# Patient Record
Sex: Female | Born: 1975 | Race: White | Hispanic: No | Marital: Single | Smoking: Former smoker
Health system: Southern US, Community
[De-identification: ages and names within clinical notes are randomized; demographics above are authoritative.]

## PROBLEM LIST (undated history)

## (undated) DIAGNOSIS — F1910 Other psychoactive substance abuse, uncomplicated: Secondary | ICD-10-CM

## (undated) DIAGNOSIS — M869 Osteomyelitis, unspecified: Secondary | ICD-10-CM

## (undated) HISTORY — PX: INCISION AND DRAINAGE FOREARM / WRIST DEEP: SUR675

## (undated) HISTORY — PX: BACK SURGERY: SHX140

---

## 2016-08-10 ENCOUNTER — Emergency Department
Admission: EM | Admit: 2016-08-10 | Discharge: 2016-08-10 | Disposition: A | Discharge status: Home / Self Care | Payer: PRIVATE HEALTH INSURANCE | Attending: Emergency Medicine | Admitting: Emergency Medicine

## 2016-08-10 ENCOUNTER — Encounter: Payer: Self-pay | Admitting: Emergency Medicine

## 2016-08-10 ENCOUNTER — Emergency Department: Payer: PRIVATE HEALTH INSURANCE

## 2016-08-10 DIAGNOSIS — M79643 Pain in unspecified hand: Secondary | ICD-10-CM

## 2016-08-10 DIAGNOSIS — F17210 Nicotine dependence, cigarettes, uncomplicated: Secondary | ICD-10-CM | POA: Diagnosis not present

## 2016-08-10 DIAGNOSIS — L02511 Cutaneous abscess of right hand: Secondary | ICD-10-CM | POA: Insufficient documentation

## 2016-08-10 DIAGNOSIS — L0291 Cutaneous abscess, unspecified: Secondary | ICD-10-CM

## 2016-08-10 DIAGNOSIS — R2231 Localized swelling, mass and lump, right upper limb: Secondary | ICD-10-CM | POA: Diagnosis present

## 2016-08-10 LAB — COMPREHENSIVE METABOLIC PANEL
ALT: 13 U/L — ABNORMAL LOW (ref 14–54)
AST: 22 U/L (ref 15–41)
Albumin: 3.6 g/dL (ref 3.5–5.0)
Alkaline Phosphatase: 70 U/L (ref 38–126)
Anion gap: 9 (ref 5–15)
BUN: 7 mg/dL (ref 6–20)
CO2: 27 mmol/L (ref 22–32)
Calcium: 9.5 mg/dL (ref 8.9–10.3)
Chloride: 102 mmol/L (ref 101–111)
Creatinine, Ser: 0.7 mg/dL (ref 0.44–1.00)
GFR calc Af Amer: >60 mL/min (ref >60)
GFR calc non Af Amer: >60 mL/min (ref >60)
Glucose, Bld: 103 mg/dL — ABNORMAL HIGH (ref 65–99)
Potassium: 4.2 mmol/L (ref 3.5–5.1)
Sodium: 138 mmol/L (ref 135–145)
Total Bilirubin: 0.5 mg/dL (ref 0.3–1.2)
Total Protein: 7.7 g/dL (ref 6.5–8.1)

## 2016-08-10 LAB — CBC WITH DIFFERENTIAL/PLATELET
Basophils Absolute: 0.1 10*3/uL (ref 0–0.1)
Basophils Relative: 1 %
Eosinophils Absolute: 0.1 10*3/uL (ref 0–0.7)
Eosinophils Relative: 2 %
HCT: 40.6 % (ref 35.0–47.0)
Hemoglobin: 14 g/dL (ref 12.0–16.0)
Lymphocytes Relative: 23 %
Lymphs Abs: 1.8 10*3/uL (ref 1.0–3.6)
MCH: 29 pg (ref 26.0–34.0)
MCHC: 34.5 g/dL (ref 32.0–36.0)
MCV: 84.3 fL (ref 80.0–100.0)
Monocytes Absolute: 0.8 10*3/uL (ref 0.2–0.9)
Monocytes Relative: 10 %
Neutro Abs: 5.2 10*3/uL (ref 1.4–6.5)
Neutrophils Relative %: 64 %
Platelets: 163 10*3/uL (ref 150–440)
RBC: 4.82 MIL/uL (ref 3.80–5.20)
RDW: 14 % (ref 11.5–14.5)
WBC: 7.9 10*3/uL (ref 3.6–11.0)

## 2016-08-10 MED ORDER — CLINDAMYCIN PHOSPHATE 600 MG/50ML IV SOLN
600.0000 mg | Freq: Once | INTRAVENOUS | Status: AC
  Administered 2016-08-10: 600 mg via INTRAVENOUS
  Filled 2016-08-10: qty 50

## 2016-08-10 MED ORDER — OXYCODONE-ACETAMINOPHEN 5-325 MG PO TABS
1.0000 | ORAL_TABLET | Freq: Once | ORAL | Status: AC
  Administered 2016-08-10: 1 via ORAL
  Filled 2016-08-10: qty 1

## 2016-08-10 MED ORDER — ONDANSETRON HCL 4 MG/2ML IJ SOLN
4.0000 mg | Freq: Once | INTRAMUSCULAR | Status: AC
  Administered 2016-08-10: 4 mg via INTRAVENOUS
  Filled 2016-08-10: qty 2

## 2016-08-10 MED ORDER — SULFAMETHOXAZOLE-TRIMETHOPRIM 800-160 MG PO TABS: 1.0000 | ORAL_TABLET | Freq: Two times a day (BID) | ORAL | 0 refills | Status: AC

## 2016-08-10 MED ORDER — FLUCONAZOLE 150 MG PO TABS: 150.0000 mg | ORAL_TABLET | Freq: Every day | ORAL | 0 refills | Status: AC

## 2016-08-10 MED ORDER — LIDOCAINE HCL 1 % IJ SOLN: 5.0000 mL | Freq: Once | INTRAMUSCULAR | Status: DC

## 2016-08-10 MED ORDER — LIDOCAINE HCL (PF) 1 % IJ SOLN: 5.0000 mL | Freq: Once | INTRAMUSCULAR | Status: DC

## 2016-08-10 MED ORDER — LIDOCAINE HCL (PF) 1 % IJ SOLN
INTRAMUSCULAR | Status: AC
  Filled 2016-08-10: qty 5

## 2016-08-10 MED ORDER — CLINDAMYCIN HCL 300 MG PO CAPS: 300.0000 mg | ORAL_CAPSULE | Freq: Three times a day (TID) | ORAL | 0 refills | Status: AC

## 2016-08-10 NOTE — ED Provider Notes (Signed)
[facility] Emergency Department Provider Note  ____________________________________________  Time seen: Approximately 4:15 PM  I have reviewed the triage vital signs and the nursing notes.   HISTORY  Chief Complaint Abscess    HPI Noni Stonesifer is a 41 y.o. female presents to the emergency department with edema and cellulitis of the right hand with a 3 cm x 3 cm indurated and fluctuant abscess that has been apparent for one day only. Patient states that she did not have symptoms last night. 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 She denies intravenous drug use. She has had some nausea but no vomiting. Patient denies associated chest pain, chest tightness and abdominal pain. She denies a history of cutaneous abscesses or cellulitis in the past. She had chills but has been afebrile.   History reviewed. No pertinent past medical history.  There are no active problems to display for this patient.   History reviewed. No pertinent surgical history.  Prior to Admission medications   Medication Sig Start Date End Date Taking? Authorizing Provider  clindamycin (CLEOCIN) 300 MG capsule Take 1 capsule (300 mg total) by mouth 3 (three) times daily. 08/10/16 08/20/16  Orvil Feil, PA-C  fluconazole (DIFLUCAN) 150 MG tablet Take 1 tablet (150 mg total) by mouth daily. 08/10/16 08/11/16  Orvil Feil, PA-C  sulfamethoxazole-trimethoprim (BACTRIM DS,SEPTRA DS) 800-160 MG tablet Take 1 tablet by mouth 2 (two) times daily. 08/10/16 08/17/16  Orvil Feil, PA-C    Allergies Patient has no known allergies.  No family history on file.  Social History Social History  Substance Use Topics  . Smoking status: Current Every Day Smoker    Types: Cigarettes  . Smokeless tobacco: Never Used  . Alcohol use No     Review of Systems  Constitutional: Patient has chills. Eyes: No visual changes. No discharge ENT: No upper respiratory complaints. Cardiovascular: no  chest pain. Respiratory: no cough. No SOB. Gastrointestinal: No abdominal pain. She has had nausea but no vomiting. Musculoskeletal: Negative for musculoskeletal pain. Skin: Patient has cellulitis and edema of the right hand with possible abscess. Neurological: Negative for headaches, focal weakness or numbness.   ____________________________________________   PHYSICAL EXAM:  VITAL SIGNS: ED Triage Vitals  Enc Vitals Group     BP 08/10/16 1540 (!) 117/96     Pulse Rate 08/10/16 1540 77     Resp 08/10/16 1540 16     Temp 08/10/16 1540 98.1 F (36.7 C)     Temp Source 08/10/16 1540 Oral     SpO2 08/10/16 1540 98 %     Weight 08/10/16 1539 125 lb (56.7 kg)     Height 08/10/16 1539 5\' 7"  (1.702 m)     Head Circumference --      Peak Flow --      Pain Score 08/10/16 1539 8     Pain Loc --      Pain Edu? --      Excl. in GC? --      Constitutional: Alert and oriented. Well appearing and in no acute distress. Eyes: Conjunctivae are normal. PERRL. EOMI. Head: Atraumatic. Cardiovascular: Normal rate, regular rhythm. Normal S1 and S2.  Good peripheral circulation. Respiratory: Normal respiratory effort without tachypnea or retractions. Lungs CTAB. Good air entry to the bases with no decreased or absent breath sounds. Musculoskeletal: Full range of motion to all extremities. No gross deformities appreciated. Neurologic:  Normal speech and language. No gross focal neurologic deficits are appreciated.  Skin: Patient has a 3 cm x 3 cm palpable abscess of the right hand with surrounding cellulitis and edema. Palpable radial and ulnar pulses bilaterally and symmetrically. Psychiatric: Mood and affect are normal. Speech and behavior are normal. Patient exhibits appropriate insight and judgement.   ____________________________________________   LABS (all labs ordered are listed, but only abnormal results are displayed)  Labs Reviewed  COMPREHENSIVE METABOLIC PANEL - Abnormal; Notable  for the following:       Result Value   Glucose, Bld 103 (*)    ALT 13 (*)    All other components within normal limits  CBC WITH DIFFERENTIAL/PLATELET   ____________________________________________  EKG   ____________________________________________  RADIOLOGY Geraldo PitterI, Tasean Mancha M Carlicia Leavens, personally viewed and evaluated these images (plain radiographs) as part of my medical decision making, as well as reviewing the written report by the radiologist.    [place] Rt Upper Extrem Ltd Soft Tissue Non Vascular  Result Date: 08/10/2016 CLINICAL DATA:  Right hand soreness and swelling beginning yesterday. EXAM: ULTRASOUND RIGHT HAND LIMITED TECHNIQUE: Ultrasound examination of the upper extremity soft tissues was performed in the area of clinical concern. COMPARISON:  None. FINDINGS: Scanning was directed to the region of concern on the dorsal aspect of the right hand at approximately the level of the fourth and fifth metacarpals. A well-circumscribed lesion which is predominantly hypoechoic is identified which measures 2.5 cm craniocaudal by 1.0 cm AP by 1.9 cm transverse. Doppler imaging demonstrates flow about but not within the lesion. IMPRESSION: Well-circumscribed lesion on the dorsum of the right hand could be a hematoma, abscess or soft tissue mass without internal blood flow. It should be amenable to needle aspiration. Electronically Signed   By: Drusilla Kannerhomas  Dalessio M.D.   On: 08/10/2016 16:58    ____________________________________________    PROCEDURES  Procedure(s) performed:    Procedures    Medications  lidocaine (PF) (XYLOCAINE) 1 % injection 5 mL (not administered)  clindamycin (CLEOCIN) IVPB 600 mg (0 mg Intravenous Stopped 08/10/16 1740)  oxyCODONE-acetaminophen (PERCOCET/ROXICET) 5-325 MG per tablet 1 tablet (1 tablet Oral Given 08/10/16 1655)  ondansetron (ZOFRAN) injection 4 mg (4 mg Intravenous Given 08/10/16 1702)   INCISION AND DRAINAGE Performed by: Orvil FeilJaclyn M Kaedence Connelly Consent:  Verbal consent obtained. Risks and benefits: risks, benefits and alternatives were discussed Type: abscess  Body area: Right hand  Anesthesia: local infiltration  Incision was made with a scalpel.  Local anesthetic: lidocaine 1% without epinephrine  Anesthetic total: 3 ml  Complexity: complex Blunt dissection to break up loculations  Drainage: purulent  Drainage amount: 5 cc  Packing material: 1/4 in iodoform gauze  Patient tolerance: Patient tolerated the procedure well with no immediate complications.     ____________________________________________   INITIAL IMPRESSION / ASSESSMENT AND PLAN / ED COURSE  Pertinent labs & imaging results that were available during my care of the patient were reviewed by me and considered in my medical decision making (see chart for details).  Review of the [place] CSRS was performed in accordance of the [facility] prior to dispensing any controlled drugs.     Assessment and plan: Hand Pain,  Abscess  Patient presents to the emergency department with cellulitis of the right hand, edema of the right hand and a 3 cm x 3 cm palpable abscess at the dorsal aspect of the right hand. Im clindamycin was administered in the emergency department. CBC and CMP were reassuring. Patient underwent incision and drainage without complication. Patient was advised to have packing removed by  primary care in three days. Patient was discharged with both Bactrim and Clindamycin. Patient was advised to follow-up with primary care as needed. All patient questions were answered. Vital signs are reassuring prior to discharge.  ____________________________________________  FINAL CLINICAL IMPRESSION(S) / ED DIAGNOSES  Final diagnoses:  Hand pain  Abscess      NEW MEDICATIONS STARTED DURING THIS VISIT:  New Prescriptions   CLINDAMYCIN (CLEOCIN) 300 MG CAPSULE    Take 1 capsule (300 mg total) by mouth 3 (three) times daily.   FLUCONAZOLE (DIFLUCAN) 150 MG TABLET     Take 1 tablet (150 mg total) by mouth daily.   SULFAMETHOXAZOLE-TRIMETHOPRIM (BACTRIM DS,SEPTRA DS) 800-160 MG TABLET    Take 1 tablet by mouth 2 (two) times daily.        This chart was dictated using voice recognition software/Dragon. Despite best efforts to proofread, errors can occur which can change the meaning. Any change was purely unintentional.    Orvil Feil, PA-C 08/10/16 Gwenyth Bender, MD 08/10/16 1910

## 2016-08-10 NOTE — ED Triage Notes (Signed)
Abscess to right hand x 1 day.  STates has an allergy to the sanitizer at [employer].  Noticed that existing scar on right had was sore and then swelling started yesterday.

## 2016-12-09 ENCOUNTER — Other Ambulatory Visit: Payer: Self-pay

## 2016-12-09 ENCOUNTER — Emergency Department: Payer: Medicaid - Out of State

## 2016-12-09 ENCOUNTER — Emergency Department
Admission: EM | Admit: 2016-12-09 | Discharge: 2016-12-09 | Disposition: A | Discharge status: Home / Self Care | Payer: Medicaid - Out of State | Attending: Emergency Medicine | Admitting: Emergency Medicine

## 2016-12-09 DIAGNOSIS — L0889 Other specified local infections of the skin and subcutaneous tissue: Secondary | ICD-10-CM | POA: Diagnosis not present

## 2016-12-09 DIAGNOSIS — B958 Unspecified staphylococcus as the cause of diseases classified elsewhere: Secondary | ICD-10-CM | POA: Insufficient documentation

## 2016-12-09 DIAGNOSIS — F17210 Nicotine dependence, cigarettes, uncomplicated: Secondary | ICD-10-CM | POA: Diagnosis not present

## 2016-12-09 DIAGNOSIS — M85661 Other cyst of bone, right lower leg: Secondary | ICD-10-CM | POA: Diagnosis not present

## 2016-12-09 DIAGNOSIS — L089 Local infection of the skin and subcutaneous tissue, unspecified: Secondary | ICD-10-CM

## 2016-12-09 DIAGNOSIS — R21 Rash and other nonspecific skin eruption: Secondary | ICD-10-CM | POA: Diagnosis present

## 2016-12-09 LAB — CBC WITH DIFFERENTIAL/PLATELET
BASOS ABS: 0.1 10*3/uL (ref 0–0.1)
Basophils Relative: 1 %
EOS PCT: 5 %
Eosinophils Absolute: 0.3 10*3/uL (ref 0–0.7)
HEMATOCRIT: 36.4 % (ref 35.0–47.0)
HEMOGLOBIN: 12 g/dL (ref 12.0–16.0)
LYMPHS ABS: 1.9 10*3/uL (ref 1.0–3.6)
LYMPHS PCT: 31 %
MCH: 28 pg (ref 26.0–34.0)
MCHC: 33.1 g/dL (ref 32.0–36.0)
MCV: 84.6 fL (ref 80.0–100.0)
Monocytes Absolute: 0.5 10*3/uL (ref 0.2–0.9)
Monocytes Relative: 8 %
NEUTROS ABS: 3.5 10*3/uL (ref 1.4–6.5)
Neutrophils Relative %: 55 %
Platelets: 154 10*3/uL (ref 150–440)
RBC: 4.3 MIL/uL (ref 3.80–5.20)
RDW: 14.3 % (ref 11.5–14.5)
WBC: 6.2 10*3/uL (ref 3.6–11.0)

## 2016-12-09 LAB — COMPREHENSIVE METABOLIC PANEL
ALBUMIN: 3.7 g/dL (ref 3.5–5.0)
ALT: 15 U/L (ref 14–54)
ANION GAP: 9 (ref 5–15)
AST: 24 U/L (ref 15–41)
Alkaline Phosphatase: 59 U/L (ref 38–126)
BILIRUBIN TOTAL: 0.4 mg/dL (ref 0.3–1.2)
BUN: 12 mg/dL (ref 6–20)
CHLORIDE: 99 mmol/L — AB (ref 101–111)
CO2: 26 mmol/L (ref 22–32)
Calcium: 9 mg/dL (ref 8.9–10.3)
Creatinine, Ser: 0.82 mg/dL (ref 0.44–1.00)
GFR calc Af Amer: >60 mL/min (ref >60)
Glucose, Bld: 158 mg/dL — ABNORMAL HIGH (ref 65–99)
POTASSIUM: 3.7 mmol/L (ref 3.5–5.1)
Sodium: 134 mmol/L — ABNORMAL LOW (ref 135–145)
TOTAL PROTEIN: 7.1 g/dL (ref 6.5–8.1)

## 2016-12-09 LAB — LACTIC ACID, PLASMA: Lactic Acid, Venous: 1.2 mmol/L (ref 0.5–1.9)

## 2016-12-09 LAB — URINALYSIS, COMPLETE (UACMP) WITH MICROSCOPIC
BILIRUBIN URINE: NEGATIVE
Bacteria, UA: NONE SEEN
Glucose, UA: NEGATIVE mg/dL
HGB URINE DIPSTICK: NEGATIVE
Ketones, ur: NEGATIVE mg/dL
LEUKOCYTES UA: NEGATIVE
NITRITE: NEGATIVE
PH: 8 (ref 5.0–8.0)
PROTEIN: NEGATIVE mg/dL
Specific Gravity, Urine: 1.014 (ref 1.005–1.030)
WBC UA: NONE SEEN WBC/hpf (ref 0–5)

## 2016-12-09 LAB — URINE DRUG SCREEN, QUALITATIVE (ARMC ONLY)
AMPHETAMINES, UR SCREEN: NOT DETECTED
BENZODIAZEPINE, UR SCRN: NOT DETECTED
Barbiturates, Ur Screen: NOT DETECTED
COCAINE METABOLITE, UR ~~LOC~~: POSITIVE — AB
Cannabinoid 50 Ng, Ur ~~LOC~~: NOT DETECTED
MDMA (ECSTASY) UR SCREEN: NOT DETECTED
METHADONE SCREEN, URINE: NOT DETECTED
OPIATE, UR SCREEN: NOT DETECTED
PHENCYCLIDINE (PCP) UR S: NOT DETECTED
Tricyclic, Ur Screen: NOT DETECTED

## 2016-12-09 LAB — ACETAMINOPHEN LEVEL: Acetaminophen (Tylenol), Serum: <10 ug/mL — ABNORMAL LOW (ref 10–30)

## 2016-12-09 LAB — SALICYLATE LEVEL: Salicylate Lvl: <7 mg/dL (ref 2.8–30.0)

## 2016-12-09 LAB — ETHANOL: Alcohol, Ethyl (B): <10 mg/dL (ref <10)

## 2016-12-09 LAB — POCT PREGNANCY, URINE: PREG TEST UR: NEGATIVE

## 2016-12-09 IMAGING — CT CT MAXILLOFACIAL W/ CM
3 of 4 series · 16 of 47 positions shown, 19 images · IV contrast (iopamidol)
Comparison: None.

CLINICAL DATA: Facial swelling

EXAM:
CT MAXILLOFACIAL WITH CONTRAST
TECHNIQUE: Multidetector CT imaging of the maxillofacial structures was
performed with intravenous contrast. Multiplanar CT image
reconstructions were also generated.
CONTRAST:  75mL [KB] IOPAMIDOL ([KB]) INJECTION 61%

[Series 2: max soft · axial · 0.35mm/px · z∈[-258,-118]mm · 10 of 82 slices shown, 13 images]
[im 6/82  brain]
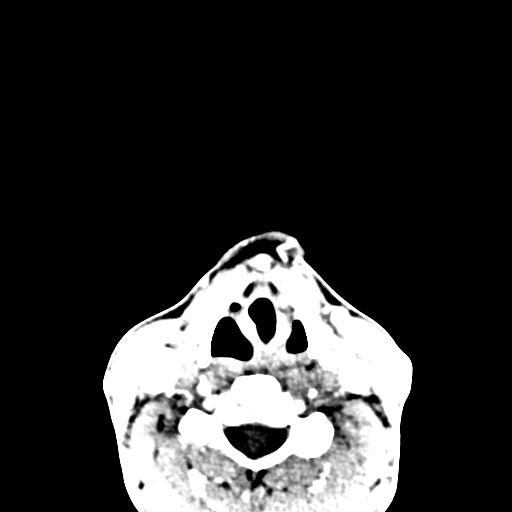
[im 6/82  bone]
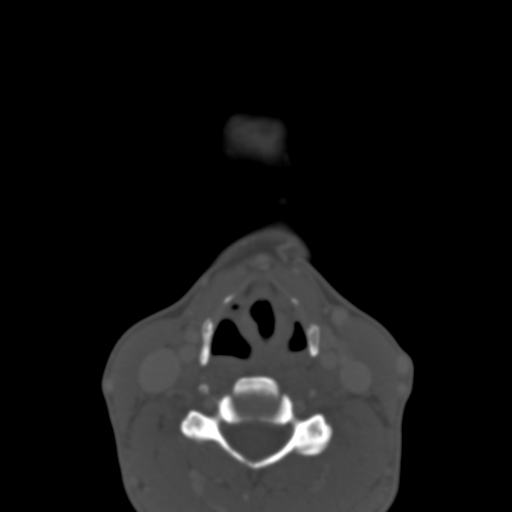
[im 14/82  bone]
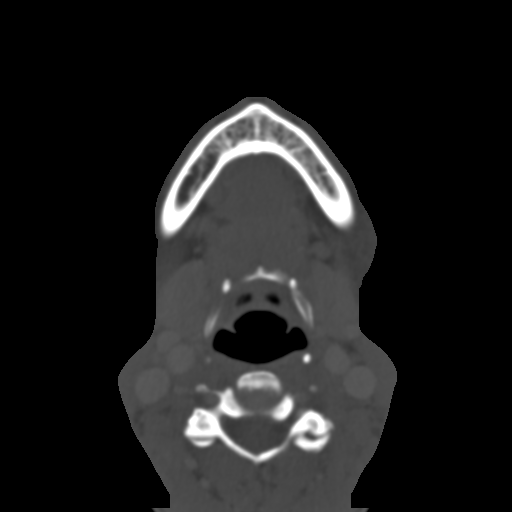
[im 23/82  bone]
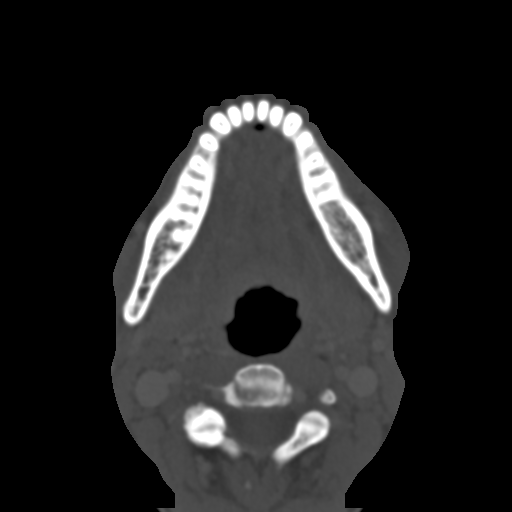
[im 28/82  bone]
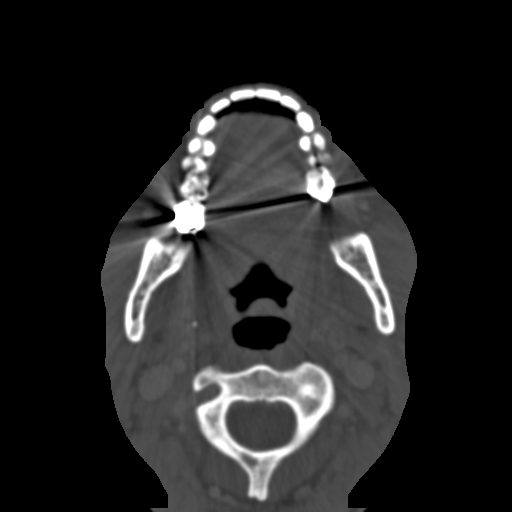
[im 37/82  brain]
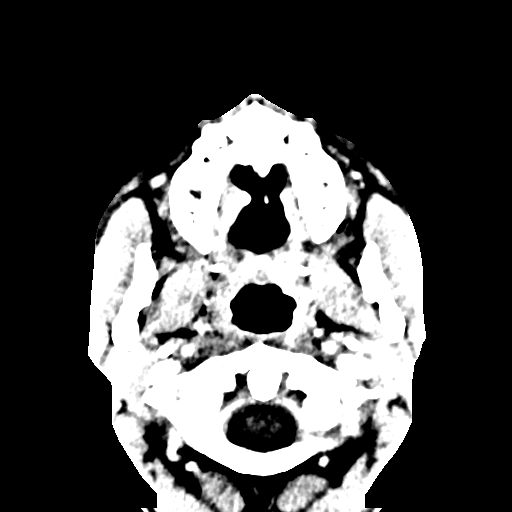
[im 37/82  bone]
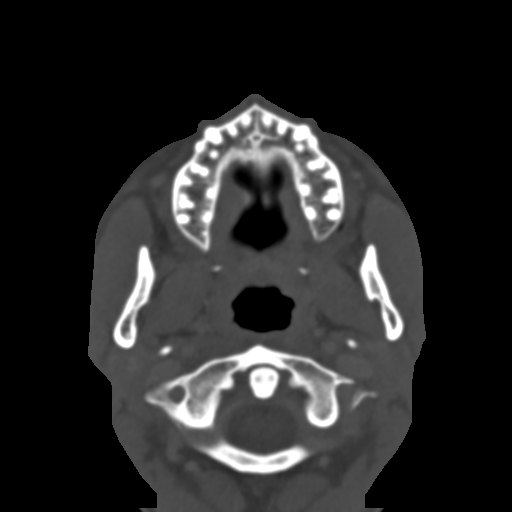
[im 45/82  bone]
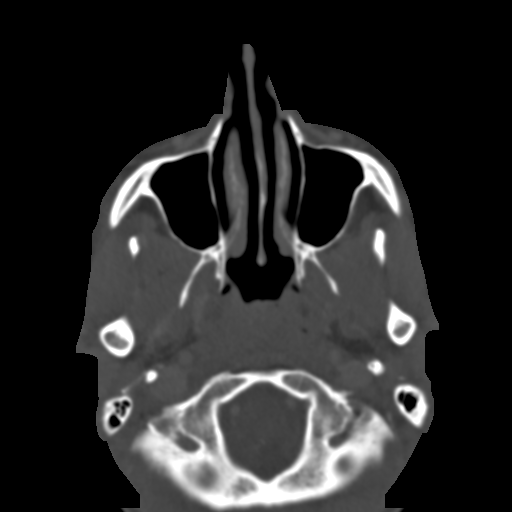
[im 54/82  bone]
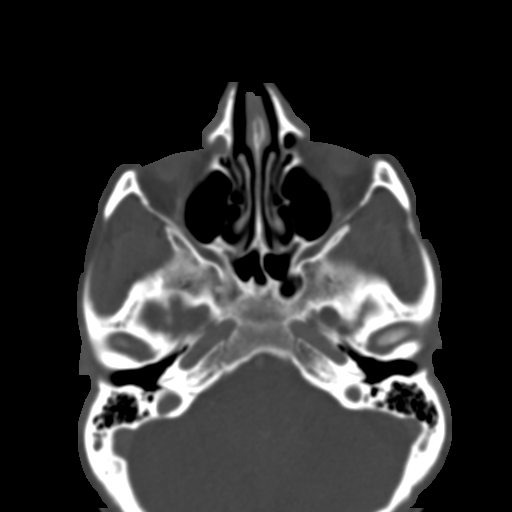
[im 62/82  bone]
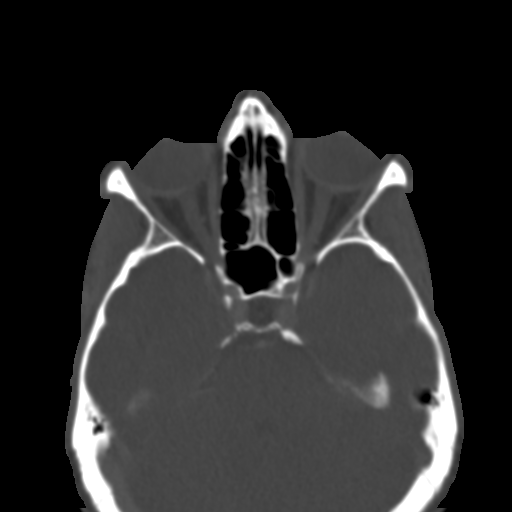
[im 68/82  brain]
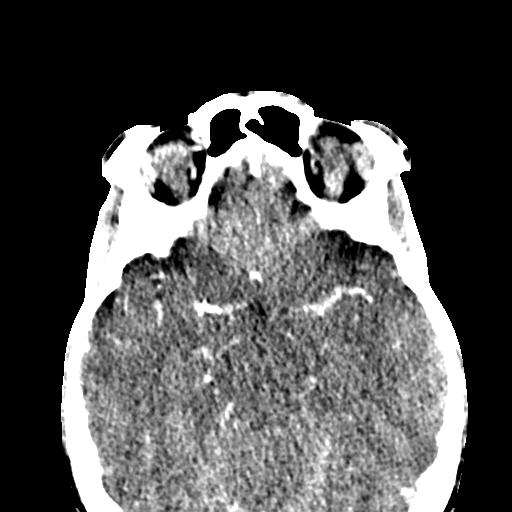
[im 68/82  bone]
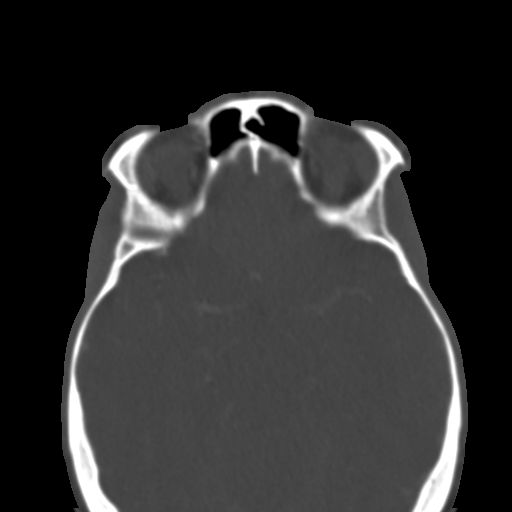
[im 76/82  bone]
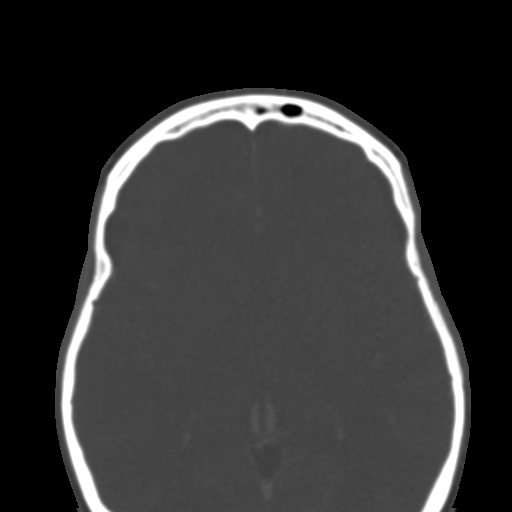

[Series 5: sagittal soft · sagittal · 0.31mm/px · 3 of 73 slices shown]
[im 25/73  bone]
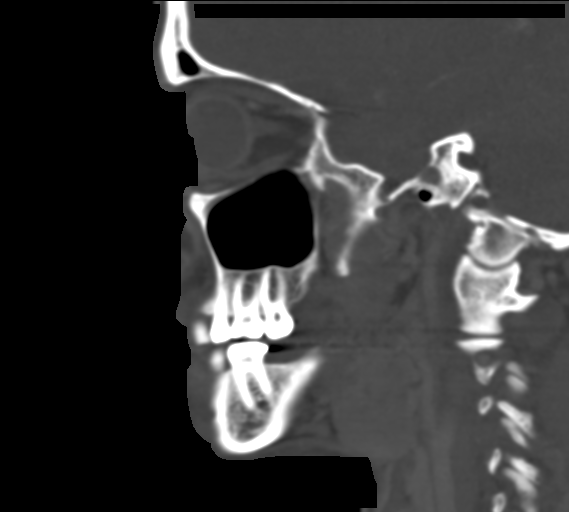
[im 37/73  bone]
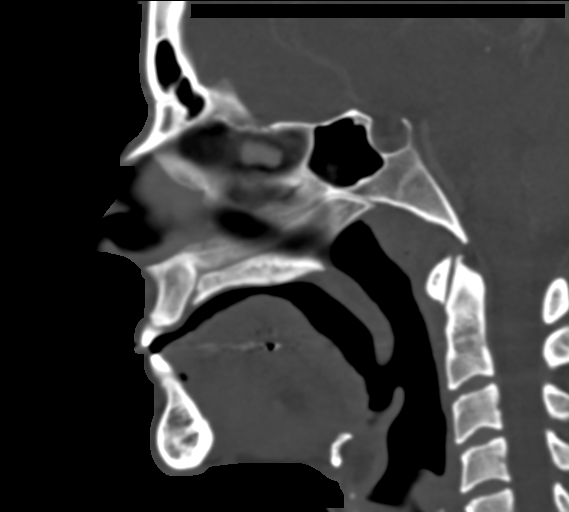
[im 49/73  bone]
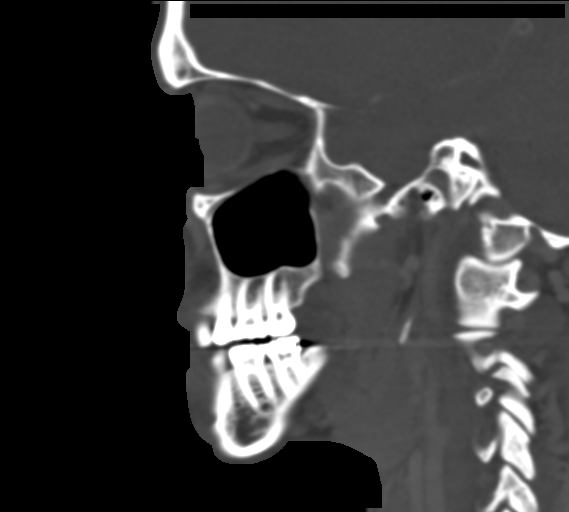

[Series 6: coronal bone · coronal · 0.31mm/px · 3 of 79 slices shown]
[im 20/79  bone]
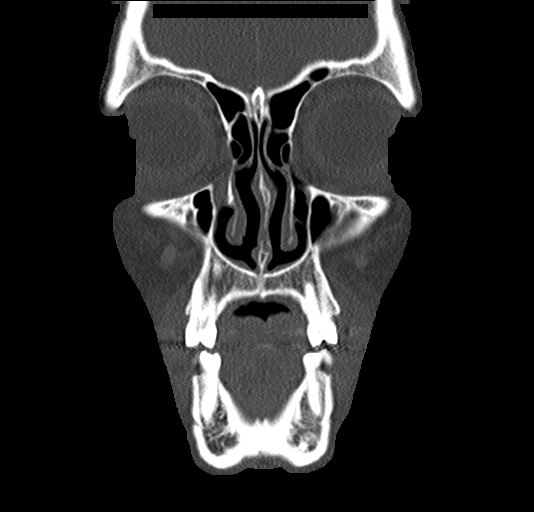
[im 40/79  bone]
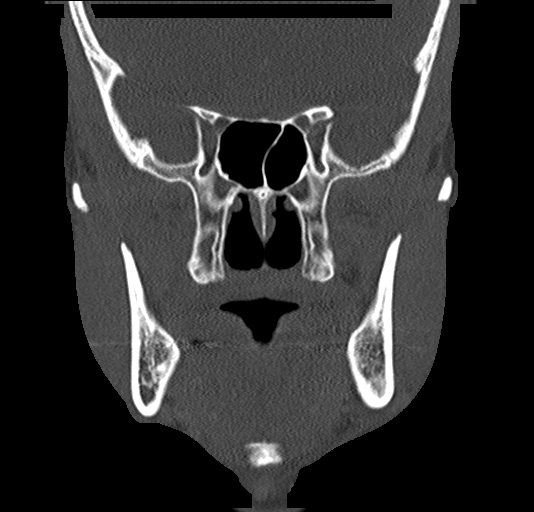
[im 59/79  bone]
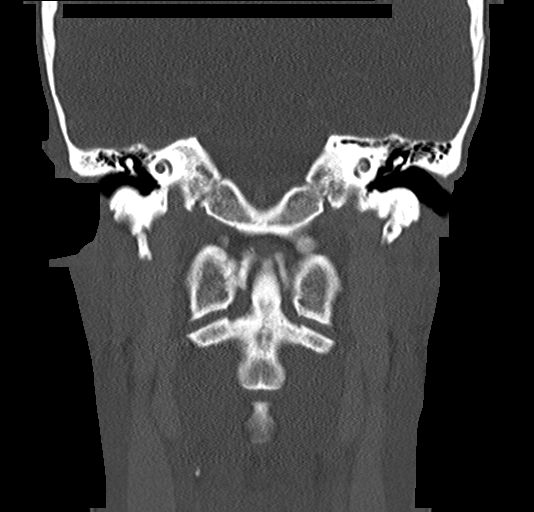

[16 of 47 positions shown; findings below may reference images not displayed]

FINDINGS: Osseous:  No acute fracture.

Orbits: The globes appear intact. Normal appearance of the intra-
and extraconal fat. Symmetric extraocular muscles.

Sinuses: No fluid levels or advanced mucosal thickening.

Soft tissues: Inflammatory change in the subcutaneous fat with
overlying skin thickening at the left lower face, superficial to the
angle of the mandible and at the right face, superficial to the
zygomatic arch. There is no associated fluid collection or abscess.

Limited intracranial: Normal.
IMPRESSION: Focal areas of superficial facial inflammation overlying the right
zygomatic arch and left mandibular angle. No associated abscess or
fluid collection.

## 2016-12-09 IMAGING — DX DG KNEE COMPLETE 4+V*R*
4 series · 4 of 4 positions shown · non-contrast
Comparison: None.

CLINICAL DATA: 40-year-old female with right knee pain.

EXAM:
RIGHT KNEE - COMPLETE 4+ VIEW

[knee ap]
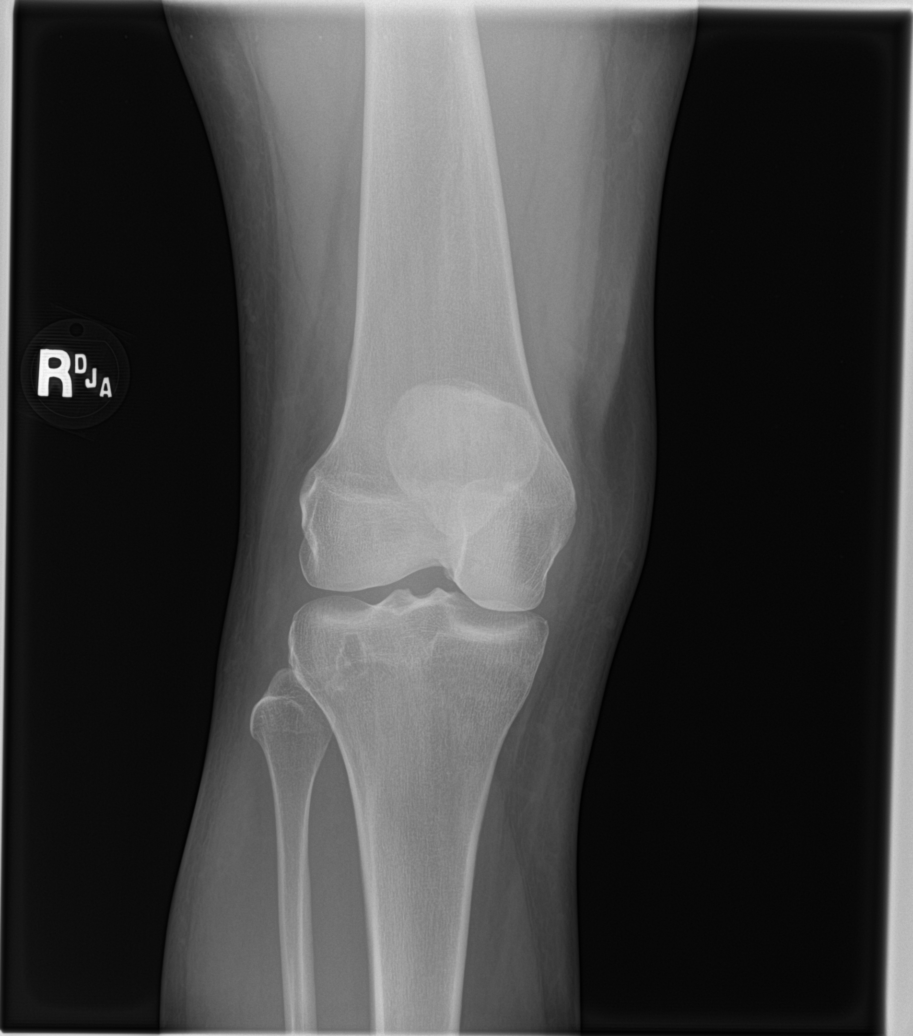

[knee lat]
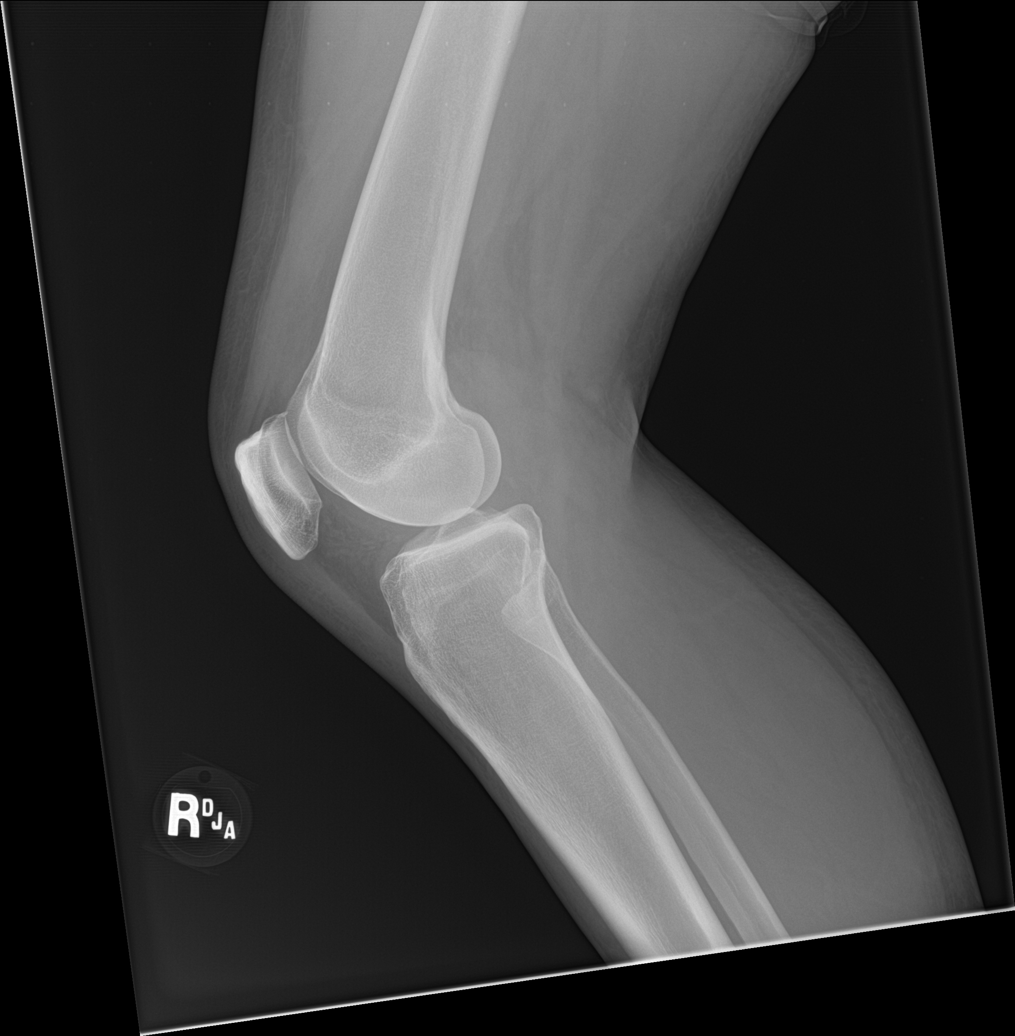

[knee obl (1 of 2)]
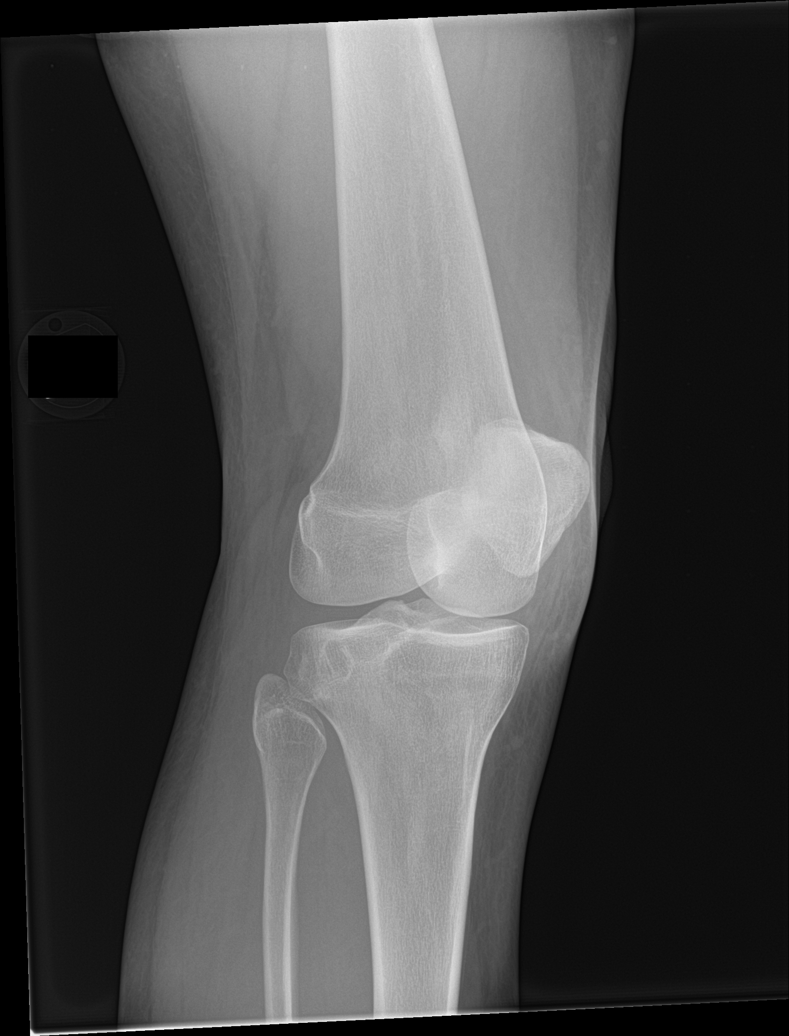

[knee obl (2 of 2)]
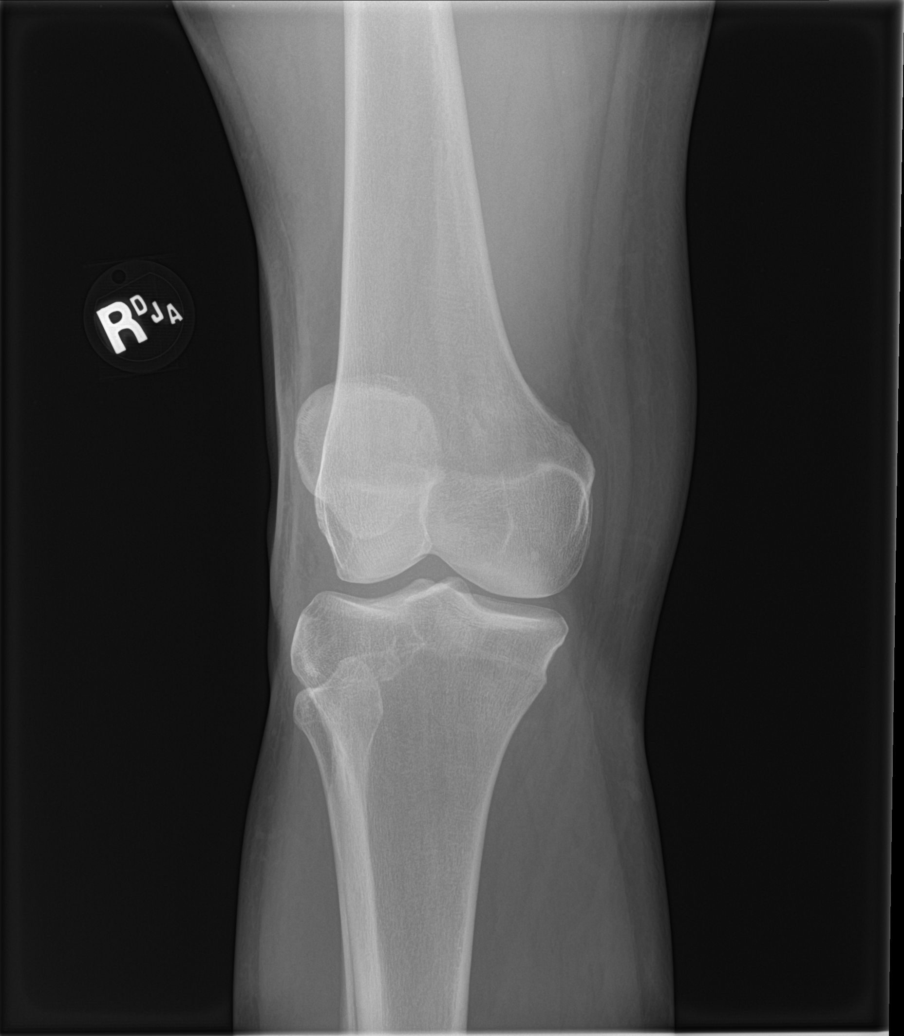

[4 of 4 positions shown; findings below may reference images not displayed]

FINDINGS: There is no acute fracture or dislocation. No arthritic changes.
There is a 9 x 12 mm lucent lesion with sclerotic margins in the
epiphysis of the along the lateral tibial plateau which may
represent a cyst or a geode. Infection is less likely. No joint
effusion. The soft tissues appear unremarkable.
IMPRESSION: 1. No acute fracture or dislocation.
2. Probable cyst or geode in the lateral tibial plateau.

## 2016-12-09 MED ORDER — SODIUM CHLORIDE 0.9 % IV BOLUS (SEPSIS)
1000.0000 mL | Freq: Once | INTRAVENOUS | Status: AC
  Administered 2016-12-09: 1000 mL via INTRAVENOUS

## 2016-12-09 MED ORDER — CLINDAMYCIN HCL 300 MG PO CAPS: 300.0000 mg | ORAL_CAPSULE | Freq: Four times a day (QID) | ORAL | 0 refills | Status: AC

## 2016-12-09 MED ORDER — SULFAMETHOXAZOLE-TRIMETHOPRIM 800-160 MG PO TABS: 1.0000 | ORAL_TABLET | Freq: Two times a day (BID) | ORAL | 0 refills | Status: DC

## 2016-12-09 MED ORDER — SODIUM CHLORIDE 0.9 % IV SOLN
3.0000 g | Freq: Once | INTRAVENOUS | Status: AC
  Administered 2016-12-09: 3 g via INTRAVENOUS
  Filled 2016-12-09: qty 3

## 2016-12-09 MED ORDER — IOPAMIDOL (ISOVUE-300) INJECTION 61%
75.0000 mL | Freq: Once | INTRAVENOUS | Status: AC | PRN
  Administered 2016-12-09: 75 mL via INTRAVENOUS
  Filled 2016-12-09: qty 75

## 2016-12-09 MED ORDER — MELOXICAM 15 MG PO TABS: 15.0000 mg | ORAL_TABLET | Freq: Every day | ORAL | 0 refills | Status: DC

## 2016-12-09 NOTE — ED Notes (Signed)
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 States "revisited it" more recently. 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   Hx of abscess to hands that required antibiotics.

## 2016-12-09 NOTE — ED Provider Notes (Signed)
[facility] Emergency Department Provider Note  ____________________________________________  Time seen: Approximately 8:44 PM  I have reviewed the triage vital signs and the nursing notes.   HISTORY  Chief Complaint Rash    HPI Susan Terry is a 41 y.o. female who presents emergency department complaining of multiple complaints.  Patient has skin lesions to the face and posterior thorax that have been increasingly worsening.  Patient reports the areas are painful, draining pustular material.  These have been present for anywhere from 1 week to 3 weeks.  Patient is also complaining of nausea and sharp right knee pain.  Patient denies any trauma to the knee.  It is sharp, intermittent, but severe when present.  Patient denies any gross erythema or edema to the knee.  No loss of range of motion.  Nausea is constant, no emesis.  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  She has had abscess from the injection site in the past that was treated with clindamycin and Bactrim.  Patient denies any fevers or chills, nausea or vomiting.  She denies any headache, visual changes, chest pain, shortness of breath, abdominal pain, diarrhea or constipation.  History reviewed. No pertinent past medical history.  There are no active problems to display for this patient.   History reviewed. No pertinent surgical history.  Prior to Admission medications   Medication Sig Start Date End Date Taking? Authorizing Provider  clindamycin (CLEOCIN) 300 MG capsule Take 1 capsule (300 mg total) 4 (four) times daily for 10 days by mouth. 12/09/16 12/19/16  Jeannelle Wiens, Delorise RoyalsJonathan D, PA-C  meloxicam (MOBIC) 15 MG tablet Take 1 tablet (15 mg total) daily by mouth. 12/09/16   Alonza Knisley, Delorise RoyalsJonathan D, PA-C  sulfamethoxazole-trimethoprim (BACTRIM DS,SEPTRA DS) 800-160 MG tablet Take 1 tablet 2 (two) times daily by mouth. 12/09/16   Love Milbourne, Delorise RoyalsJonathan D, PA-C    Allergies Patient has no known  allergies.  History reviewed. No pertinent family history.  Social History Social History   Tobacco Use  . Smoking status: Current Every Day Smoker    Types: Cigarettes  . Smokeless tobacco: Never Used  Substance Use Topics  . Alcohol use: No  . Drug use: No     Review of Systems  Constitutional: No fever/chills Eyes: No visual changes. ENT: No upper respiratory complaints. Cardiovascular: no chest pain. Respiratory: no cough. No SOB. Gastrointestinal: No abdominal pain.  Positive for nausea but denies vomiting.  No diarrhea.  No constipation. Genitourinary: Negative for dysuria. No hematuria Musculoskeletal: Positive for sharp right knee pain. Skin: Negative for rash, abrasions, lacerations, ecchymosis. Neurological: Negative for headaches, focal weakness or numbness. 10-point ROS otherwise negative.  ____________________________________________   PHYSICAL EXAM:  VITAL SIGNS: ED Triage Vitals  Enc Vitals Group     BP      Pulse      Resp      Temp      Temp src      SpO2      Weight      Height      Head Circumference      Peak Flow      Pain Score      Pain Loc      Pain Edu?      Excl. in GC?      Constitutional: Alert and oriented. Well appearing and in no acute distress. Eyes: Conjunctivae are normal. PERRL. EOMI. Head: 2 significant lesions noted to the face.  First lesion in his right cheek.  Deep excoriation  into the skin with no pustular drainage.  Surrounding erythema and edema.  Area is very tender to palpation.  Second lesion in the left mandibular region.  Erythema, edema noted.  Central area with scabbing but no drainage.  No gross fluctuance or induration palpation does not express any pustular drainage. ENT:      Ears:       Nose: No congestion/rhinnorhea.      Mouth/Throat: Mucous membranes are moist.  Neck: No stridor.  Neck is supple full range of motion. Hematological/Lymphatic/Immunilogical: No cervical  lymphadenopathy. Cardiovascular: Normal rate, regular rhythm. Normal S1 and S2.  Good peripheral circulation. Respiratory: Normal respiratory effort without tachypnea or retractions. Lungs CTAB. Good air entry to the bases with no decreased or absent breath sounds. Gastrointestinal: Bowel sounds 4 quadrants. Soft and nontender to palpation. No guarding or rigidity. No palpable masses. No distention. No CVA tenderness. Musculoskeletal: Full range of motion to all extremities. No gross deformities appreciated.  No gross deformity, erythema, edema noted to the right knee.  Full range of motion.  Varus, valgus, Lachman's, McMurray's is negative.  Dorsalis pedis pulse and sensation intact distally.   Neurologic:  Normal speech and language. No gross focal neurologic deficits are appreciated.  Skin:  Skin is warm, dry and intact. No rash noted. Psychiatric: Mood and affect are normal. Speech and behavior are normal. Patient exhibits appropriate insight and judgement.   ____________________________________________   LABS (all labs ordered are listed, but only abnormal results are displayed)  Labs Reviewed  COMPREHENSIVE METABOLIC PANEL - Abnormal; Notable for the following components:      Result Value   Sodium 134 (*)    Chloride 99 (*)    Glucose, Bld 158 (*)    All other components within normal limits  URINE DRUG SCREEN, QUALITATIVE ([facility] ONLY) - Abnormal; Notable for the following components:   Cocaine Metabolite,Ur [place] POSITIVE (*)    All other components within normal limits  URINALYSIS, COMPLETE (UACMP) WITH MICROSCOPIC - Abnormal; Notable for the following components:   Color, Urine YELLOW (*)    APPearance CLOUDY (*)    Squamous Epithelial / LPF 6-30 (*)    All other components within normal limits  ACETAMINOPHEN LEVEL - Abnormal; Notable for the following components:   Acetaminophen (Tylenol), Serum <10 (*)    All other components within normal limits  CULTURE, BLOOD (ROUTINE X  2)  CULTURE, BLOOD (ROUTINE X 2)  AEROBIC/ANAEROBIC CULTURE (SURGICAL/DEEP WOUND)  CBC WITH DIFFERENTIAL/PLATELET  LACTIC ACID, PLASMA  ETHANOL  SALICYLATE LEVEL  LACTIC ACID, PLASMA  POC URINE PREG, ED  POCT PREGNANCY, URINE   ____________________________________________  EKG   ____________________________________________  RADIOLOGY Festus Barren Elianis Fischbach, personally viewed and evaluated these images as part of my medical decision making, as well as reviewing the written report by the radiologist.  Ct Maxillofacial W Contrast  Result Date: 12/09/2016 CLINICAL DATA:  Facial swelling EXAM: CT MAXILLOFACIAL WITH CONTRAST TECHNIQUE: Multidetector CT imaging of the maxillofacial structures was performed with intravenous contrast. Multiplanar CT image reconstructions were also generated. CONTRAST:  75mL ISOVUE-300 IOPAMIDOL (ISOVUE-300) INJECTION 61% COMPARISON:  None. FINDINGS: Osseous:  No acute fracture. Orbits: The globes appear intact. Normal appearance of the intra- and extraconal fat. Symmetric extraocular muscles. Sinuses: No fluid levels or advanced mucosal thickening. Soft tissues: Inflammatory change in the subcutaneous fat with overlying skin thickening at the left lower face, superficial to the angle of the mandible and at the right face, superficial to the zygomatic arch. There  is no associated fluid collection or abscess. Limited intracranial: Normal. IMPRESSION: Focal areas of superficial facial inflammation overlying the right zygomatic arch and left mandibular angle. No associated abscess or fluid collection. Electronically Signed   By: Deatra RobinsonKevin  Herman M.D.   On: 12/09/2016 22:46   Dg Knee Complete 4 Views Right  Result Date: 12/09/2016 CLINICAL DATA:  41 year old female with right knee pain. EXAM: RIGHT KNEE - COMPLETE 4+ VIEW COMPARISON:  None. FINDINGS: There is no acute fracture or dislocation. No arthritic changes. There is a 9 x 12 mm lucent lesion with sclerotic  margins in the epiphysis of the along the lateral tibial plateau which may represent a cyst or a geode. Infection is less likely. No joint effusion. The soft tissues appear unremarkable. IMPRESSION: 1. No acute fracture or dislocation. 2. Probable cyst or geode in the lateral tibial plateau. Electronically Signed   By: Elgie CollardArash  Radparvar M.D.   On: 12/09/2016 21:49    ____________________________________________    PROCEDURES  Procedure(s) performed:    Procedures    Medications  sodium chloride 0.9 % bolus 1,000 mL (0 mLs Intravenous Stopped 12/09/16 2337)  iopamidol (ISOVUE-300) 61 % injection 75 mL (75 mLs Intravenous Contrast Given 12/09/16 2231)  Ampicillin-Sulbactam (UNASYN) 3 g in sodium chloride 0.9 % 100 mL IVPB (0 g Intravenous Stopped 12/09/16 2337)     ____________________________________________   INITIAL IMPRESSION / ASSESSMENT AND PLAN / ED COURSE  Pertinent labs & imaging results that were available during my care of the patient were reviewed by me and considered in my medical decision making (see chart for details).  Review of the [place] CSRS was performed in accordance of the [facility] prior to dispensing any controlled drugs.  Clinical Course as of Dec 10 2347  Tue Dec 09, 2016  2126 Patient presents the emergency department with multiple skin lesions.  These have been in process for the past 7-21 days.  Areas are painful, draining.  Patient is also complaining of nausea and right knee pain.  Complicating history, patient is IV heroin user and has had abscesses in the past.  Patient denies any fever or chills, difficulty breathing or swallowing, chest pain, abdominal pain.  Patient does have nausea with no emesis, diarrhea or constipation.  Nontraumatic right knee pain.  At this time, differential includes most likely, topical staph versus MRSA infection.  Patient also has a differential including sepsis, systemic bacterial infection, septic arthritis.  Wound culture, drug  screen, lactic, blood cultures, basic lab work is ordered.  X-ray of the knee and CT scan of the face were also ordered for further evaluation.  [JC]    Clinical Course User Index [JC] Francella Barnett, Delorise RoyalsJonathan D, PA-C    Patient's diagnosis is consistent with staph infection of the face and right knee pain.  Patient presented with worsening infection to the face, left shoulder.  She also endorsed some nausea and right knee pain.  Patient is an IV drug user and as such there was some concern for systemic infection.  Labs returned with reassuring results.  X-ray reveals a small cyst consistent with geode.  CT scan of the face reveals cellulitic changes with no appreciable abscess requiring incision and drainage.  Patient was given Unasyn IV in the emergency department and discharged with clindamycin and Bactrim.  Patient is to follow-up with primary care as well as orthopedics.  Patient is given strict instructions to return to the emergency department.  Patient is given extreme caution regarding use of IV drugs  to include localized infections, systemic infection, and pericarditis..  Patient is given ED precautions to return to the ED for any worsening or new symptoms.     ____________________________________________  FINAL CLINICAL IMPRESSION(S) / ED DIAGNOSES  Final diagnoses:  Staph skin infection  Bone cyst of right tibia      NEW MEDICATIONS STARTED DURING THIS VISIT:  ED Discharge Orders        Ordered    clindamycin (CLEOCIN) 300 MG capsule  4 times daily     12/09/16 2326    sulfamethoxazole-trimethoprim (BACTRIM DS,SEPTRA DS) 800-160 MG tablet  2 times daily     12/09/16 2326    meloxicam (MOBIC) 15 MG tablet  Daily     12/09/16 2326          This chart was dictated using voice recognition software/Dragon. Despite best efforts to proofread, errors can occur which can change the meaning. Any change was purely unintentional.    Racheal Patches, PA-C 12/09/16 2349     Myrna Blazer, MD 12/10/16 [phone]

## 2016-12-09 NOTE — ED Notes (Signed)
Pt states ulcerations on R cheek and L chin are not draining at this time. Pt states she cleaned them earlier today and since haven't been draining. Per provider do not do wound culture unless draining.

## 2016-12-09 NOTE — ED Notes (Signed)
X-ray at bedside

## 2016-12-09 NOTE — ED Notes (Signed)
Pt ambulatory to toilet to collect urine sample. Steady gait noted.

## 2016-12-09 NOTE — ED Notes (Signed)
Pt ambulatory to toilet

## 2016-12-09 NOTE — ED Triage Notes (Signed)
Patient to ER for rash with scabs to multiple areas of body. Two lesions seen on face with scabs present. Patient states "I think I have a staph infection". Patient in no acute distress.

## 2016-12-09 NOTE — ED Notes (Signed)
Called for triage with no answer from waiting room. Patient not visualized anywhere in waiting room.

## 2016-12-09 NOTE — ED Triage Notes (Signed)
Pt states "pimples" to face that have developed into wounds to face. States R side of face popped up about a month ago. States L side popped up about a week ago, states 2 on L chin have "more or less gone away" but there is one present on L chin. Also states present to L shoulder. Also c/o bilat hand swelling, nausea, and R knee pain that all began today. Alert, oriented, ambulatory.

## 2016-12-09 NOTE — ED Notes (Signed)
Pt returned from CT via wheelchair.

## 2016-12-14 LAB — CULTURE, BLOOD (ROUTINE X 2)
CULTURE: NO GROWTH
CULTURE: NO GROWTH

## 2017-04-05 DIAGNOSIS — F17210 Nicotine dependence, cigarettes, uncomplicated: Secondary | ICD-10-CM | POA: Diagnosis present

## 2017-04-05 DIAGNOSIS — L02413 Cutaneous abscess of right upper limb: Principal | ICD-10-CM | POA: Diagnosis present

## 2017-04-05 DIAGNOSIS — B9562 Methicillin resistant Staphylococcus aureus infection as the cause of diseases classified elsewhere: Secondary | ICD-10-CM | POA: Diagnosis present

## 2017-04-05 DIAGNOSIS — L03113 Cellulitis of right upper limb: Secondary | ICD-10-CM | POA: Diagnosis present

## 2017-04-05 LAB — COMPREHENSIVE METABOLIC PANEL
ALBUMIN: 3.4 g/dL — AB (ref 3.5–5.0)
ALT: 16 U/L (ref 14–54)
AST: 22 U/L (ref 15–41)
Alkaline Phosphatase: 70 U/L (ref 38–126)
Anion gap: 6 (ref 5–15)
BUN: 19 mg/dL (ref 6–20)
CHLORIDE: 104 mmol/L (ref 101–111)
CO2: 27 mmol/L (ref 22–32)
Calcium: 8.9 mg/dL (ref 8.9–10.3)
Creatinine, Ser: 0.68 mg/dL (ref 0.44–1.00)
GFR calc Af Amer: >60 mL/min (ref >60)
Glucose, Bld: 122 mg/dL — ABNORMAL HIGH (ref 65–99)
POTASSIUM: 3.7 mmol/L (ref 3.5–5.1)
Sodium: 137 mmol/L (ref 135–145)
Total Bilirubin: 0.7 mg/dL (ref 0.3–1.2)
Total Protein: 7.5 g/dL (ref 6.5–8.1)

## 2017-04-05 LAB — CBC WITH DIFFERENTIAL/PLATELET
Basophils Absolute: 0 10*3/uL (ref 0–0.1)
Basophils Relative: 1 %
Eosinophils Absolute: 0.2 10*3/uL (ref 0–0.7)
Eosinophils Relative: 3 %
HCT: 38.7 % (ref 35.0–47.0)
Hemoglobin: 12.7 g/dL (ref 12.0–16.0)
LYMPHS ABS: 2.4 10*3/uL (ref 1.0–3.6)
LYMPHS PCT: 30 %
MCH: 27.8 pg (ref 26.0–34.0)
MCHC: 33 g/dL (ref 32.0–36.0)
MCV: 84.4 fL (ref 80.0–100.0)
MONOS PCT: 7 %
Monocytes Absolute: 0.6 10*3/uL (ref 0.2–0.9)
NEUTROS PCT: 59 %
Neutro Abs: 4.9 10*3/uL (ref 1.4–6.5)
PLATELETS: 187 10*3/uL (ref 150–440)
RBC: 4.58 MIL/uL (ref 3.80–5.20)
RDW: 14 % (ref 11.5–14.5)
WBC: 8.2 10*3/uL (ref 3.6–11.0)

## 2017-04-05 NOTE — ED Triage Notes (Signed)
Patient c/o abscess on right forearm, with surrounding area of redness extending towards hand and up forearm. Patient has hx of recurrent staff infections.

## 2017-04-05 NOTE — ED Notes (Signed)
Patient's right hand obviously swollen and red, circular scabbed area noted. States this is a recurring problem for her.

## 2017-04-06 ENCOUNTER — Inpatient Hospital Stay
Admission: EM | Admit: 2017-04-06 | Discharge: 2017-04-08 | DRG: 581 | Disposition: A | Discharge status: Home / Self Care | Payer: Medicaid - Out of State | Attending: Internal Medicine | Admitting: Internal Medicine

## 2017-04-06 ENCOUNTER — Other Ambulatory Visit: Payer: Self-pay

## 2017-04-06 DIAGNOSIS — B9562 Methicillin resistant Staphylococcus aureus infection as the cause of diseases classified elsewhere: Secondary | ICD-10-CM | POA: Diagnosis present

## 2017-04-06 DIAGNOSIS — L0390 Cellulitis, unspecified: Secondary | ICD-10-CM | POA: Diagnosis present

## 2017-04-06 DIAGNOSIS — L02413 Cutaneous abscess of right upper limb: Secondary | ICD-10-CM

## 2017-04-06 DIAGNOSIS — L03113 Cellulitis of right upper limb: Secondary | ICD-10-CM

## 2017-04-06 DIAGNOSIS — L0291 Cutaneous abscess, unspecified: Secondary | ICD-10-CM

## 2017-04-06 LAB — RAPID HIV SCREEN (HIV 1/2 AB+AG)
HIV 1/2 Antibodies: NONREACTIVE
HIV-1 P24 ANTIGEN - HIV24: NONREACTIVE

## 2017-04-06 LAB — HEMOGLOBIN A1C
Hgb A1c MFr Bld: 5.3 % (ref 4.8–5.6)
Mean Plasma Glucose: 105.41 mg/dL

## 2017-04-06 MED ORDER — MORPHINE SULFATE (PF) 2 MG/ML IV SOLN: 2.0000 mg | INTRAVENOUS | Status: DC | PRN

## 2017-04-06 MED ORDER — LIDOCAINE-PRILOCAINE 2.5-2.5 % EX CREA
TOPICAL_CREAM | Freq: Once | CUTANEOUS | Status: AC
  Administered 2017-04-06: 01:00:00 via TOPICAL
  Filled 2017-04-06: qty 5

## 2017-04-06 MED ORDER — PIPERACILLIN-TAZOBACTAM 3.375 G IVPB
3.3750 g | Freq: Three times a day (TID) | INTRAVENOUS | Status: DC
  Administered 2017-04-06 – 2017-04-08 (×6): 3.375 g via INTRAVENOUS
  Filled 2017-04-06 (×6): qty 50

## 2017-04-06 MED ORDER — MORPHINE SULFATE (PF) 4 MG/ML IV SOLN
4.0000 mg | Freq: Once | INTRAVENOUS | Status: AC
  Administered 2017-04-06: 4 mg via INTRAVENOUS
  Filled 2017-04-06: qty 1

## 2017-04-06 MED ORDER — VANCOMYCIN HCL IN DEXTROSE 1-5 GM/200ML-% IV SOLN
1000.0000 mg | Freq: Once | INTRAVENOUS | Status: AC
  Administered 2017-04-06: 1000 mg via INTRAVENOUS
  Filled 2017-04-06: qty 200

## 2017-04-06 MED ORDER — ACETAMINOPHEN 650 MG RE SUPP: 650.0000 mg | Freq: Four times a day (QID) | RECTAL | Status: DC | PRN

## 2017-04-06 MED ORDER — ONDANSETRON HCL 4 MG/2ML IJ SOLN
4.0000 mg | Freq: Once | INTRAMUSCULAR | Status: AC
  Administered 2017-04-06: 4 mg via INTRAVENOUS
  Filled 2017-04-06: qty 2

## 2017-04-06 MED ORDER — ONDANSETRON HCL 4 MG/2ML IJ SOLN: 4.0000 mg | Freq: Four times a day (QID) | INTRAMUSCULAR | Status: DC | PRN

## 2017-04-06 MED ORDER — LIDOCAINE HCL (PF) 1 % IJ SOLN
INTRAMUSCULAR | Status: AC
  Administered 2017-04-06: 02:00:00
  Filled 2017-04-06: qty 5

## 2017-04-06 MED ORDER — VANCOMYCIN HCL IN DEXTROSE 1-5 GM/200ML-% IV SOLN
1000.0000 mg | Freq: Two times a day (BID) | INTRAVENOUS | Status: DC
  Administered 2017-04-06 – 2017-04-08 (×5): 1000 mg via INTRAVENOUS
  Filled 2017-04-06 (×7): qty 200

## 2017-04-06 MED ORDER — ACETAMINOPHEN 325 MG PO TABS: 650.0000 mg | ORAL_TABLET | Freq: Four times a day (QID) | ORAL | Status: DC | PRN

## 2017-04-06 MED ORDER — HYDROCODONE-ACETAMINOPHEN 5-325 MG PO TABS
1.0000 | ORAL_TABLET | ORAL | Status: DC | PRN
  Administered 2017-04-06 – 2017-04-08 (×8): 1 via ORAL
  Filled 2017-04-06 (×8): qty 1

## 2017-04-06 MED ORDER — ONDANSETRON HCL 4 MG PO TABS: 4.0000 mg | ORAL_TABLET | Freq: Four times a day (QID) | ORAL | Status: DC | PRN

## 2017-04-06 NOTE — Progress Notes (Signed)
Pharmacy Antibiotic Note  Susan Terry is a 42 y.o. female admitted on 04/06/2017 with cellulitis/abscess.  Pharmacy has been consulted for vancomycin dosing. Zosyn added.   Plan: DW 58kg  Vd 41L kei 0.077 hr-1  T1/2 9 hours Vancomycin 1 gram q 12 hours ordered with stacked dosing. Level before 5th dose. Goal trough 15-20  Zosyn 3.375 g IV q8h EI.  Height: 5\' 7"  (170.2 cm) Weight: 128 lb (58.1 kg) IBW/kg (Calculated) : 61.6  Temp (24hrs), Avg:98.5 F (36.9 C), Min:98.4 F (36.9 C), Max:98.6 F (37 C)  Recent Labs  Lab 04/05/17 2040  WBC 8.2  CREATININE 0.68    Estimated Creatinine Clearance: 86.6 mL/min (by C-G formula based on SCr of 0.68 mg/dL).    No Known Allergies  Antimicrobials this admission:  vancomycin 3/11 >>   Zosyn 3/11 >>   Dose adjustments this admission:   Microbiology results: No micro  Thank you for allowing pharmacy to be a part of this patient's care.  Marty HeckWang, Jamara Vary L 04/06/2017 10:38 AM

## 2017-04-06 NOTE — Progress Notes (Signed)
Pharmacy Antibiotic Note  Susan Terry is a 42 y.o. female admitted on 04/06/2017 with cellulitis/abscess.  Pharmacy has been consulted for vancomycin dosing.  Plan: DW 58kg  Vd 41L kei 0.077 hr-1  T1/2 9 hours Vancomycin 1 gram q 12 hours ordered with stacked dosing. Level before 5th dose. Goal trough 15-20  Height: 5\' 7"  (170.2 cm) Weight: 128 lb (58.1 kg) IBW/kg (Calculated) : 61.6  Temp (24hrs), Avg:98.5 F (36.9 C), Min:98.4 F (36.9 C), Max:98.6 F (37 C)  Recent Labs  Lab 04/05/17 2040  WBC 8.2  CREATININE 0.68    Estimated Creatinine Clearance: 86.6 mL/min (by C-G formula based on SCr of 0.68 mg/dL).    No Known Allergies  Antimicrobials this admission:  vancomycin 3/11 >>    >>   Dose adjustments this admission:   Microbiology results: No micro  Thank you for allowing pharmacy to be a part of this patient's care.  Temitope Flammer S 04/06/2017 3:39 AM

## 2017-04-06 NOTE — Progress Notes (Signed)
MD on call notified of pt concern that no cultures of blood or wound were done. No new orders received. Will cont to monitor

## 2017-04-06 NOTE — Consult Note (Signed)
SURGICAL CONSULTATION NOTE (initial) - cpt: [id number]: [id number]  HISTORY OF PRESENT ILLNESS (HPI):  42 y.o. pleasant female presented to [facility] ED for evaluation of Right arm pain and swelling. Patient reports she's previously developed similar infections of her hand and face, seen prior abscesses drained, and attempted to drain this one herself, but she says she was not able cut herself. Though patient's admission H&P states no drug abuse, patient's drug screen was + for cocaine in November, described by patient as the time she was experimenting, but she denies any IV drug abuse. She otherwise denies fever/chills, N/V, CP, or SOB and says the Right arm abscess was drained by ED physician and again by infectious disease physician.  Surgery is consulted by medical physician Dr. Nemiah CommanderKalisetti in this context for evaluation and management of Right forearm abscess.  PAST MEDICAL HISTORY (PMH):  History reviewed. No pertinent past medical history.   PAST SURGICAL HISTORY (PSH):  Past Surgical History:  Procedure Laterality Date  . INCISION AND DRAINAGE FOREARM / WRIST DEEP Right      MEDICATIONS:  Prior to Admission medications   Not on File     ALLERGIES:  No Known Allergies   SOCIAL HISTORY:  Social History   Socioeconomic History  . Marital status: Single    Spouse name: Not on file  . Number of children: Not on file  . Years of education: Not on file  . Highest education level: Not on file  Social Needs  . Financial resource strain: Not on file  . Food insecurity - worry: Not on file  . Food insecurity - inability: Not on file  . Transportation needs - medical: Not on file  . Transportation needs - non-medical: Not on file  Occupational History  . Not on file  Tobacco Use  . Smoking status: Current Every Day Smoker    Packs/day: 0.50    Types: Cigarettes  . Smokeless tobacco: Never Used  Substance and Sexual Activity  . Alcohol use: No  . Drug use: No  . Sexual activity: No  Other  Topics Concern  . Not on file  Social History Narrative  . Not on file    The patient currently resides (home / rehab facility / nursing home): Home (described as extended visit with her mom in [place] between traveling) The patient normally is (ambulatory / bedbound): Ambulatory   FAMILY HISTORY:  Family History  Problem Relation Age of Onset  . Cancer Mother        cancer throughout family     REVIEW OF SYSTEMS:  Constitutional: denies weight loss, fever, chills, or sweats  Eyes: denies any other vision changes, history of eye injury  ENT: denies sore throat, hearing problems  Respiratory: denies shortness of breath, wheezing  Cardiovascular: denies chest pain, palpitations  Gastrointestinal: denies abdominal pain, N/V, or diarrhea Genitourinary: denies burning with urination or urinary frequency Musculoskeletal: denies any other joint pains or cramps  Skin: denies any other rashes or skin discolorations except as per HPI Neurological: denies any other headache, dizziness, weakness  Psychiatric: denies any other depression, anxiety   All other review of systems were negative   VITAL SIGNS:  Temp:  [98.4 F (36.9 C)-98.6 F (37 C)] 98.6 F (37 C) (03/11 0737) Pulse Rate:  [69-83] 69 (03/11 0737) Resp:  [16-18] 16 (03/11 0737) BP: (110-117)/(60-83) 112/64 (03/11 0737) SpO2:  [98 %-99 %] 99 % (03/11 0737) Weight:  [128 lb (58.1 kg)] 128 lb (58.1 kg) (03/11 0319)  Height: 5\' 7"  (170.2 cm) Weight: 128 lb (58.1 kg) BMI (Calculated): 20.04   INTAKE/OUTPUT:  This shift: Total I/O In: 420 [P.O.:420] Out: -   Last 2 shifts: @IOLAST2SHIFTS @   PHYSICAL EXAM:  Constitutional:  -- Normal body habitus  -- Awake, alert, and oriented x3, no apparent distress Eyes:  -- Pupils equally round and reactive to light  -- No scleral icterus, B/L no occular discharge Ear, nose, throat: -- Neck is FROM WNL -- No jugular venous distension  Pulmonary:  -- No wheezes or rhales --  Equal breath sounds bilaterally -- Breathing non-labored at rest Cardiovascular:  -- S1, S2 present  -- No pericardial rubs  Gastrointestinal:  -- Abdomen soft, nontender, non-distended, no guarding or rebound tenderness -- No abdominal masses appreciated, pulsatile or otherwise  Musculoskeletal and Integumentary:  -- Wounds or skin discoloration: Right distal dorsal forearm 1 cm wound packed with gauze ribbon, minimal surrounding erythema without significant residual fluctuance, no visualized purulent drainage (though patient refused removal of gauze) -- Extremities: B/L UE and LE FROM, hands and feet warm, mild Right hand > arm edema with kerlix gauze wrap starting at wrist level (proximal to Right hand edema) Neurologic:  -- Motor function: Intact and symmetric -- Sensation: Intact and symmetric Psychiatric:  -- Mood and affect WNL  Pulse/Doppler Exam: (p=palpable; d=doppler signals; 0=none)     Right   Left   Brach  p   p   Rad  p   p   Labs:  CBC Latest Ref Rng & Units 04/05/2017 12/09/2016 08/10/2016  WBC 3.6 - 11.0 K/uL 8.2 6.2 7.9  Hemoglobin 12.0 - 16.0 g/dL 16.1 09.6 04.5  Hematocrit 35.0 - 47.0 % 38.7 36.4 40.6  Platelets 150 - 440 K/uL 187 154 163   CMP Latest Ref Rng & Units 04/05/2017 12/09/2016 08/10/2016  Glucose 65 - 99 mg/dL 409(W) 119(J) 478(G)  BUN 6 - 20 mg/dL 19 12 7   Creatinine 0.44 - 1.00 mg/dL 9.56 2.13 0.86  Sodium 135 - 145 mmol/L 137 134(L) 138  Potassium 3.5 - 5.1 mmol/L 3.7 3.7 4.2  Chloride 101 - 111 mmol/L 104 99(L) 102  CO2 22 - 32 mmol/L 27 26 27   Calcium 8.9 - 10.3 mg/dL 8.9 9.0 9.5  Total Protein 6.5 - 8.1 g/dL 7.5 7.1 7.7  Total Bilirubin 0.3 - 1.2 mg/dL 0.7 0.4 0.5  Alkaline Phos 38 - 126 U/L 70 59 70  AST 15 - 41 U/L 22 24 22   ALT 14 - 54 U/L 16 15 13(L)   Imaging studies: No new pertinent imaging studies  Assessment/Plan: (ICD-10's: L02.413) 42 y.o. female doing well s/p incision and drainage of Right distal dorsal forearm cutaneous  abscess, complicated by pertinent comorbidities including ongoing tobacco smoking and at least a history of cocaine abuse.   - pain control prn   - antibiotics as per ID   - abscess drainage appears adequate, though patient refused removal of packing  - change gauze packing and dressing at least once daily + as needed  - DVT prophylaxis  All of the above findings and recommendations were discussed with the patient and her RN, and all of patient's questions were answered to her expressed satisfaction.  Thank you for the opportunity to participate in this patient's care.   -- Scherrie Gerlach Earlene Plater, MD, RPVI [place]: [facility] General Surgery - Partnering for exceptional care. Office: [phone]

## 2017-04-06 NOTE — Consult Note (Signed)
[facility] Infectious Disease     Reason for Consult: Cellulitis   Referring Physician: Claria Dice Date of Admission:  04/06/2017   Active Problems:   Cellulitis   HPI: Susan Terry is a 42 y.o. female admitted with pain and swelling in her right arm.  This has occurred in the past but she has had infections to both on her hand and on her face.  She tried to drain it herself but was unsuccessful.  On admission her white blood count was 8.2 and she had no fevers.  She did have ED visits in November and July 2018.  In November she did have a CT showing some inflammation over the zygomatic arch and left mandibular angle on her face but no associated abscess.  In July she had a lesion on the dorsum of the right hand that could be an abscess or hematoma.She does have a history of positive drug screen in November with cocaine. In ED had I and D done and packed.  History reviewed. No pertinent past medical history. Past Surgical History:  Procedure Laterality Date  . INCISION AND DRAINAGE FOREARM / WRIST DEEP Right    Social History   Tobacco Use  . Smoking status: Current Every Day Smoker    Packs/day: 0.50    Types: Cigarettes  . Smokeless tobacco: Never Used  Substance Use Topics  . Alcohol use: No  . Drug use: No   Family History  Problem Relation Age of Onset  . Cancer Mother        cancer throughout family    Allergies: No Known Allergies  Current antibiotics: Antibiotics Given (last 72 hours)    Date/Time Action Medication Dose Rate   04/06/17 0107 New Bag/Given   vancomycin (VANCOCIN) IVPB 1000 mg/200 mL premix 1,000 mg 200 mL/hr   04/06/17 0740 New Bag/Given   vancomycin (VANCOCIN) IVPB 1000 mg/200 mL premix 1,000 mg 200 mL/hr   04/06/17 1200 New Bag/Given   piperacillin-tazobactam (ZOSYN) IVPB 3.375 g 3.375 g 12.5 mL/hr      MEDICATIONS:   Review of Systems - 11 systems reviewed and negative per HPI   OBJECTIVE: Temp:  [98.4 F (36.9 C)-98.6 F (37  C)] 98.6 F (37 C) (03/11 0737) Pulse Rate:  [69-83] 69 (03/11 0737) Resp:  [16-18] 16 (03/11 0737) BP: (110-117)/(60-83) 112/64 (03/11 0737) SpO2:  [98 %-99 %] 99 % (03/11 0737) Weight:  [58.1 kg (128 lb)] 58.1 kg (128 lb) (03/11 0319) Physical Exam  Constitutional:  oriented to person, place, and time. appears well-developed and well-nourished. No distress.  HENT: [place]/AT, PERRLA, no scleral icterus Mouth/Throat: Oropharynx is clear and moist. No oropharyngeal exudate.  Cardiovascular: Normal rate, regular rhythm and normal heart sounds. Exam reveals no gallop and no friction rub.  No murmur heard.  Pulmonary/Chest: Effort normal and breath sounds normal. No respiratory distress.  has no wheezes.  Neck = supple, no nuchal rigidity Abdominal: Soft. Bowel sounds are normal.  exhibits no distension. There is no tenderness.  Lymphadenopathy: no cervical adenopathy. No axillary adenopathy Neurological: alert and oriented to person, place, and time.  Skin: R wrist with swelling over dorsum of hand and wrist. Removed gauze and packing and able to express mod amt pus.  LABS: Results for orders placed or performed during the hospital encounter of 04/06/17 (from the past 48 hour(s))  CBC with Differential     Status: None   Collection Time: 04/05/17  8:40 PM  Result Value Ref Range   WBC  8.2 3.6 - 11.0 K/uL   RBC 4.58 3.80 - 5.20 MIL/uL   Hemoglobin 12.7 12.0 - 16.0 g/dL   HCT 38.7 35.0 - 47.0 %   MCV 84.4 80.0 - 100.0 fL   MCH 27.8 26.0 - 34.0 pg   MCHC 33.0 32.0 - 36.0 g/dL   RDW 14.0 11.5 - 14.5 %   Platelets 187 150 - 440 K/uL   Neutrophils Relative % 59 %   Neutro Abs 4.9 1.4 - 6.5 K/uL   Lymphocytes Relative 30 %   Lymphs Abs 2.4 1.0 - 3.6 K/uL   Monocytes Relative 7 %   Monocytes Absolute 0.6 0.2 - 0.9 K/uL   Eosinophils Relative 3 %   Eosinophils Absolute 0.2 0 - 0.7 K/uL   Basophils Relative 1 %   Basophils Absolute 0.0 0 - 0.1 K/uL    Comment: Performed at [facility], [address] [place], [place] [address]  Comprehensive metabolic panel     Status: Abnormal   Collection Time: 04/05/17  8:40 PM  Result Value Ref Range   Sodium 137 135 - 145 mmol/L   Potassium 3.7 3.5 - 5.1 mmol/L   Chloride 104 101 - 111 mmol/L   CO2 27 22 - 32 mmol/L   Glucose, Bld 122 (H) 65 - 99 mg/dL   BUN 19 6 - 20 mg/dL   Creatinine, Ser 0.68 0.44 - 1.00 mg/dL   Calcium 8.9 8.9 - 10.3 mg/dL   Total Protein 7.5 6.5 - 8.1 g/dL   Albumin 3.4 (L) 3.5 - 5.0 g/dL   AST 22 15 - 41 U/L   ALT 16 14 - 54 U/L   Alkaline Phosphatase 70 38 - 126 U/L   Total Bilirubin 0.7 0.3 - 1.2 mg/dL   GFR calc non Af Amer >60 >60 mL/min   GFR calc Af Amer >60 >60 mL/min    Comment: (NOTE) The eGFR has been calculated using the CKD EPI equation. This calculation has not been validated in all clinical situations. eGFR's persistently <60 mL/min signify possible Chronic Kidney Disease.    Anion gap 6 5 - 15    Comment: Performed at [facility], [address] [place], [place] [address]  Hemoglobin A1c     Status: None   Collection Time: 04/05/17  8:40 PM  Result Value Ref Range   Hgb A1c MFr Bld 5.3 4.8 - 5.6 %    Comment: (NOTE) Pre diabetes:          5.7%-6.4% Diabetes:              >6.4% Glycemic control for   <7.0% adults with diabetes    Mean Plasma Glucose 105.41 mg/dL    Comment: Performed at [facility] [address], [place], [place] [address]   No components found for: ESR, C REACTIVE PROTEIN MICRO: No results found for this or any previous visit (from the past 720 hour(s)).  IMAGING: No results found.  Assessment:   Susan Terry is a 42 y.o. female with R wrist abscess now s/p I and D. Culture sent today. Repacked.   Discussed will need to elevate arm, likely need 1-2 more days IV abx. Change packing daily.  Recommendations  Cont vanco and zosyn Elevate arm Can likley dc in 1-2 days on orals Discussed likley recurrent MRSA  infections and discussed hibiclens and bactroban to prevent recurrences. Thank you very much for allowing me to participate in the care of this patient. Please  call with questions.   Cheral Marker. Ola Spurr, MD

## 2017-04-06 NOTE — ED Notes (Signed)
ED Provider at bedside. 

## 2017-04-06 NOTE — Progress Notes (Signed)
[facility] [place] at [facility]   PATIENT NAME: Susan Terry    MR#:  [id number]  DATE OF BIRTH:  Jun 12, 1975  SUBJECTIVE:  CHIEF COMPLAINT:   Chief Complaint  Patient presents with  . Recurrent Skin Infections   -  Recurrent right hand infections - works in [employer]. S/p I&D in ED- unfortunately cultures not sent for - waiting for surgical consult  REVIEW OF SYSTEMS:  Review of Systems  Constitutional: Negative for chills, fever and malaise/fatigue.  HENT: Negative for congestion, ear discharge, hearing loss and nosebleeds.   Eyes: Negative for blurred vision and double vision.  Respiratory: Negative for cough, shortness of breath and wheezing.   Cardiovascular: Negative for chest pain, palpitations and leg swelling.  Gastrointestinal: Negative for abdominal pain, constipation, diarrhea, nausea and vomiting.  Genitourinary: Negative for dysuria.  Musculoskeletal: Positive for myalgias.  Neurological: Negative for dizziness, speech change, focal weakness, seizures and headaches.  Psychiatric/Behavioral: Negative for depression.    DRUG ALLERGIES:  No Known Allergies  VITALS:  Blood pressure 112/64, pulse 69, temperature 98.6 F (37 C), temperature source Oral, resp. rate 16, height 5\' 7"  (1.702 m), weight 58.1 kg (128 lb), last menstrual period 04/06/2017, SpO2 99 %.  PHYSICAL EXAMINATION:  Physical Exam  GENERAL:  42 y.o.-year-old patient lying in the bed with no acute distress.  EYES: Pupils equal, round, reactive to light and accommodation. No scleral icterus. Extraocular muscles intact.  HEENT: Head atraumatic, normocephalic. Oropharynx and nasopharynx clear.  NECK:  Supple, no jugular venous distention. No thyroid enlargement, no tenderness.  LUNGS: Normal breath sounds bilaterally, no wheezing, rales,rhonchi or crepitation. No use of accessory muscles of respiration.  CARDIOVASCULAR: S1, S2 normal. No murmurs, rubs, or gallops.    ABDOMEN: Soft, nontender, nondistended. Bowel sounds present. No organomegaly or mass.  EXTREMITIES: right dorsum of hand is very swollen, distal fore arm with a open wound with swelling, redness and tenderness around it. No pedal edema, cyanosis, or clubbing.  NEUROLOGIC: Cranial nerves II through XII are intact. Muscle strength 5/5 in all extremities. Sensation intact. Gait not checked.  PSYCHIATRIC: The patient is alert and oriented x 3.  SKIN: No obvious rash, lesion, or ulcer.    LABORATORY PANEL:   CBC Recent Labs  Lab 04/05/17 2040  WBC 8.2  HGB 12.7  HCT 38.7  PLT 187   ------------------------------------------------------------------------------------------------------------------  Chemistries  Recent Labs  Lab 04/05/17 2040  NA 137  K 3.7  CL 104  CO2 27  GLUCOSE 122*  BUN 19  CREATININE 0.68  CALCIUM 8.9  AST 22  ALT 16  ALKPHOS 70  BILITOT 0.7   ------------------------------------------------------------------------------------------------------------------  Cardiac Enzymes No results for input(s): TROPONINI in the last 168 hours. ------------------------------------------------------------------------------------------------------------------  RADIOLOGY:  No results found.  EKG:  No orders found for this or any previous visit.  ASSESSMENT AND PLAN:   42 year old female with no past medical history presents to hospital secondary to recurrent right hand abscess.  1. Recurrent right forearm abscess with hand cellulitis-not in the exact same location, she had her prior infection on the dorsum of the hand. -Still has significant swelling of the dorsum of the hand, also has a open wound on the distal forearm - incision and drainage done in the ED, unfortunately wound cultures were not sent for -Agree with vancomycin and Zosyn. -ID consult pending -Surgical consult for further incision and drainage -pain control, keep the hand elevated  2. DVT  prophylaxis-encourage ambulation   All  the records are reviewed and case discussed with Care Management/Social Workerr. Management plans discussed with the patient, family and they are in agreement.  CODE STATUS: Full Code  TOTAL TIME TAKING CARE OF THIS PATIENT: 38 minutes.   POSSIBLE D/C IN 1-2 DAYS, DEPENDING ON CLINICAL CONDITION.   Enid BaasKALISETTI,Marissa Lowrey M.D on 04/06/2017 at 10:25 AM  Between 7am to 6pm - Pager - [phone]  After 6pm go to [email or web address] - password [email or web address]  [facility]  Office  [phone]  CC: Primary care physician; Patient, No Pcp Per

## 2017-04-06 NOTE — H&P (Signed)
Susan Terry is an 42 y.o. female.   Chief Complaint: Skin infection HPI: The patient with no chronic medical problems presents to the emergency department complaining of a recurrent abscess on her right wrist.  The patient completed a course of antibiotics approximately 2 months ago for lesion in the same area.  The current abscess developed yesterday and has grown in size tremendously.  She denies fever, nausea or vomiting.  The abscess was drained at bedside in the emergency department.  The patient received a dose of vancomycin but due to its rapid development and recurrent nature of the emergency department staff asked the hospitalist service for further evaluation.  History reviewed. No pertinent past medical history. None  Past Surgical History:  Procedure Laterality Date  . INCISION AND DRAINAGE FOREARM / WRIST DEEP Right     Family History  Problem Relation Age of Onset  . Cancer Mother        cancer throughout family   Social History:  reports that she has been smoking cigarettes.  She has been smoking about 0.50 packs per day. she has never used smokeless tobacco. She reports that she does not drink alcohol or use drugs.  Allergies: No Known Allergies  No medications prior to admission.    Results for orders placed or performed during the hospital encounter of 04/06/17 (from the past 48 hour(s))  CBC with Differential     Status: None   Collection Time: 04/05/17  8:40 PM  Result Value Ref Range   WBC 8.2 3.6 - 11.0 K/uL   RBC 4.58 3.80 - 5.20 MIL/uL   Hemoglobin 12.7 12.0 - 16.0 g/dL   HCT 38.7 35.0 - 47.0 %   MCV 84.4 80.0 - 100.0 fL   MCH 27.8 26.0 - 34.0 pg   MCHC 33.0 32.0 - 36.0 g/dL   RDW 14.0 11.5 - 14.5 %   Platelets 187 150 - 440 K/uL   Neutrophils Relative % 59 %   Neutro Abs 4.9 1.4 - 6.5 K/uL   Lymphocytes Relative 30 %   Lymphs Abs 2.4 1.0 - 3.6 K/uL   Monocytes Relative 7 %   Monocytes Absolute 0.6 0.2 - 0.9 K/uL   Eosinophils Relative 3 %   Eosinophils Absolute 0.2 0 - 0.7 K/uL   Basophils Relative 1 %   Basophils Absolute 0.0 0 - 0.1 K/uL    Comment: Performed at [facility], [address] [place], [place] [address]  Comprehensive metabolic panel     Status: Abnormal   Collection Time: 04/05/17  8:40 PM  Result Value Ref Range   Sodium 137 135 - 145 mmol/L   Potassium 3.7 3.5 - 5.1 mmol/L   Chloride 104 101 - 111 mmol/L   CO2 27 22 - 32 mmol/L   Glucose, Bld 122 (H) 65 - 99 mg/dL   BUN 19 6 - 20 mg/dL   Creatinine, Ser 0.68 0.44 - 1.00 mg/dL   Calcium 8.9 8.9 - 10.3 mg/dL   Total Protein 7.5 6.5 - 8.1 g/dL   Albumin 3.4 (L) 3.5 - 5.0 g/dL   AST 22 15 - 41 U/L   ALT 16 14 - 54 U/L   Alkaline Phosphatase 70 38 - 126 U/L   Total Bilirubin 0.7 0.3 - 1.2 mg/dL   GFR calc non Af Amer >60 >60 mL/min   GFR calc Af Amer >60 >60 mL/min    Comment: (NOTE) The eGFR has been calculated using the CKD EPI equation. This calculation  has not been validated in all clinical situations. eGFR's persistently <60 mL/min signify possible Chronic Kidney Disease.    Anion gap 6 5 - 15    Comment: Performed at [facility], [address] [place], [place] [address]   No results found.  Review of Systems  Constitutional: Negative for chills and fever.  HENT: Negative for sore throat and tinnitus.   Eyes: Negative for blurred vision and redness.  Respiratory: Negative for cough and shortness of breath.   Cardiovascular: Negative for chest pain, palpitations, orthopnea and PND.  Gastrointestinal: Negative for abdominal pain, diarrhea, nausea and vomiting.  Genitourinary: Negative for dysuria, frequency and urgency.  Musculoskeletal: Positive for joint pain. Negative for myalgias.  Skin: Negative for rash.       No lesions  Neurological: Negative for speech change, focal weakness and weakness.  Endo/Heme/Allergies: Does not bruise/bleed easily.       No temperature intolerance  Psychiatric/Behavioral: Negative  for depression and suicidal ideas.    Blood pressure 110/60, pulse 79, temperature 98.4 F (36.9 C), temperature source Oral, resp. rate 18, height _0  (1.702 m), weight 58.1 kg (128 lb), last menstrual period 04/06/2017, SpO2 98 %. Physical Exam  Vitals reviewed. Constitutional: She is oriented to person, place, and time. She appears well-developed and well-nourished. No distress.  HENT:  Head: Normocephalic and atraumatic.  Mouth/Throat: Oropharynx is clear and moist.  Eyes: Conjunctivae and EOM are normal. Pupils are equal, round, and reactive to light. No scleral icterus.  Neck: Normal range of motion. Neck supple. No JVD present. No tracheal deviation present. No thyromegaly present.  Cardiovascular: Normal rate, regular rhythm and normal heart sounds. Exam reveals no gallop and no friction rub.  No murmur heard. Respiratory: Effort normal and breath sounds normal.  GI: Soft. Bowel sounds are normal. She exhibits no distension. There is no tenderness.  Genitourinary:  Genitourinary Comments: Deferred  Musculoskeletal: Normal range of motion. She exhibits no edema.  Lymphadenopathy:    She has no cervical adenopathy.  Neurological: She is alert and oriented to person, place, and time. No cranial nerve deficit. She exhibits normal muscle tone.  Skin: Skin is warm and dry. No rash noted. There is erythema.  Psychiatric: She has a normal mood and affect. Her behavior is normal. Judgment and thought content normal.     Assessment/Plan This is a 42 year old female admitted for abscess of her right wrist. 1.  Cellulitis/abscess: Large amount of purulent drainage removed.  The patient has swelling in her right hand that slightly limits the range of motion of her digits but does not cause pain.  Unlikely tenosynovitis.  However, the area of erythema and swelling and tenderness encompasses at least one third of her right arm.  Will have surgery to evaluate.  Manage pain. 2.  DVT  prophylaxis: Patient is ambulatory 3.  GI prophylaxis: None The patient is a full code.  Time spent on admission orders and patient care approximately 45 minutes  Harrie Foreman, MD 04/06/2017, 3:47 AM

## 2017-04-06 NOTE — ED Provider Notes (Signed)
[facility] Emergency Department Provider Note   ____________________________________________   First MD Initiated Contact with Patient 04/06/17 0026     (approximate)  I have reviewed the triage vital signs and the nursing notes.   HISTORY  Chief Complaint Recurrent Skin Infections    HPI Susan Terry is a 42 y.o. female who comes into the hospital today with an infection to her right arm.  The patient states that this is a third time she has had this.  The patient reports though that she keeps getting these infections.  She gets antibiotics and it goes away but then it comes back.  The patient states that this time is the worst.  The patient noticed the swelling and redness to her right arm Saturday morning.  She reports that she has had severe pain which she rates an 8 out of 10 in intensity currently.  She denies any fever but has felt very tired and nauseous.  The patient has had infections to her hand and her face before.  She tried to drain it herself but was unsuccessful.  The patient's right hand and forearm is swollen.  She is here today for evaluation.   History reviewed. No pertinent past medical history.  Patient Active Problem List   Diagnosis Date Noted  . Cellulitis 04/06/2017    History reviewed. No pertinent surgical history.  Prior to Admission medications   Medication Sig Start Date End Date Taking? Authorizing Provider  meloxicam (MOBIC) 15 MG tablet Take 1 tablet (15 mg total) daily by mouth. 12/09/16   Cuthriell, Delorise RoyalsJonathan D, PA-C    Allergies Patient has no known allergies.  No family history on file.  Social History Social History   Tobacco Use  . Smoking status: Current Every Day Smoker    Types: Cigarettes  . Smokeless tobacco: Never Used  Substance Use Topics  . Alcohol use: No  . Drug use: No    Review of Systems  Constitutional: No fever/chills Eyes: No visual changes. ENT: No sore  throat. Cardiovascular: Denies chest pain. Respiratory: Denies shortness of breath. Gastrointestinal: No abdominal pain.  No nausea, no vomiting.  No diarrhea.  No constipation. Genitourinary: Negative for dysuria. Musculoskeletal: Negative for back pain. Skin: Abscess to back of right wrist with swelling and erythema Neurological: Negative for headaches, focal weakness or numbness.   ____________________________________________   PHYSICAL EXAM:  VITAL SIGNS: ED Triage Vitals  Enc Vitals Group     BP 04/05/17 2037 117/83     Pulse Rate 04/05/17 2037 83     Resp 04/05/17 2037 17     Temp 04/05/17 2037 98.6 F (37 C)     Temp Source 04/05/17 2037 Oral     SpO2 04/05/17 2037 98 %     Weight 04/05/17 2036 128 lb (58.1 kg)     Height --      Head Circumference --      Peak Flow --      Pain Score 04/05/17 2036 7     Pain Loc --      Pain Edu? --      Excl. in GC? --     Constitutional: Alert and oriented. Well appearing and in moderate distress. Eyes: Conjunctivae are normal. PERRL. EOMI. Head: Atraumatic. Nose: No congestion/rhinnorhea. Mouth/Throat: Mucous membranes are moist.  Oropharynx non-erythematous. Cardiovascular: Normal rate, regular rhythm. Grossly normal heart sounds.  Good peripheral circulation. Respiratory: Normal respiratory effort.  No retractions. Lungs CTAB. Gastrointestinal: Soft and nontender.  No distention.  Positive bowel sounds Musculoskeletal: Erythema to the right hand extending to the forearm. Neurologic:  Normal speech and language. . Skin:  Skin is warm, dry and intact.  Large 4 x 2-1/2 cm abscess to the dorsum of the right wrist with some extensive swelling to the right hand and cellulitis to the hand as well as up into the forearm. Psychiatric: Mood and affect are normal.   ____________________________________________   LABS (all labs ordered are listed, but only abnormal results are displayed)  Labs Reviewed  COMPREHENSIVE METABOLIC  PANEL - Abnormal; Notable for the following components:      Result Value   Glucose, Bld 122 (*)    Albumin 3.4 (*)    All other components within normal limits  CBC WITH DIFFERENTIAL/PLATELET   ____________________________________________  EKG  none ____________________________________________  RADIOLOGY  ED MD interpretation:  none  Official radiology report(s): No results found.  ____________________________________________   PROCEDURES  Procedure(s) performed: please, see procedure note(s).  Marland Kitchen.Incision and Drainage Date/Time: 04/06/2017 1:15 AM Performed by: Rebecka Apley, MD Authorized by: Rebecka Apley, MD   Consent:    Consent obtained:  Verbal   Consent given by:  Patient   Risks discussed:  Bleeding, infection, incomplete drainage and pain   Alternatives discussed:  Alternative treatment, delayed treatment and observation Location:    Type:  Abscess   Size:  4   Location:  Upper extremity   Upper extremity location:  Wrist   Wrist location:  R wrist Pre-procedure details:    Skin preparation:  Betadine Anesthesia (see MAR for exact dosages):    Anesthesia method:  Local infiltration and topical application   Topical anesthetic:  EMLA cream   Local anesthetic:  Lidocaine 1% w/o epi Procedure type:    Complexity:  Complex Procedure details:    Incision types:  Stab incision   Incision depth:  Subcutaneous   Scalpel blade:  11   Wound management:  Probed and deloculated   Drainage:  Bloody and purulent   Drainage amount:  Moderate   Packing materials:  1/4 in iodoform gauze Post-procedure details:    Patient tolerance of procedure:  Tolerated well, no immediate complications    Critical Care performed: No  ____________________________________________   INITIAL IMPRESSION / ASSESSMENT AND PLAN / ED COURSE  As part of my medical decision making, I reviewed the following data within the electronic MEDICAL RECORD NUMBER Notes from prior ED  visits and [place] Controlled Substance Database   This is a 42 year old female who comes into the hospital today with an abscess and cellulitis to the back of her right wrist.  The patient has some significant swelling to her hand.  It appears that the patient has cellulitis as well as an abscess.  The patient received some blood work which was unremarkable but given the extent of her swelling I did give the patient a dose of vancomycin as well as morphine and Zofran.  I will incise and drain the abscess and I will admit the patient for some more IV antibiotics.     I contacted the admitting physician to admit the patient.  I will give her a third dose of morphine and she will be admitted for cellulitis. ____________________________________________   FINAL CLINICAL IMPRESSION(S) / ED DIAGNOSES  Final diagnoses:  Cellulitis of right upper extremity  Abscess     ED Discharge Orders    None       Note:  This document was prepared  using Conservation officer, historic buildings and may include unintentional dictation errors.    Rebecka Apley, MD 04/06/17 [phone]

## 2017-04-06 NOTE — ED Notes (Signed)
Patient reports having history of "infection" to right arm.  Reports area red, swollen and painful.

## 2017-04-07 DIAGNOSIS — B9562 Methicillin resistant Staphylococcus aureus infection as the cause of diseases classified elsewhere: Secondary | ICD-10-CM | POA: Diagnosis present

## 2017-04-07 DIAGNOSIS — L02413 Cutaneous abscess of right upper limb: Secondary | ICD-10-CM | POA: Diagnosis present

## 2017-04-07 DIAGNOSIS — L03113 Cellulitis of right upper limb: Secondary | ICD-10-CM | POA: Diagnosis present

## 2017-04-07 DIAGNOSIS — L0291 Cutaneous abscess, unspecified: Secondary | ICD-10-CM | POA: Diagnosis not present

## 2017-04-07 DIAGNOSIS — F17210 Nicotine dependence, cigarettes, uncomplicated: Secondary | ICD-10-CM | POA: Diagnosis present

## 2017-04-07 LAB — CREATININE, SERUM: CREATININE: 0.76 mg/dL (ref 0.44–1.00)

## 2017-04-07 LAB — VANCOMYCIN, TROUGH: Vancomycin Tr: 14 ug/mL — ABNORMAL LOW (ref 15–20)

## 2017-04-07 MED ORDER — LIDOCAINE-PRILOCAINE 2.5-2.5 % EX CREA
TOPICAL_CREAM | Freq: Every day | CUTANEOUS | Status: DC | PRN
  Filled 2017-04-07: qty 5

## 2017-04-07 NOTE — Progress Notes (Signed)
[facility] CLINIC INFECTIOUS DISEASE PROGRESS NOTE Date of Admission:  04/06/2017     ID: Shalon Salado is a 42 y.o. female with  Hand abscess Active Problems:   Cellulitis   Subjective: No fevers, hand still painful and swollen. Seen by surgery.  ROS  Eleven systems are reviewed and negative except per hpi  Medications:  Antibiotics Given (last 72 hours)    Date/Time Action Medication Dose Rate   04/06/17 0107 New Bag/Given   vancomycin (VANCOCIN) IVPB 1000 mg/200 mL premix 1,000 mg 200 mL/hr   04/06/17 0740 New Bag/Given   vancomycin (VANCOCIN) IVPB 1000 mg/200 mL premix 1,000 mg 200 mL/hr   04/06/17 1200 New Bag/Given   piperacillin-tazobactam (ZOSYN) IVPB 3.375 g 3.375 g 12.5 mL/hr   04/06/17 2030 New Bag/Given   vancomycin (VANCOCIN) IVPB 1000 mg/200 mL premix 1,000 mg 200 mL/hr   04/06/17 2155 New Bag/Given   piperacillin-tazobactam (ZOSYN) IVPB 3.375 g 3.375 g 12.5 mL/hr   04/07/17 0523 New Bag/Given   piperacillin-tazobactam (ZOSYN) IVPB 3.375 g 3.375 g 12.5 mL/hr   04/07/17 6213 New Bag/Given   vancomycin (VANCOCIN) IVPB 1000 mg/200 mL premix 1,000 mg 200 mL/hr   04/07/17 1403 New Bag/Given   piperacillin-tazobactam (ZOSYN) IVPB 3.375 g 3.375 g 12.5 mL/hr       Objective: Vital signs in last 24 hours: Temp:  [98 F (36.7 C)-98.6 F (37 C)] 98 F (36.7 C) (03/12 0751) Pulse Rate:  [53-58] 57 (03/12 0751) Resp:  [16-18] 16 (03/12 0751) BP: (91-101)/(47-74) 101/65 (03/12 0751) SpO2:  [98 %-100 %] 100 % (03/12 0751) Weight:  [57.6 kg (126 lb 15.8 oz)] 57.6 kg (126 lb 15.8 oz) (03/12 0424) Constitutional:  oriented to person, place, and time. appears well-developed and well-nourished. No distress.  HENT: [place]/AT, PERRLA, no scleral icterus Mouth/Throat: Oropharynx is clear and moist. No oropharyngeal exudate.  Cardiovascular: Normal rate, regular rhythm and normal heart sounds. Exam reveals no gallop and no friction rub.  No murmur heard.  Pulmonary/Chest: Effort  normal and breath sounds normal. No respiratory distress.  has no wheezes.  Neck = supple, no nuchal rigidity Abdominal: Soft. Bowel sounds are normal.  exhibits no distension. There is no tenderness.  Lymphadenopathy: no cervical adenopathy. No axillary adenopathy Neurological: alert and oriented to person, place, and time.  Skin: R wrist with swelling over dorsum of hand and wrist. Removed gauze and packing - no pus    Lab Results Recent Labs    04/05/17 2040  WBC 8.2  HGB 12.7  HCT 38.7  NA 137  K 3.7  CL 104  CO2 27  BUN 19  CREATININE 0.68    Microbiology: Results for orders placed or performed during the hospital encounter of 04/06/17  Aerobic Culture (superficial specimen)     Status: None (Preliminary result)   Collection Time: 04/06/17  1:38 PM  Result Value Ref Range Status   Specimen Description   Final    WRIST Performed at [facility], [address], [place], [place] [address]    Special Requests   Final    Normal Performed at [facility], [address] [place], [place] [address]    Gram Stain   Final    FEW WBC PRESENT, PREDOMINANTLY PMN NO ORGANISMS SEEN    Culture   Final    CULTURE REINCUBATED FOR BETTER GROWTH Performed at [facility], [address], [place], [place] [address]    Report Status PENDING  Incomplete  Studies/Results: No results found.  Assessment/Plan: Junie Panningoel Bakula is a 42 y.o. female with R wrist abscess now s/p I and D. Culture sent yesterday . Repacked.   Discussed will need to elevate arm, likely need 1 more days IV abx. Change packing daily.  Recommendations Cont vanco and zosyn Elevate arm If improving tomorrow would dc on oral bactrim DS bid for 10 days total treatment and will need to fu on cx Discussed likley recurrent MRSA infections and discussed hibiclens and bactroban to prevent recurrences.  Thank you very much for the consult. Will follow with you.  Mick SellDavid P  Nicholaos Schippers   04/07/2017, 3:19 PM

## 2017-04-07 NOTE — Progress Notes (Signed)
[facility] [place] [facility]   PATIENT NAME: Susan Terry    MR#:  [id number]  DATE OF BIRTH:  May 18, 1975  SUBJECTIVE:  CHIEF COMPLAINT:   Chief Complaint  Patient presents with  . Recurrent Skin Infections   -  Recurrent right hand infections - welling and edema of the right hand is improving. There is still firmness and tenderness around the incised wound  REVIEW OF SYSTEMS:  Review of Systems  Constitutional: Negative for chills, fever and malaise/fatigue.  HENT: Negative for congestion, ear discharge, hearing loss and nosebleeds.   Eyes: Negative for blurred vision and double vision.  Respiratory: Negative for cough, shortness of breath and wheezing.   Cardiovascular: Negative for chest pain, palpitations and leg swelling.  Gastrointestinal: Negative for abdominal pain, constipation, diarrhea, nausea and vomiting.  Genitourinary: Negative for dysuria.  Musculoskeletal: Positive for myalgias.  Neurological: Negative for dizziness, speech change, focal weakness, seizures and headaches.  Psychiatric/Behavioral: Negative for depression.    DRUG ALLERGIES:  No Known Allergies  VITALS:  Blood pressure 101/65, pulse (!) 57, temperature 98 F (36.7 C), temperature source Oral, resp. rate 16, height 5\' 7"  (1.702 m), weight 57.6 kg (126 lb 15.8 oz), last menstrual period 04/06/2017, SpO2 100 %.  PHYSICAL EXAMINATION:  Physical Exam  GENERAL:  42 y.o.-year-old patient lying in the bed with no acute distress.  EYES: Pupils equal, round, reactive to light and accommodation. No scleral icterus. Extraocular muscles intact.  HEENT: Head atraumatic, normocephalic. Oropharynx and nasopharynx clear.  NECK:  Supple, no jugular venous distention. No thyroid enlargement, no tenderness.  LUNGS: Normal breath sounds bilaterally, no wheezing, rales,rhonchi or crepitation. No use of accessory muscles of respiration.  CARDIOVASCULAR: S1, S2 normal. No murmurs, rubs,  or gallops.  ABDOMEN: Soft, nontender, nondistended. Bowel sounds present. No organomegaly or mass.  EXTREMITIES: right dorsum of hand is swollen, distal forearm with a open wound with packing, redness and tenderness around it. No fluctuance. No pedal edema, cyanosis, or clubbing.  NEUROLOGIC: Cranial nerves II through XII are intact. Muscle strength 5/5 in all extremities. Sensation intact. Gait not checked.  PSYCHIATRIC: The patient is alert and oriented x 3.  SKIN: No obvious rash, lesion, or ulcer.    LABORATORY PANEL:   CBC Recent Labs  Lab 04/05/17 2040  WBC 8.2  HGB 12.7  HCT 38.7  PLT 187   ------------------------------------------------------------------------------------------------------------------  Chemistries  Recent Labs  Lab 04/05/17 2040  NA 137  K 3.7  CL 104  CO2 27  GLUCOSE 122*  BUN 19  CREATININE 0.68  CALCIUM 8.9  AST 22  ALT 16  ALKPHOS 70  BILITOT 0.7   ------------------------------------------------------------------------------------------------------------------  Cardiac Enzymes No results for input(s): TROPONINI in the last 168 hours. ------------------------------------------------------------------------------------------------------------------  RADIOLOGY:  No results found.  EKG:  No orders found for this or any previous visit.  ASSESSMENT AND PLAN:   42 year old female with no past medical history presents to hospital secondary to recurrent right hand abscess.  1. Recurrent right forearm abscess with hand cellulitis-not in the exact same location, she had her prior infection on the dorsum of the hand. -Improving swelling of the dorsum of the hand, also has a open wound on the distal forearm with packing - appreciate surgical consult, no further I&D needed. Continue IV antibiotics today as well -on vancomycin and Zosyn. -ID consult is appreciated -pain control, keep the hand elevated  2. DVT prophylaxis-encourage  ambulation  If improving, possible discharge tomorrow  All the records are reviewed and case discussed with Care Management/Social Workerr. Management plans discussed with the patient, family and they are in agreement.  CODE STATUS: Full Code  TOTAL TIME TAKING CARE OF THIS PATIENT: 36 minutes.   POSSIBLE D/C TOMORROW, DEPENDING ON CLINICAL CONDITION.   Enid Baas M.D on 04/07/2017 at 12:40 PM  Between 7am to 6pm - Pager - [phone]  After 6pm go to [email or web address] - password [email or web address]  [facility]  Office  [phone]  CC: Primary care physician; Patient, No Pcp Per

## 2017-04-07 NOTE — Progress Notes (Signed)
Pharmacy Antibiotic Note  Susan Terry is a 42 y.o. female admitted on 04/06/2017 with cellulitis/abscess.  Pharmacy has been consulted for vancomycin and piperacillin/tazobactam dosing.  Plan: Continue piperacillin/tazobactam 3.375 g IV q8h EI  VT = 14 mcg/mL is therapeutic. Level is slightly prior to steady state. Will continue vancomycin 1 g IV q12h  Height: 5\' 7"  (170.2 cm) Weight: 126 lb 15.8 oz (57.6 kg) IBW/kg (Calculated) : 61.6  Temp (24hrs), Avg:98.1 F (36.7 C), Min:98 F (36.7 C), Max:98.3 F (36.8 C)  Recent Labs  Lab 04/05/17 2040 04/07/17 1927  WBC 8.2  --   CREATININE 0.68 0.76  VANCOTROUGH  --  14*    Estimated Creatinine Clearance: 85.9 mL/min (by C-G formula based on SCr of 0.76 mg/dL).    No Known Allergies  Antimicrobials this admission:  vancomycin 3/11 >>   Zosyn 3/11 >>   Dose adjustments this admission:  Microbiology results: No micro  Thank you for allowing pharmacy to be a part of this patient's care.  Cindi CarbonMary M Danaye Sobh, PharmD, BCPS Clinical Pharmacist 04/07/2017 8:03 PM

## 2017-04-08 LAB — BASIC METABOLIC PANEL
Anion gap: 7 (ref 5–15)
BUN: 11 mg/dL (ref 6–20)
CALCIUM: 9 mg/dL (ref 8.9–10.3)
CHLORIDE: 102 mmol/L (ref 101–111)
CO2: 26 mmol/L (ref 22–32)
CREATININE: 0.74 mg/dL (ref 0.44–1.00)
GFR calc non Af Amer: >60 mL/min (ref >60)
GLUCOSE: 96 mg/dL (ref 65–99)
Potassium: 4.1 mmol/L (ref 3.5–5.1)
Sodium: 135 mmol/L (ref 135–145)

## 2017-04-08 MED ORDER — CHLORHEXIDINE GLUCONATE 4 % EX LIQD: Freq: Every day | CUTANEOUS | 0 refills | Status: DC | PRN

## 2017-04-08 MED ORDER — SULFAMETHOXAZOLE-TRIMETHOPRIM 800-160 MG PO TABS: 1.0000 | ORAL_TABLET | Freq: Two times a day (BID) | ORAL | 0 refills | Status: DC

## 2017-04-08 MED ORDER — SULFAMETHOXAZOLE-TRIMETHOPRIM 800-160 MG PO TABS: 1.0000 | ORAL_TABLET | Freq: Two times a day (BID) | ORAL | 0 refills | Status: AC

## 2017-04-08 MED ORDER — HYDROCODONE-ACETAMINOPHEN 5-325 MG PO TABS: 1.0000 | ORAL_TABLET | Freq: Four times a day (QID) | ORAL | 0 refills | Status: DC | PRN

## 2017-04-08 MED ORDER — MUPIROCIN CALCIUM 2 % EX CREA: TOPICAL_CREAM | CUTANEOUS | 0 refills | Status: AC

## 2017-04-08 MED ORDER — MUPIROCIN CALCIUM 2 % EX CREA: TOPICAL_CREAM | CUTANEOUS | 0 refills | Status: DC
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
## 2017-04-08 NOTE — Progress Notes (Signed)
Patient is being discharged home. Clean dressing in place. Sent home with Elma cream, Hibiclens, and script for Norco. Reviewed meds, last dose given and script. Allowed time for questions. Waiting on ride at this time. IV removed with cath intact.
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
## 2017-04-09 LAB — AEROBIC CULTURE  (SUPERFICIAL SPECIMEN)

## 2017-04-09 LAB — AEROBIC CULTURE W GRAM STAIN (SUPERFICIAL SPECIMEN): Special Requests: NORMAL

## 2017-04-14 ENCOUNTER — Telehealth: Payer: Self-pay

## 2017-04-14 NOTE — Telephone Encounter (Signed)
Lm to call back if [facility]

## 2017-04-19 DIAGNOSIS — L02413 Cutaneous abscess of right upper limb: Secondary | ICD-10-CM

## 2017-05-31 ENCOUNTER — Encounter: Payer: Self-pay | Admitting: Emergency Medicine

## 2017-05-31 ENCOUNTER — Emergency Department
Admission: EM | Admit: 2017-05-31 | Discharge: 2017-06-01 | Disposition: A | Discharge status: Home / Self Care | Payer: Medicaid - Out of State | Attending: Emergency Medicine | Admitting: Emergency Medicine

## 2017-05-31 ENCOUNTER — Other Ambulatory Visit: Payer: Self-pay

## 2017-05-31 DIAGNOSIS — F17210 Nicotine dependence, cigarettes, uncomplicated: Secondary | ICD-10-CM | POA: Insufficient documentation

## 2017-05-31 DIAGNOSIS — X58XXXA Exposure to other specified factors, initial encounter: Secondary | ICD-10-CM | POA: Insufficient documentation

## 2017-05-31 DIAGNOSIS — M791 Myalgia, unspecified site: Secondary | ICD-10-CM | POA: Insufficient documentation

## 2017-05-31 DIAGNOSIS — T7621XA Adult sexual abuse, suspected, initial encounter: Secondary | ICD-10-CM | POA: Diagnosis present

## 2017-05-31 DIAGNOSIS — R1030 Lower abdominal pain, unspecified: Secondary | ICD-10-CM | POA: Diagnosis not present

## 2017-05-31 DIAGNOSIS — N390 Urinary tract infection, site not specified: Secondary | ICD-10-CM | POA: Insufficient documentation

## 2017-05-31 DIAGNOSIS — T148XXA Other injury of unspecified body region, initial encounter: Secondary | ICD-10-CM | POA: Insufficient documentation

## 2017-05-31 DIAGNOSIS — T7421XA Adult sexual abuse, confirmed, initial encounter: Secondary | ICD-10-CM | POA: Insufficient documentation

## 2017-05-31 DIAGNOSIS — Y998 Other external cause status: Secondary | ICD-10-CM | POA: Diagnosis not present

## 2017-05-31 DIAGNOSIS — Y9389 Activity, other specified: Secondary | ICD-10-CM | POA: Insufficient documentation

## 2017-05-31 DIAGNOSIS — N939 Abnormal uterine and vaginal bleeding, unspecified: Secondary | ICD-10-CM | POA: Insufficient documentation

## 2017-05-31 DIAGNOSIS — Y929 Unspecified place or not applicable: Secondary | ICD-10-CM | POA: Insufficient documentation

## 2017-05-31 LAB — URINALYSIS, ROUTINE W REFLEX MICROSCOPIC
Bilirubin Urine: NEGATIVE
GLUCOSE, UA: NEGATIVE mg/dL
Hgb urine dipstick: NEGATIVE
KETONES UR: NEGATIVE mg/dL
NITRITE: NEGATIVE
PH: 6 (ref 5.0–8.0)
Protein, ur: NEGATIVE mg/dL
Specific Gravity, Urine: 1.015 (ref 1.005–1.030)

## 2017-05-31 LAB — PREGNANCY, URINE: Preg Test, Ur: NEGATIVE

## 2017-05-31 MED ORDER — AZITHROMYCIN 500 MG PO TABS
1000.0000 mg | ORAL_TABLET | Freq: Once | ORAL | Status: AC
  Administered 2017-06-01: 1000 mg via ORAL

## 2017-05-31 MED ORDER — ELVITEG-COBIC-EMTRICIT-TENOFAF 150-150-200-10 MG PO TABS: 1.0000 | ORAL_TABLET | Freq: Every day | ORAL | 0 refills | Status: DC

## 2017-05-31 MED ORDER — ACETAMINOPHEN 500 MG PO TABS
1000.0000 mg | ORAL_TABLET | Freq: Once | ORAL | Status: AC
  Administered 2017-05-31: 1000 mg via ORAL
  Filled 2017-05-31: qty 2

## 2017-05-31 MED ORDER — ULIPRISTAL ACETATE 30 MG PO TABS
30.0000 mg | ORAL_TABLET | Freq: Once | ORAL | Status: AC
  Administered 2017-06-01: 30 mg via ORAL

## 2017-05-31 MED ORDER — LIDOCAINE HCL (PF) 1 % IJ SOLN: 0.9000 mL | Freq: Once | INTRAMUSCULAR | Status: DC

## 2017-05-31 MED ORDER — AZITHROMYCIN 500 MG PO TABS
ORAL_TABLET | ORAL | Status: AC
  Filled 2017-05-31: qty 2

## 2017-05-31 MED ORDER — CEFTRIAXONE SODIUM 250 MG IJ SOLR
250.0000 mg | Freq: Once | INTRAMUSCULAR | Status: DC
  Filled 2017-05-31: qty 250

## 2017-05-31 MED ORDER — CEPHALEXIN 500 MG PO CAPS: 500.0000 mg | ORAL_CAPSULE | Freq: Three times a day (TID) | ORAL | 0 refills | Status: DC

## 2017-05-31 MED ORDER — ELVITEG-COBIC-EMTRICIT-TENOFAF 150-150-200-10 MG PO TABS
1.0000 | ORAL_TABLET | Freq: Every day | ORAL | Status: DC
  Administered 2017-06-01: 1 via ORAL
  Filled 2017-05-31 (×4): qty 1
  Filled 2017-05-31: qty 5
  Filled 2017-05-31 (×7): qty 1

## 2017-05-31 MED ORDER — ULIPRISTAL ACETATE 30 MG PO TABS
ORAL_TABLET | ORAL | Status: AC
  Filled 2017-05-31: qty 1

## 2017-05-31 MED ORDER — LIDOCAINE HCL (PF) 1 % IJ SOLN
INTRAMUSCULAR | Status: AC
  Filled 2017-05-31: qty 5

## 2017-05-31 MED ORDER — METRONIDAZOLE 500 MG PO TABS
2000.0000 mg | ORAL_TABLET | Freq: Once | ORAL | Status: AC
  Administered 2017-06-01: 2000 mg via ORAL
  Filled 2017-05-31: qty 4

## 2017-05-31 MED ORDER — IBUPROFEN 400 MG PO TABS
600.0000 mg | ORAL_TABLET | Freq: Once | ORAL | Status: AC
Start: 2017-05-31 — End: 2017-05-31
  Administered 2017-05-31: 600 mg via ORAL
  Filled 2017-05-31: qty 2
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
## 2017-06-01 LAB — COMPREHENSIVE METABOLIC PANEL
ALK PHOS: 62 U/L (ref 38–126)
ALT: 29 U/L (ref 14–54)
ANION GAP: 7 (ref 5–15)
AST: 33 U/L (ref 15–41)
Albumin: 3.6 g/dL (ref 3.5–5.0)
BUN: 19 mg/dL (ref 6–20)
CALCIUM: 8.9 mg/dL (ref 8.9–10.3)
CHLORIDE: 101 mmol/L (ref 101–111)
CO2: 26 mmol/L (ref 22–32)
Creatinine, Ser: 0.71 mg/dL (ref 0.44–1.00)
Glucose, Bld: 139 mg/dL — ABNORMAL HIGH (ref 65–99)
Potassium: 3.2 mmol/L — ABNORMAL LOW (ref 3.5–5.1)
SODIUM: 134 mmol/L — AB (ref 135–145)
Total Bilirubin: 0.5 mg/dL (ref 0.3–1.2)
Total Protein: 7.8 g/dL (ref 6.5–8.1)

## 2017-06-01 LAB — RAPID HIV SCREEN (HIV 1/2 AB+AG)
HIV 1/2 Antibodies: NONREACTIVE
HIV-1 P24 ANTIGEN - HIV24: NONREACTIVE

## 2017-06-01 MED FILL — GENVOYA TABLET: 150-150-200 | 23 days supply | Qty: 23 | Fill #0

## 2017-06-02 LAB — HEPATITIS C ANTIBODY

## 2017-06-02 LAB — URINE CULTURE: SPECIAL REQUESTS: NORMAL

## 2017-06-02 LAB — HEPATITIS B SURFACE ANTIGEN: HEP B S AG: NEGATIVE

## 2017-06-02 LAB — RPR: RPR Ser Ql: NONREACTIVE

## 2017-06-02 IMAGING — US US EXTREM UP *R* LTD
1 series · 8 of 8 positions shown · non-contrast
Comparison: None.

CLINICAL DATA: Right hand soreness and swelling beginning
yesterday.

EXAM:
ULTRASOUND RIGHT HAND LIMITED
TECHNIQUE: Ultrasound examination of the upper extremity soft tissues was
performed in the area of clinical concern.

[Series 1: us extrem up *right* ltd · 0.07mm/px · 8 acquisitions, 8 frames shown]
[im 1/8]
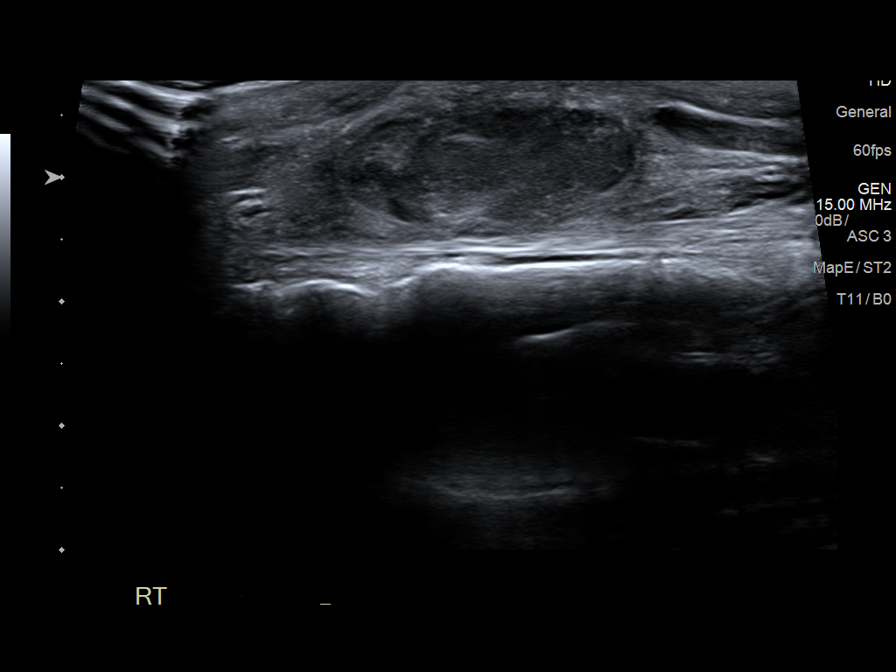
[im 2/8]
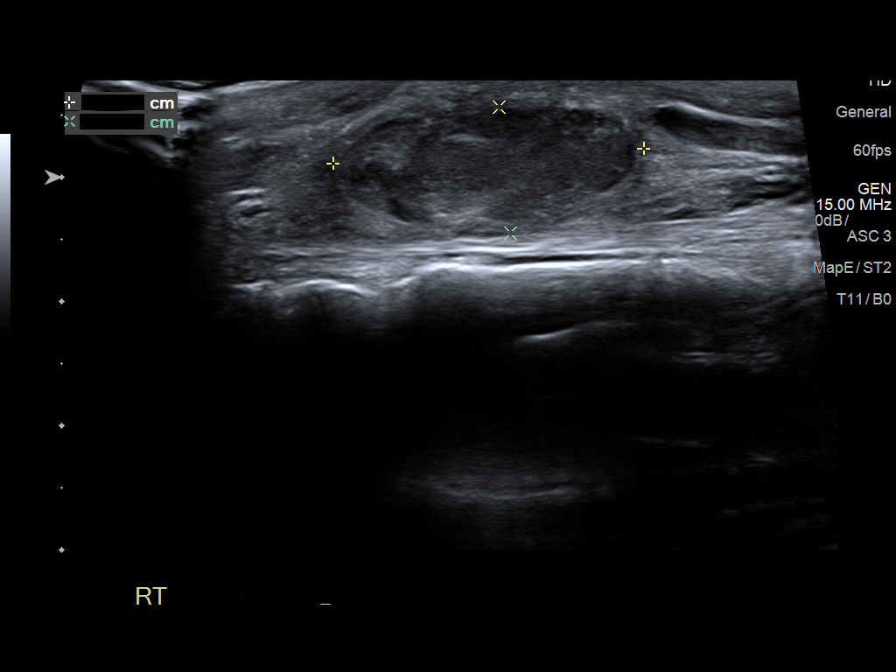
[im 3/8]
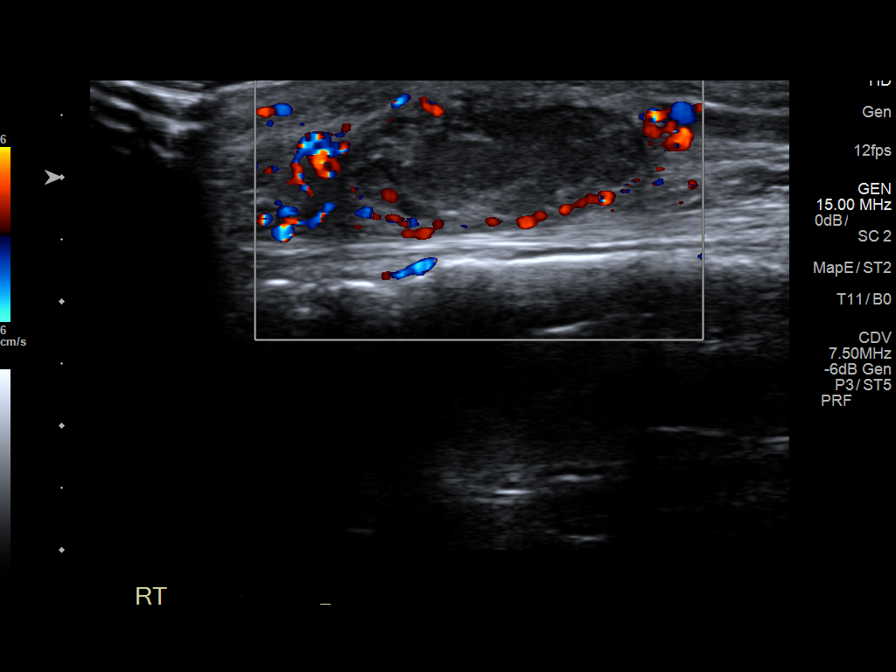
[im 4/8]
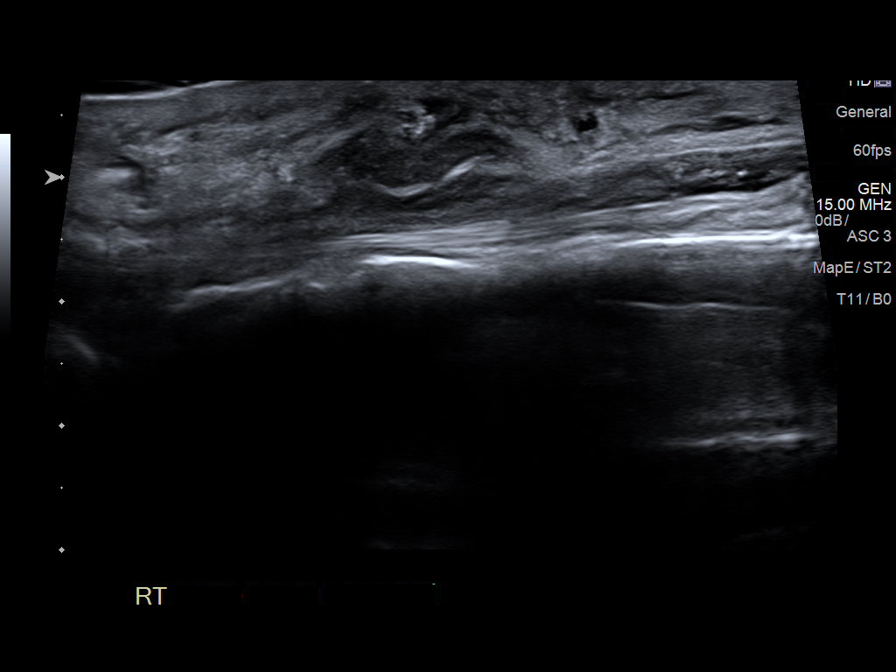
[im 5/8]
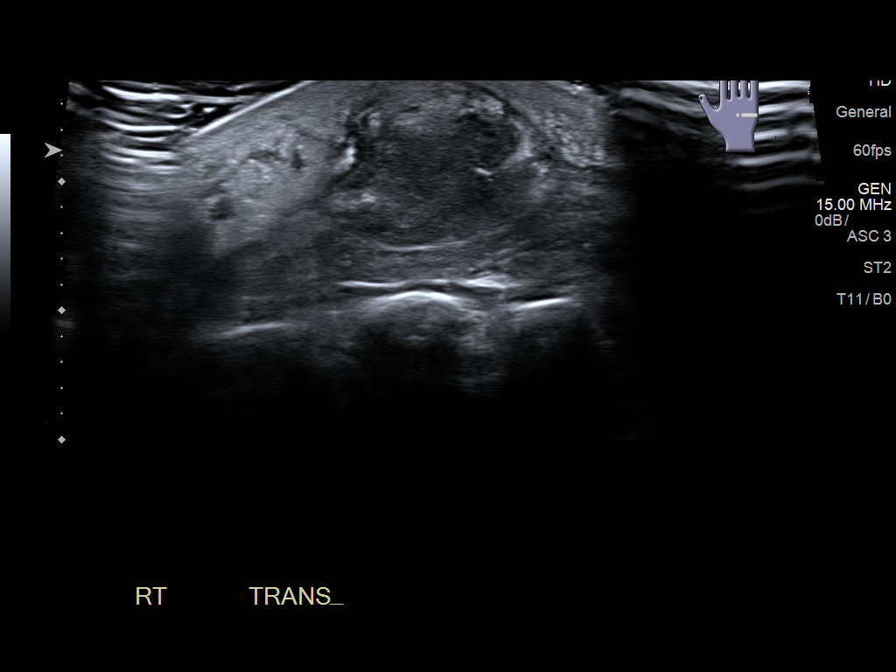
[im 6/8]
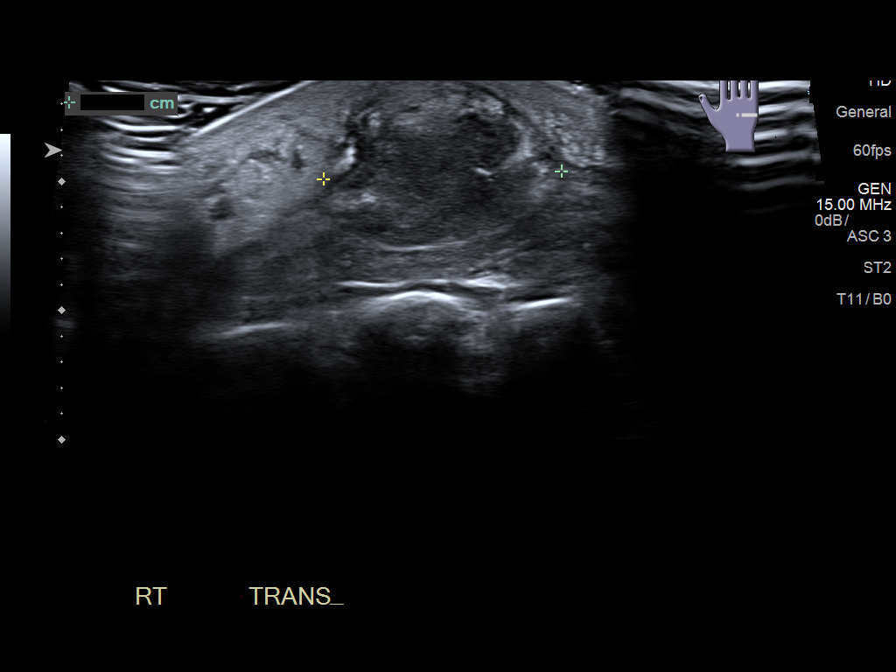
[im 7/8]
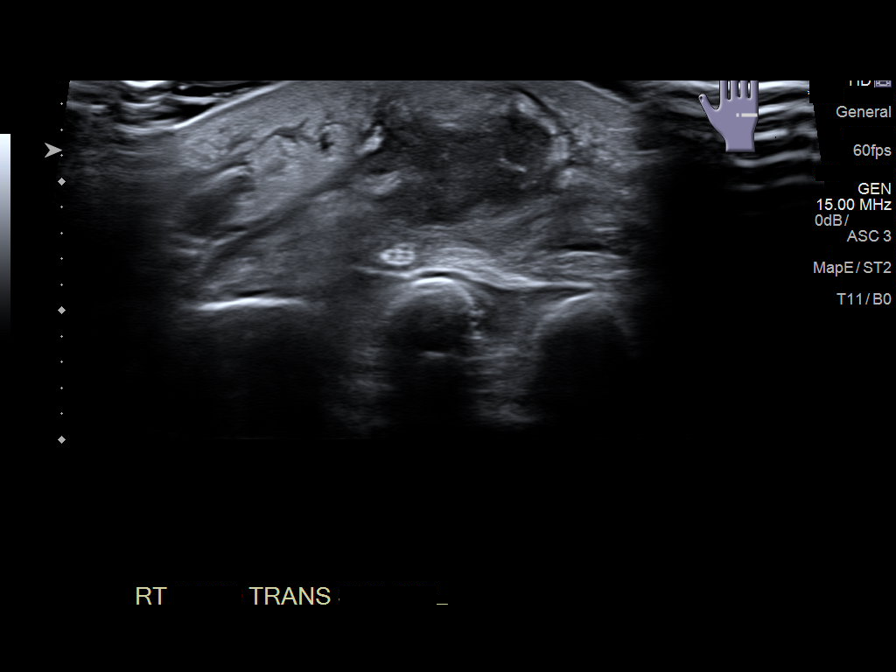
[im 8/8]
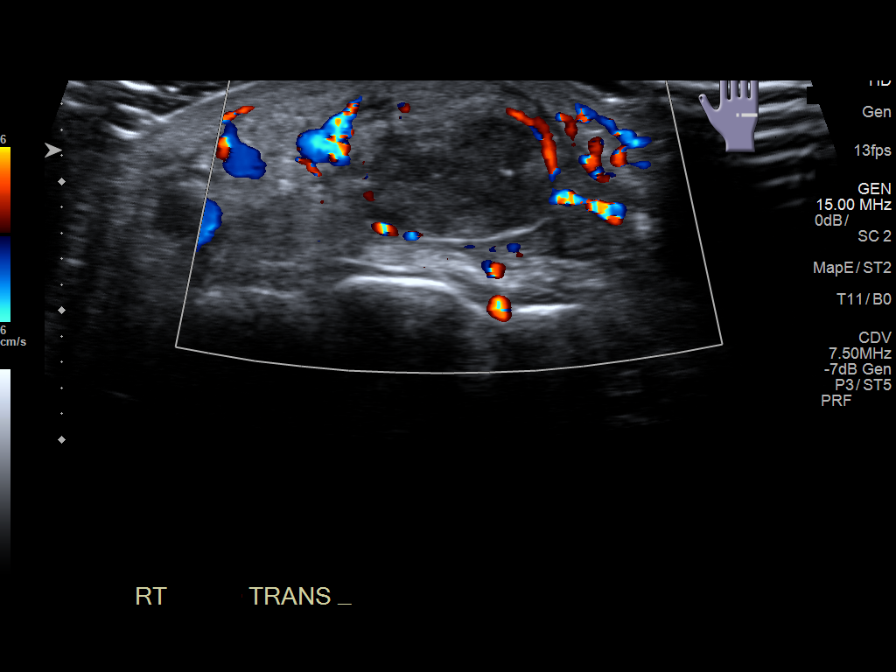

[8 of 8 positions shown; findings below may reference images not displayed]

FINDINGS: Scanning was directed to the region of concern on the dorsal aspect
of the right hand at approximately the level of the fourth and fifth
metacarpals. A well-circumscribed lesion which is predominantly
hypoechoic is identified which measures 2.5 cm craniocaudal by
cm AP by 1.9 cm transverse. Doppler imaging demonstrates flow about
but not within the lesion.
IMPRESSION: Well-circumscribed lesion on the dorsum of the right hand could be a
hematoma, abscess or soft tissue mass without internal blood flow.
It should be amenable to needle aspiration.

## 2017-06-02 NOTE — SANE Note (Signed)
On 06/02/2017 at 1530, I spoke with Marylene Land and received the following numbers:  PCN#: [id number]  BIN#: [id number]  GROUP#: [id number]  MEMBER ID#: [id number]  Marylene Land informed me that this script had already been filled by Hermine Messick.

## 2017-06-29 ENCOUNTER — Ambulatory Visit
Admission: RE | Admit: 2017-06-29 | Discharge: 2017-06-29 | Disposition: A | Discharge status: Home / Self Care | Payer: Disability Insurance | Source: Ambulatory Visit | Attending: Obstetrics and Gynecology | Admitting: Obstetrics and Gynecology

## 2017-06-29 ENCOUNTER — Ambulatory Visit
Admission: RE | Admit: 2017-06-29 | Discharge: 2017-06-29 | Disposition: A | Discharge status: Home / Self Care | Payer: Disability Insurance | Source: Ambulatory Visit | Attending: Family Medicine | Admitting: Family Medicine

## 2017-06-29 ENCOUNTER — Other Ambulatory Visit: Payer: Self-pay | Admitting: Family Medicine

## 2017-06-29 DIAGNOSIS — M5136 Other intervertebral disc degeneration, lumbar region: Secondary | ICD-10-CM | POA: Insufficient documentation

## 2017-06-29 DIAGNOSIS — M5440 Lumbago with sciatica, unspecified side: Secondary | ICD-10-CM

## 2017-06-29 DIAGNOSIS — M542 Cervicalgia: Secondary | ICD-10-CM | POA: Diagnosis present

## 2017-06-29 DIAGNOSIS — M50322 Other cervical disc degeneration at C5-C6 level: Secondary | ICD-10-CM | POA: Insufficient documentation

## 2017-06-29 DIAGNOSIS — M549 Dorsalgia, unspecified: Secondary | ICD-10-CM | POA: Diagnosis present

## 2017-06-29 IMAGING — CR DG LUMBAR SPINE 2-3V
3 series · 3 of 3 positions shown · non-contrast
Comparison: None.

CLINICAL DATA: Chronic lower back pain without known injury.

EXAM:
LUMBAR SPINE - 2-3 VIEW

[l-spine ap]
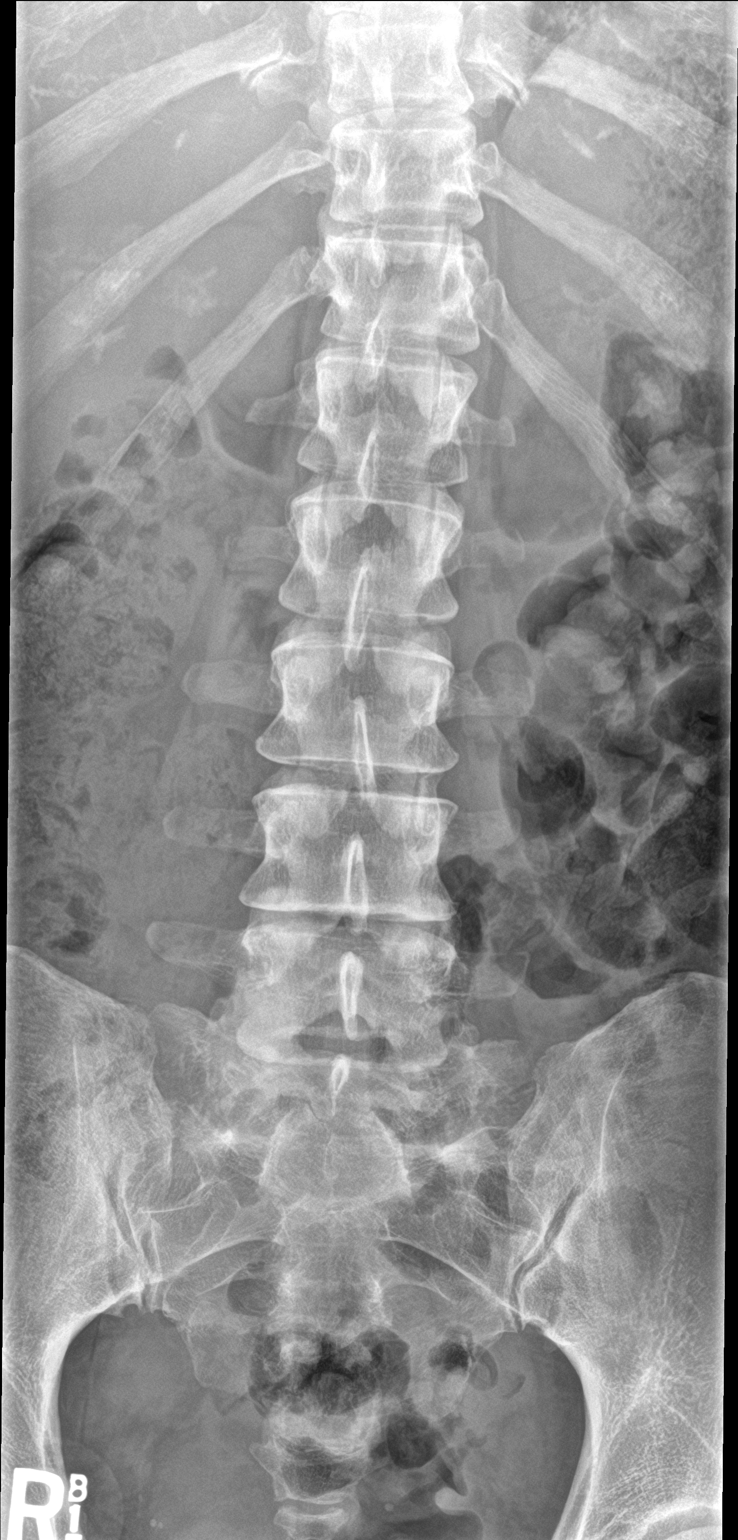

[l-spine lat]
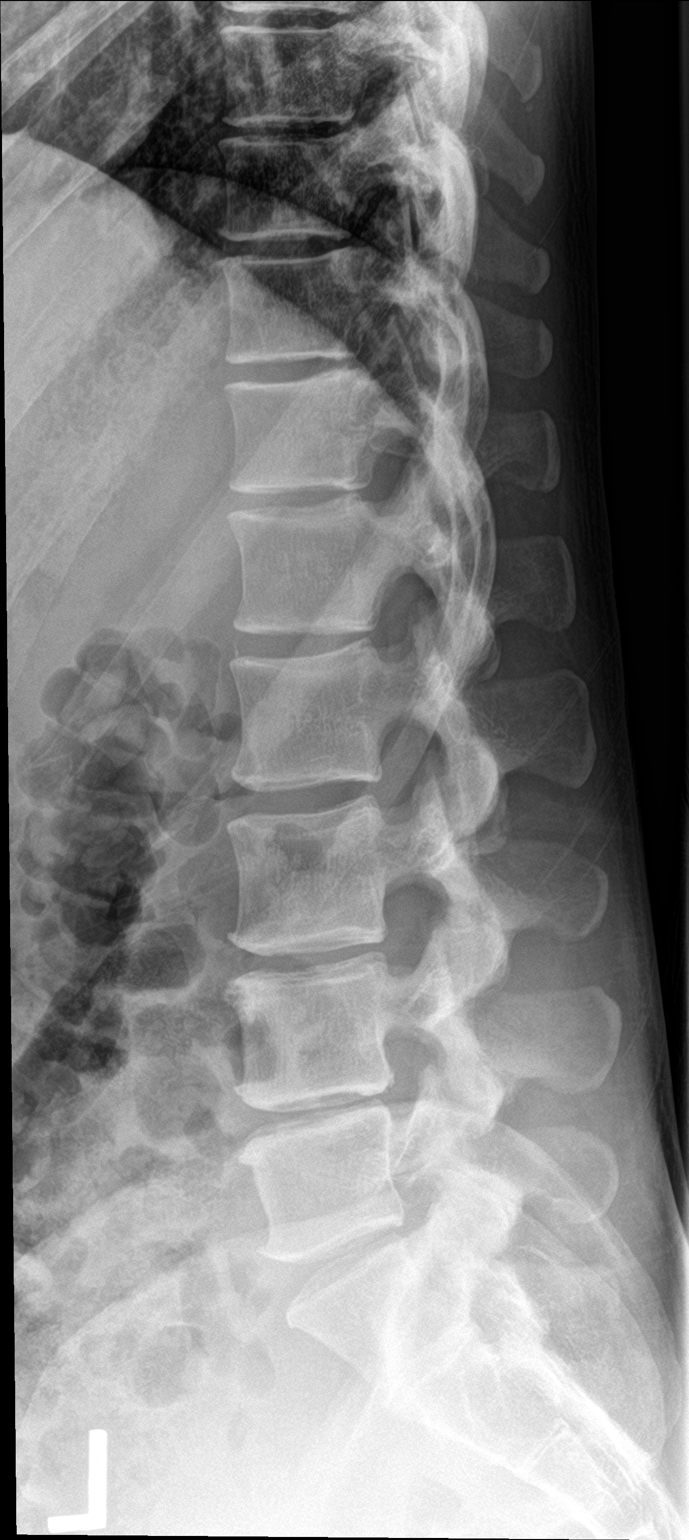

[l-spine spot]
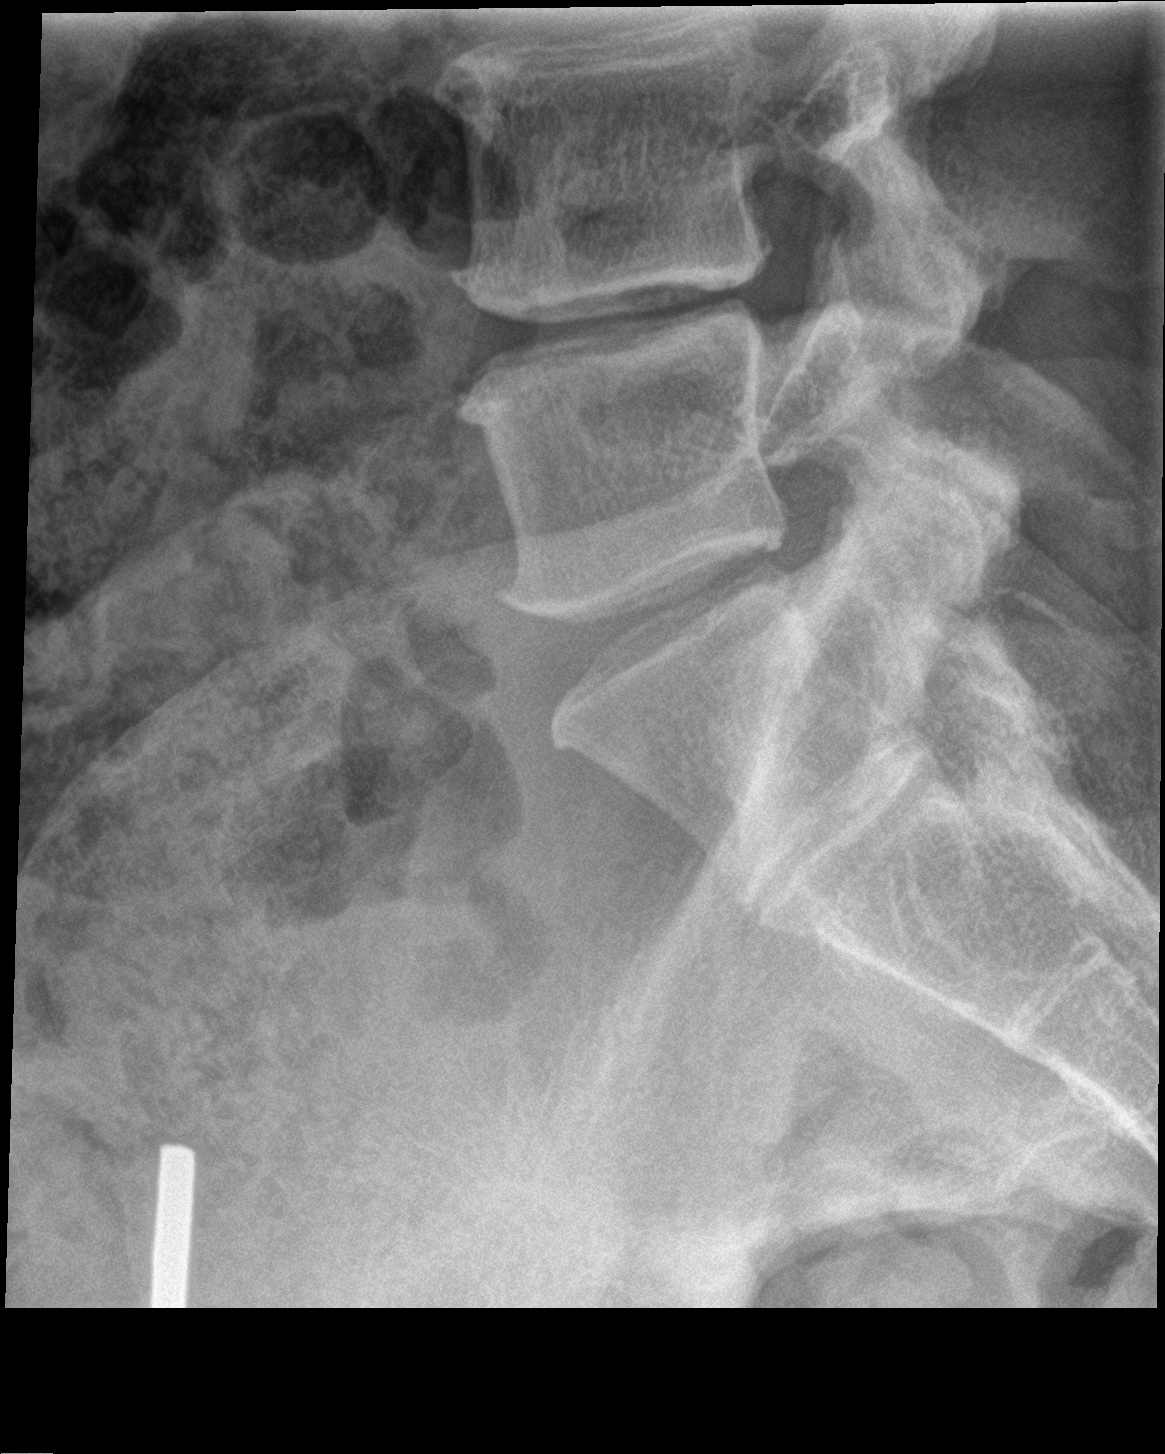

[3 of 3 positions shown; findings below may reference images not displayed]

FINDINGS: No fracture or spondylolisthesis is noted. Mild degenerative disc
disease is noted at L3-4 and L4-5.
IMPRESSION: Mild multilevel degenerative disc disease. No acute abnormality seen
in the lumbar spine.

## 2017-06-29 IMAGING — CR DG CERVICAL SPINE 2 OR 3 VIEWS
4 series · 4 of 4 positions shown · non-contrast
Comparison: None.

CLINICAL DATA: Chronic neck pain without known injury.

EXAM:
CERVICAL SPINE - 2-3 VIEW

[c-spine lat]
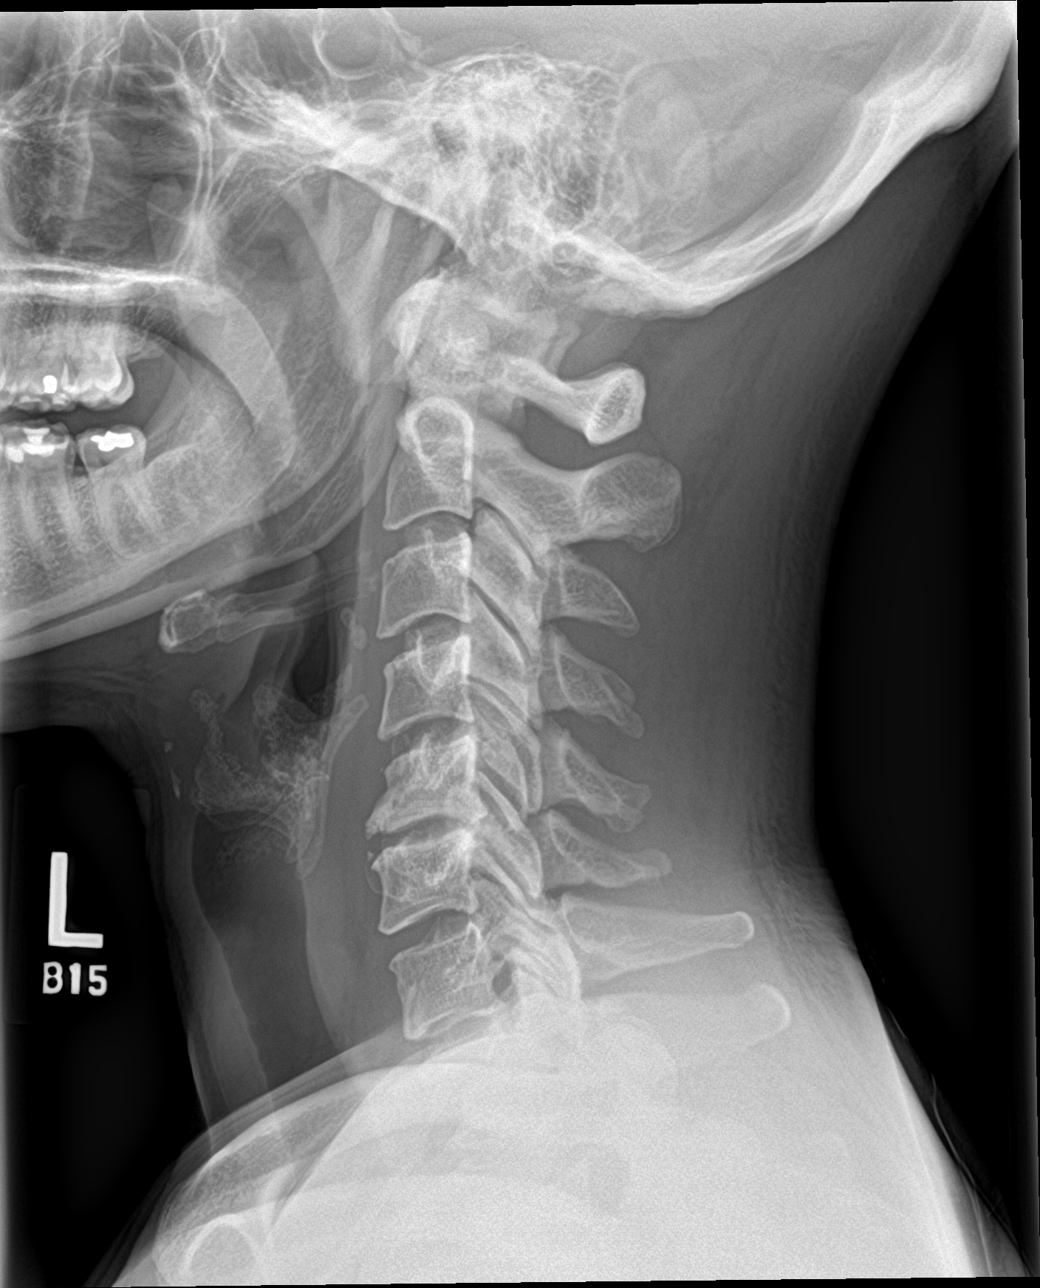

[c-spine ap]
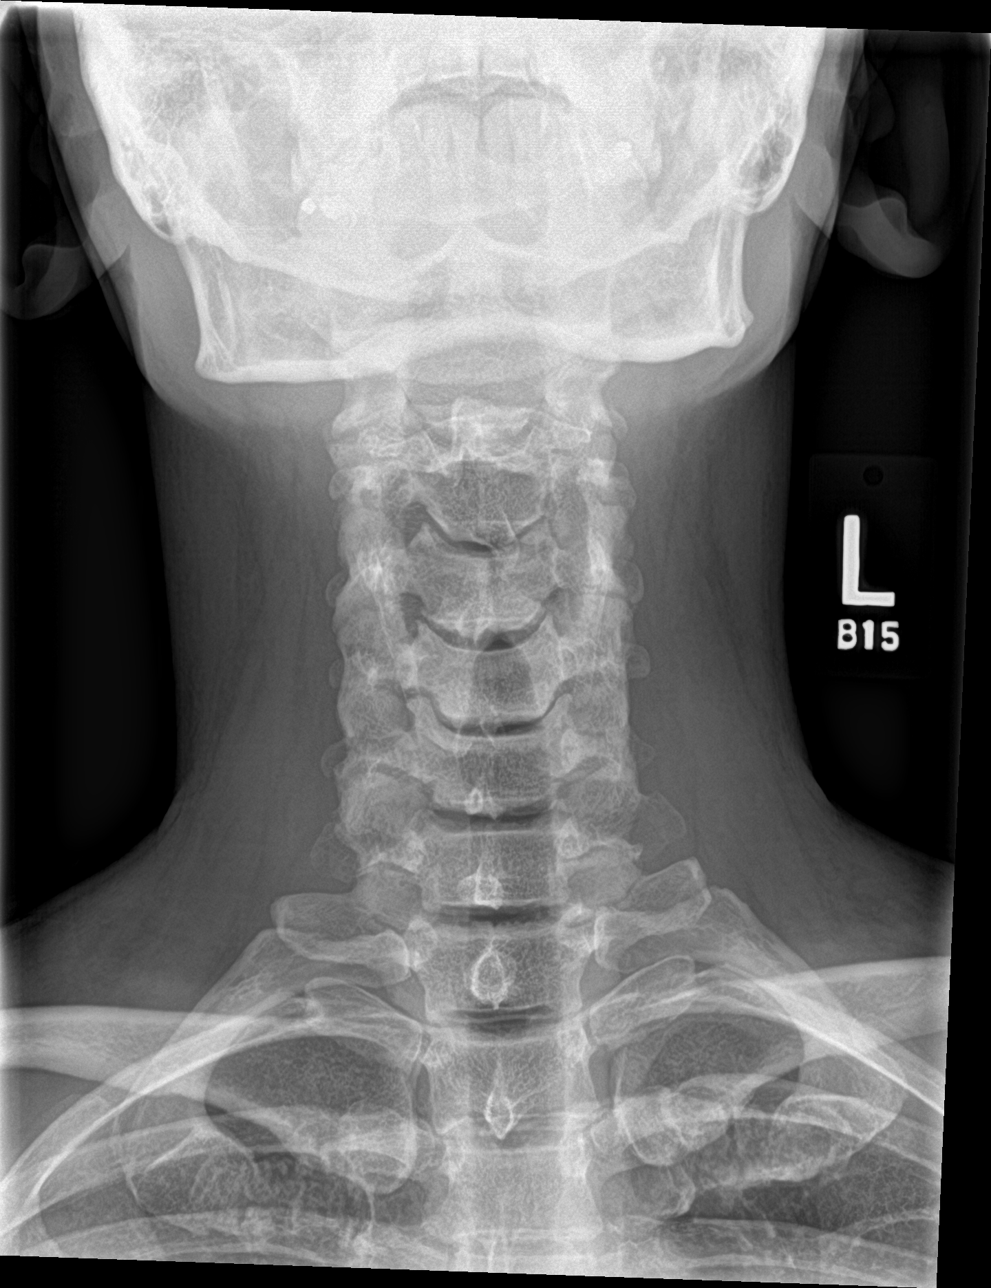

[c-spine open mouth]
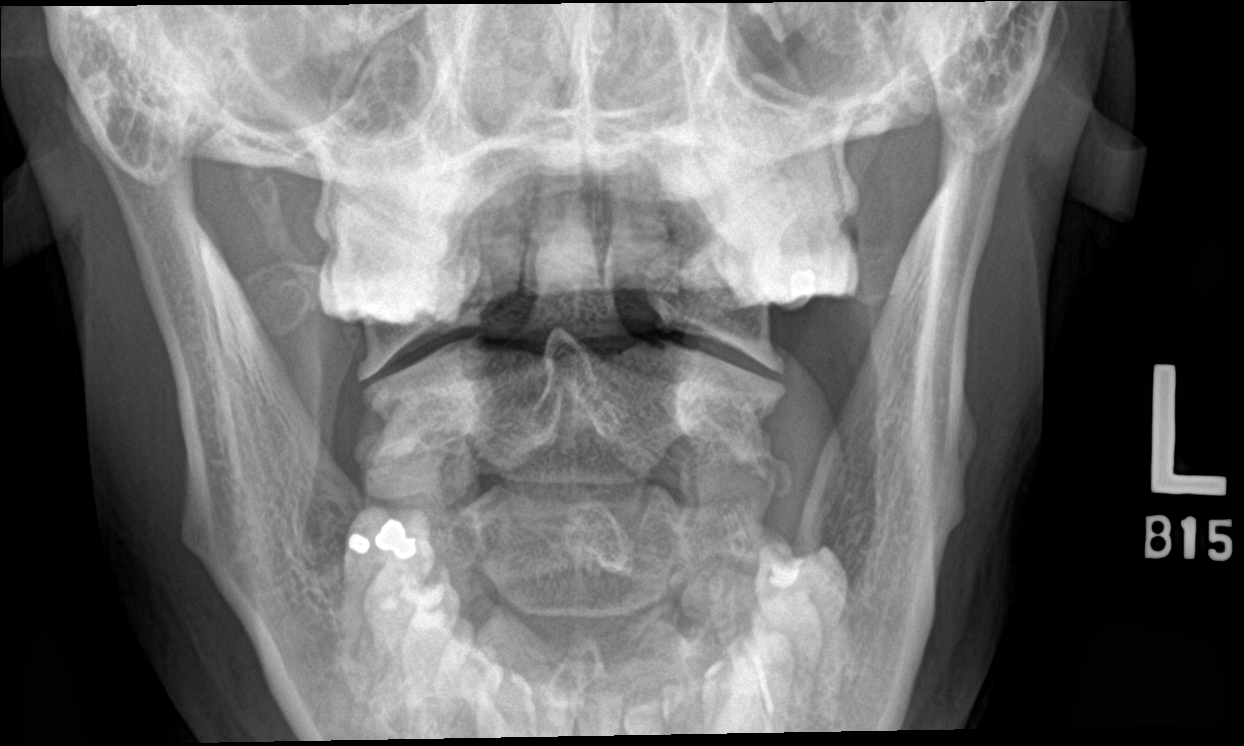

[c-spine swimmers]
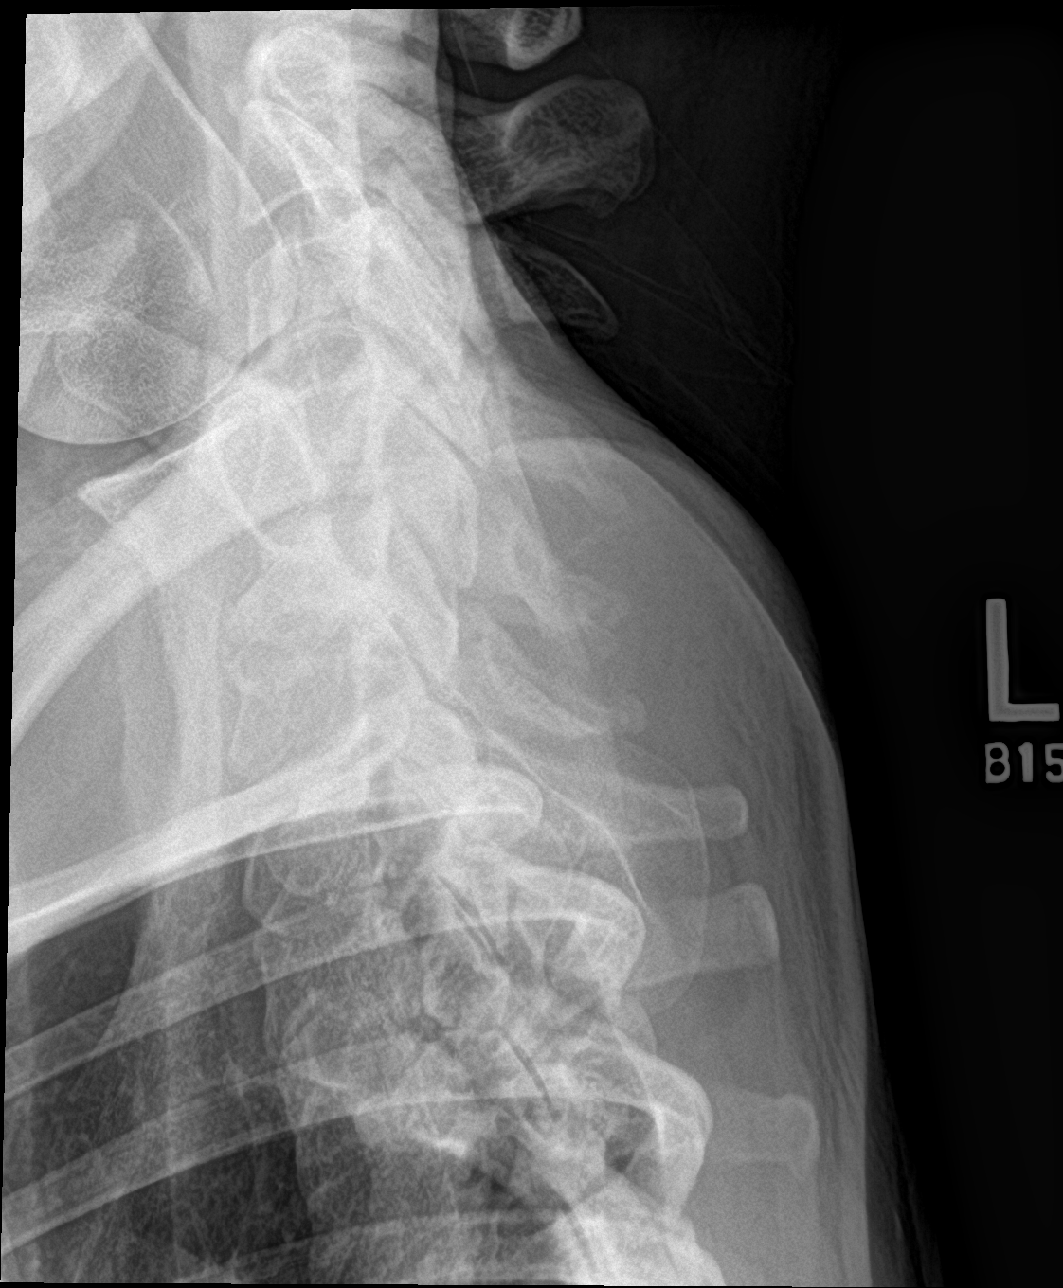

[4 of 4 positions shown; findings below may reference images not displayed]

FINDINGS: Minimal retrolisthesis is noted at C5-6 secondary to moderate
degenerative disc disease at this level. Anterior osteophyte
formation is noted. No fracture is noted. No prevertebral soft
tissue swelling is noted.
IMPRESSION: Moderate degenerative disc disease is noted at C5-6. No acute
abnormality seen in the cervical spine.

## 2017-11-06 DIAGNOSIS — Z8619 Personal history of other infectious and parasitic diseases: Secondary | ICD-10-CM | POA: Insufficient documentation

## 2018-02-25 ENCOUNTER — Other Ambulatory Visit: Payer: Self-pay

## 2018-02-25 ENCOUNTER — Emergency Department
Admission: EM | Admit: 2018-02-25 | Discharge: 2018-02-25 | Disposition: A | Discharge status: Home / Self Care | Payer: Disability Insurance | Attending: Emergency Medicine | Admitting: Emergency Medicine

## 2018-02-25 DIAGNOSIS — Z79899 Other long term (current) drug therapy: Secondary | ICD-10-CM | POA: Insufficient documentation

## 2018-02-25 DIAGNOSIS — M5432 Sciatica, left side: Secondary | ICD-10-CM

## 2018-02-25 DIAGNOSIS — M79662 Pain in left lower leg: Secondary | ICD-10-CM | POA: Insufficient documentation

## 2018-02-25 DIAGNOSIS — F17210 Nicotine dependence, cigarettes, uncomplicated: Secondary | ICD-10-CM | POA: Insufficient documentation

## 2018-02-25 MED ORDER — METHYLPREDNISOLONE 4 MG PO TBPK: ORAL_TABLET | ORAL | 0 refills | Status: DC

## 2018-02-25 MED ORDER — TRAMADOL HCL 50 MG PO TABS: 50.0000 mg | ORAL_TABLET | Freq: Two times a day (BID) | ORAL | 0 refills | Status: DC | PRN

## 2018-02-25 NOTE — ED Triage Notes (Signed)
Pt in with co left leg pain states from lower back down to left foot. Hx of the same but has never been seen for it. Worse when she walks or lays on left side.

## 2018-02-25 NOTE — ED Provider Notes (Signed)
[facility] Emergency Department Provider Note   ____________________________________________   First MD Initiated Contact with Patient 02/25/18 [phone]     (approximate)  I have reviewed the triage vital signs and the nursing notes.   HISTORY  Chief Complaint Leg Pain    HPI Susan Terry is a 43 y.o. female patient presents with radicular low back pain to the left lower extremity.  Patient states this is complaint is chronic for approximately a year.  Patient state no palliative measure for complaint.  Patient denies bladder or bowel dysfunction.  Patient describes her discomfort as a numbness that increased with ambulation.    No past medical history on file.  Patient Active Problem List   Diagnosis Date Noted  . Abscess of right forearm   . Cellulitis 04/06/2017    Past Surgical History:  Procedure Laterality Date  . INCISION AND DRAINAGE FOREARM / WRIST DEEP Right     Prior to Admission medications   Medication Sig Start Date End Date Taking? Authorizing Provider  cephALEXin (KEFLEX) 500 MG capsule Take 1 capsule (500 mg total) by mouth 3 (three) times daily. 05/31/17   Loleta Rose, MD  chlorhexidine (HIBICLENS) 4 % external liquid Apply topically daily as needed. 04/08/17   Enid Baas, MD  elvitegravir-cobicistat-emtricitabine-tenofovir (GENVOYA) 150-150-200-10 MG TABS tablet Take 1 tablet by mouth daily with breakfast. 05/31/17   Loleta Rose, MD  elvitegravir-cobicistat-emtricitabine-tenofovir (GENVOYA) 150-150-200-10 MG TABS tablet Take 1 tablet by mouth daily with breakfast. 05/31/17   Loleta Rose, MD  HYDROcodone-acetaminophen (NORCO/VICODIN) 5-325 MG tablet Take 1 tablet by mouth every 6 (six) hours as needed for moderate pain or severe pain. 04/08/17   Enid Baas, MD  methylPREDNISolone (MEDROL DOSEPAK) 4 MG TBPK tablet Take Tapered dose as directed 02/25/18   Joni Reining, PA-C  mupirocin cream Idelle Jo) 2 % Apply to  affected area 3 times daily 04/08/17 04/08/18  Enid Baas, MD  traMADol (ULTRAM) 50 MG tablet Take 1 tablet (50 mg total) by mouth every 12 (twelve) hours as needed. 02/25/18   Joni Reining, PA-C    Allergies Patient has no known allergies.  Family History  Problem Relation Age of Onset  . Cancer Mother        cancer throughout family    Social History Social History   Tobacco Use  . Smoking status: Current Every Day Smoker    Packs/day: 0.50    Types: Cigarettes  . Smokeless tobacco: Never Used  Substance Use Topics  . Alcohol use: No  . Drug use: No    Review of Systems  Constitutional: No fever/chills Eyes: No visual changes. ENT: No sore throat. Cardiovascular: Denies chest pain. Respiratory: Denies shortness of breath. Gastrointestinal: No abdominal pain.  No nausea, no vomiting.  No diarrhea.  No constipation. Genitourinary: Negative for dysuria. Musculoskeletal: Positive for back pain. Skin: Negative for rash. Neurological: Negative for headaches, but focal weakness/ numbness to left lower extremity.  ____________________________________________   PHYSICAL EXAM:  VITAL SIGNS: ED Triage Vitals  Enc Vitals Group     BP 02/25/18 0607 134/84     Pulse Rate 02/25/18 0607 99     Resp 02/25/18 0607 20     Temp 02/25/18 0607 98.2 F (36.8 C)     Temp Source 02/25/18 0607 Oral     SpO2 02/25/18 0607 100 %     Weight 02/25/18 0608 147 lb (66.7 kg)     Height 02/25/18 0608 5\' 7"  (1.702 m)  Head Circumference --      Peak Flow --      Pain Score 02/25/18 0607 7     Pain Loc --      Pain Edu? --      Excl. in GC? --     Constitutional: Alert and oriented. Well appearing and in no acute distress. Cardiovascular: Normal rate, regular rhythm. Grossly normal heart sounds.  Good peripheral circulation. Respiratory: Normal respiratory effort.  No retractions. Lungs CTAB. Gastrointestinal: Soft and nontender. No distention. No abdominal bruits. No CVA  tenderness. Musculoskeletal: No obvious spinal deformity.  Patient has decreased range of motion with extension.  Patient has a negative straight leg test in supine position.  No lower extremity tenderness nor edema.  No joint effusions. Neurologic:  Normal speech and language. No gross focal neurologic deficits are appreciated. No gait instability. Skin:  Skin is warm, dry and intact. No rash noted. Psychiatric: Mood and affect are normal. Speech and behavior are normal.  ____________________________________________   LABS (all labs ordered are listed, but only abnormal results are displayed)  Labs Reviewed - No data to display ____________________________________________  EKG   ____________________________________________  RADIOLOGY  ED MD interpretation:    Official radiology report(s): No results found.  ____________________________________________   PROCEDURES  Procedure(s) performed: None  Procedures  Critical Care performed: No  ____________________________________________   INITIAL IMPRESSION / ASSESSMENT AND PLAN / ED COURSE  As part of my medical decision making, I reviewed the following data within the electronic MEDICAL RECORD NUMBER     Patient complains consistent with sciatica.  Discussed previous x-ray findings with patient.  Patient given discharge care instruction advised follow orthopedic for definitive evaluation and treatment.  Take medication as directed.      ____________________________________________   FINAL CLINICAL IMPRESSION(S) / ED DIAGNOSES  Final diagnoses:  Sciatica of left side     ED Discharge Orders         Ordered    methylPREDNISolone (MEDROL DOSEPAK) 4 MG TBPK tablet     02/25/18 0729    traMADol (ULTRAM) 50 MG tablet  Every 12 hours PRN     02/25/18 0729           Note:  This document was prepared using Dragon voice recognition software and may include unintentional dictation errors.    Joni ReiningSmith, Lyrik Dockstader K,  PA-C 02/25/18 [id number]    Pershing ProudSchaevitz, Myra Rudeavid Matthew, MD 02/25/18 [phone]

## 2018-07-20 DIAGNOSIS — F17200 Nicotine dependence, unspecified, uncomplicated: Secondary | ICD-10-CM | POA: Diagnosis present

## 2018-07-20 DIAGNOSIS — Z720 Tobacco use: Secondary | ICD-10-CM | POA: Diagnosis present

## 2018-11-22 ENCOUNTER — Emergency Department: Payer: Medicaid Other

## 2018-11-22 ENCOUNTER — Other Ambulatory Visit: Payer: Self-pay

## 2018-11-22 ENCOUNTER — Encounter: Payer: Self-pay | Admitting: Intensive Care

## 2018-11-22 ENCOUNTER — Emergency Department
Admission: EM | Admit: 2018-11-22 | Discharge: 2018-11-22 | Payer: Medicaid Other | Attending: Emergency Medicine | Admitting: Emergency Medicine

## 2018-11-22 DIAGNOSIS — IMO0002 Reserved for concepts with insufficient information to code with codable children: Secondary | ICD-10-CM

## 2018-11-22 DIAGNOSIS — M545 Low back pain, unspecified: Secondary | ICD-10-CM

## 2018-11-22 DIAGNOSIS — E119 Type 2 diabetes mellitus without complications: Secondary | ICD-10-CM | POA: Insufficient documentation

## 2018-11-22 DIAGNOSIS — A419 Sepsis, unspecified organism: Secondary | ICD-10-CM

## 2018-11-22 DIAGNOSIS — R509 Fever, unspecified: Secondary | ICD-10-CM

## 2018-11-22 DIAGNOSIS — F17210 Nicotine dependence, cigarettes, uncomplicated: Secondary | ICD-10-CM | POA: Insufficient documentation

## 2018-11-22 DIAGNOSIS — L02619 Cutaneous abscess of unspecified foot: Secondary | ICD-10-CM | POA: Insufficient documentation

## 2018-11-22 DIAGNOSIS — Z20828 Contact with and (suspected) exposure to other viral communicable diseases: Secondary | ICD-10-CM | POA: Insufficient documentation

## 2018-11-22 LAB — CBC WITH DIFFERENTIAL/PLATELET
Abs Immature Granulocytes: 0.05 10*3/uL (ref 0.00–0.07)
Basophils Absolute: 0 10*3/uL (ref 0.0–0.1)
Basophils Relative: 0 %
Eosinophils Absolute: 0.1 10*3/uL (ref 0.0–0.5)
Eosinophils Relative: 1 %
HCT: 36.1 % (ref 36.0–46.0)
Hemoglobin: 11.4 g/dL — ABNORMAL LOW (ref 12.0–15.0)
Immature Granulocytes: 0 %
Lymphocytes Relative: 20 %
Lymphs Abs: 2.4 10*3/uL (ref 0.7–4.0)
MCH: 25.7 pg — ABNORMAL LOW (ref 26.0–34.0)
MCHC: 31.6 g/dL (ref 30.0–36.0)
MCV: 81.5 fL (ref 80.0–100.0)
Monocytes Absolute: 0.5 10*3/uL (ref 0.1–1.0)
Monocytes Relative: 5 %
Neutro Abs: 8.9 10*3/uL — ABNORMAL HIGH (ref 1.7–7.7)
Neutrophils Relative %: 74 %
Platelets: 223 10*3/uL (ref 150–400)
RBC: 4.43 MIL/uL (ref 3.87–5.11)
RDW: 14.1 % (ref 11.5–15.5)
WBC: 12 10*3/uL — ABNORMAL HIGH (ref 4.0–10.5)
nRBC: 0 % (ref 0.0–0.2)

## 2018-11-22 LAB — COMPREHENSIVE METABOLIC PANEL
ALT: 12 U/L (ref 0–44)
AST: 21 U/L (ref 15–41)
Albumin: 3.6 g/dL (ref 3.5–5.0)
Alkaline Phosphatase: 79 U/L (ref 38–126)
Anion gap: 10 (ref 5–15)
BUN: 10 mg/dL (ref 6–20)
CO2: 23 mmol/L (ref 22–32)
Calcium: 9.3 mg/dL (ref 8.9–10.3)
Chloride: 103 mmol/L (ref 98–111)
Creatinine, Ser: 0.64 mg/dL (ref 0.44–1.00)
GFR calc Af Amer: >60 mL/min (ref >60)
GFR calc non Af Amer: >60 mL/min (ref >60)
Glucose, Bld: 113 mg/dL — ABNORMAL HIGH (ref 70–99)
Potassium: 3.7 mmol/L (ref 3.5–5.1)
Sodium: 136 mmol/L (ref 135–145)
Total Bilirubin: 0.7 mg/dL (ref 0.3–1.2)
Total Protein: 7.8 g/dL (ref 6.5–8.1)

## 2018-11-22 LAB — C-REACTIVE PROTEIN: CRP: 1.2 mg/dL — ABNORMAL HIGH (ref <1.0)

## 2018-11-22 LAB — SARS CORONAVIRUS 2 BY RT PCR (HOSPITAL ORDER, PERFORMED IN ~~LOC~~ HOSPITAL LAB): SARS Coronavirus 2: NEGATIVE

## 2018-11-22 LAB — SEDIMENTATION RATE: Sed Rate: 56 mm/hr — ABNORMAL HIGH (ref 0–20)

## 2018-11-22 LAB — LACTIC ACID, PLASMA: Lactic Acid, Venous: 1 mmol/L (ref 0.5–1.9)

## 2018-11-22 LAB — HCG, QUANTITATIVE, PREGNANCY: hCG, Beta Chain, Quant, S: <1 m[IU]/mL (ref <5)

## 2018-11-22 IMAGING — CT CT L SPINE W/O CM
3 of 4 series · 10 of 33 positions shown, 12 images · non-contrast
Comparison: Radiographs dated [DATE]

CLINICAL DATA: Severe acute back pain. Recent back surgery at [facility].

EXAM:
CT LUMBAR SPINE WITHOUT CONTRAST
TECHNIQUE: Multidetector CT imaging of the lumbar spine was performed without
intravenous contrast administration. Multiplanar CT image
reconstructions were also generated.

[Series 5: sagittal bone · sagittal · 0.26mm/px · 5 of 68 slices shown, 6 images]
[im 23/68  bone]
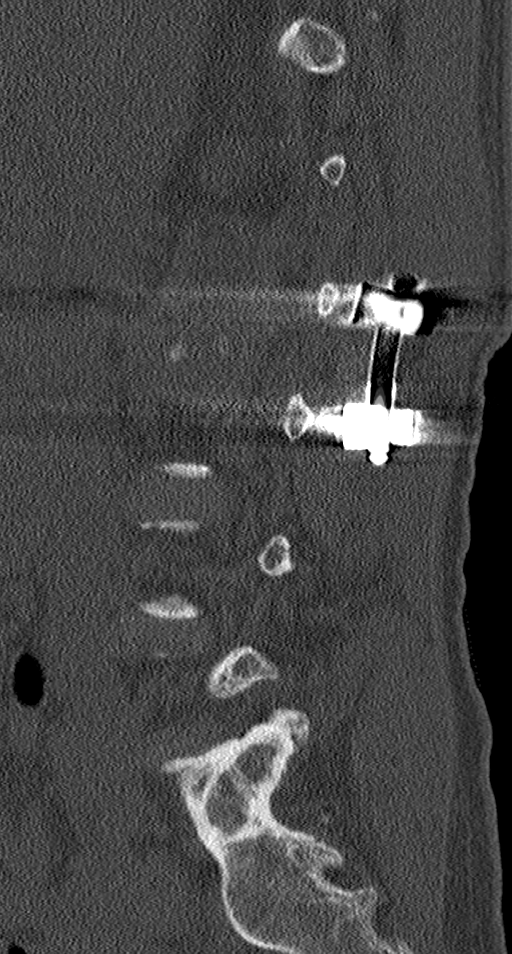
[im 28/68  bone]
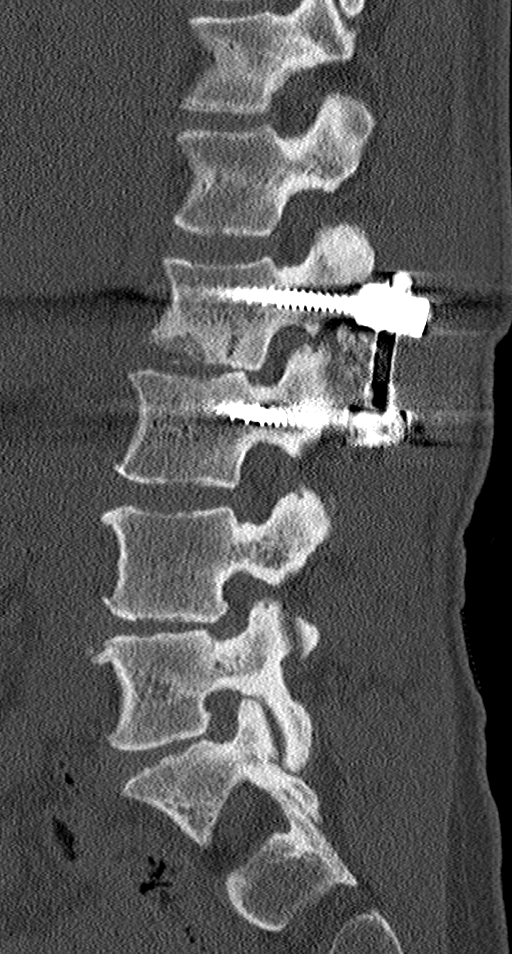
[im 34/68  soft-tissue]
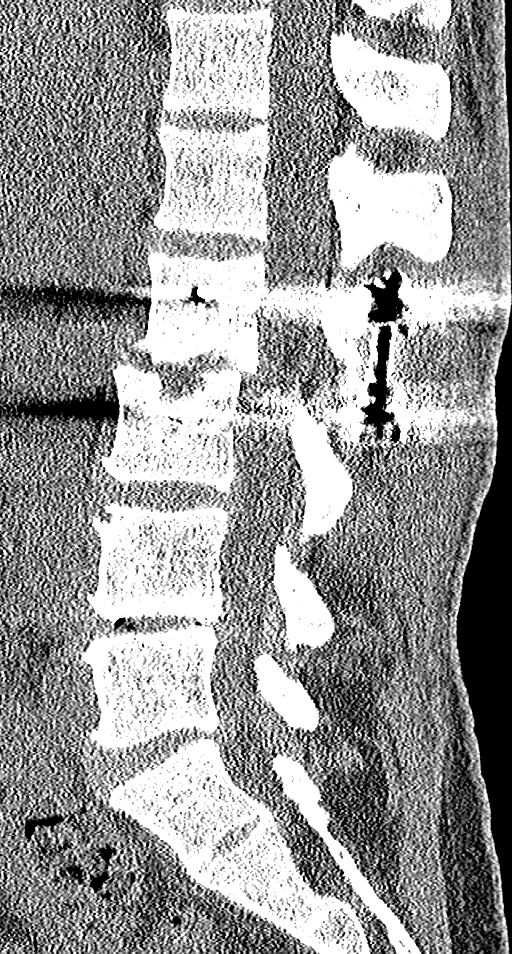
[im 34/68  bone]
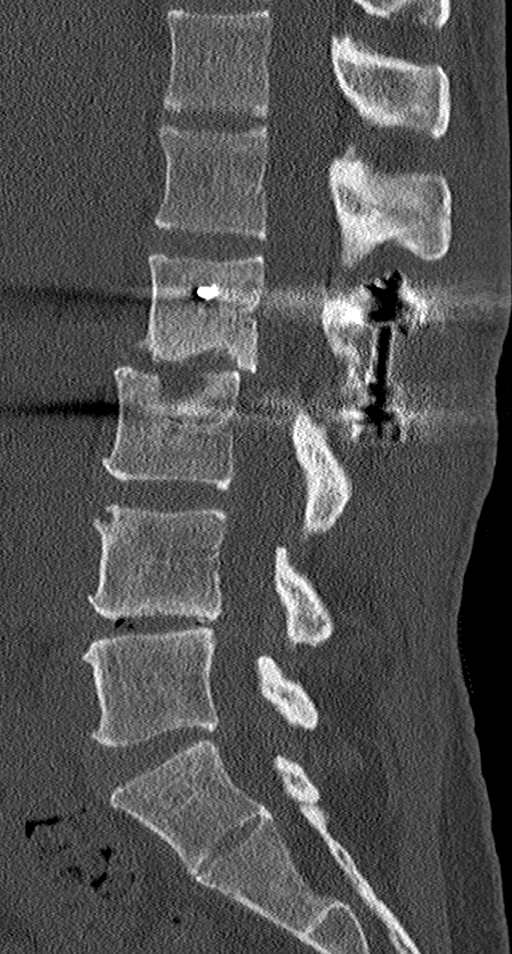
[im 40/68  bone]
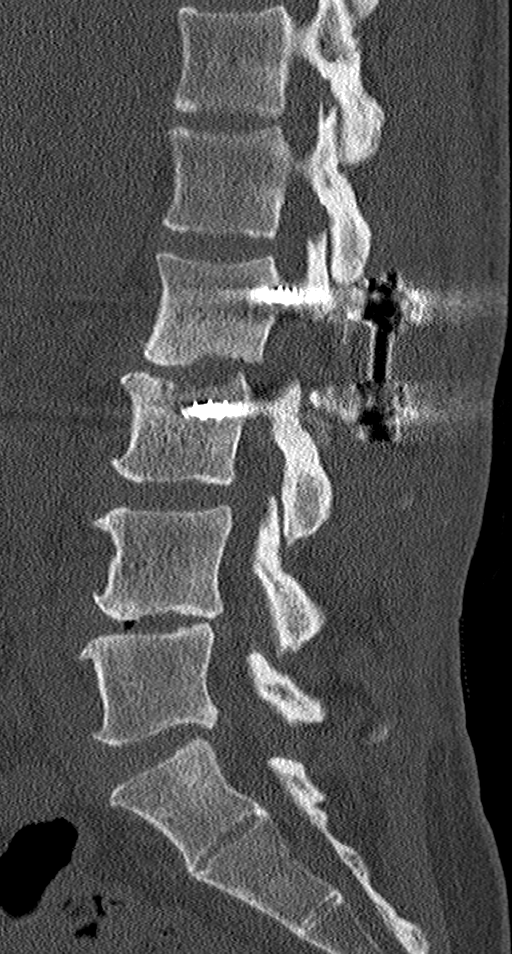
[im 45/68  bone]
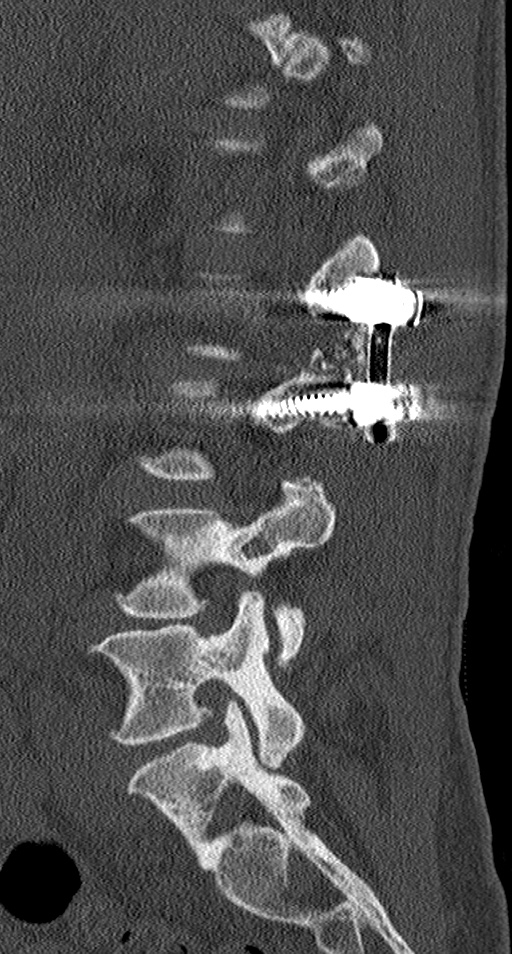

[Series 6: coronal bone · coronal · 0.32mm/px · 3 of 65 slices shown]
[im 13/65  bone]
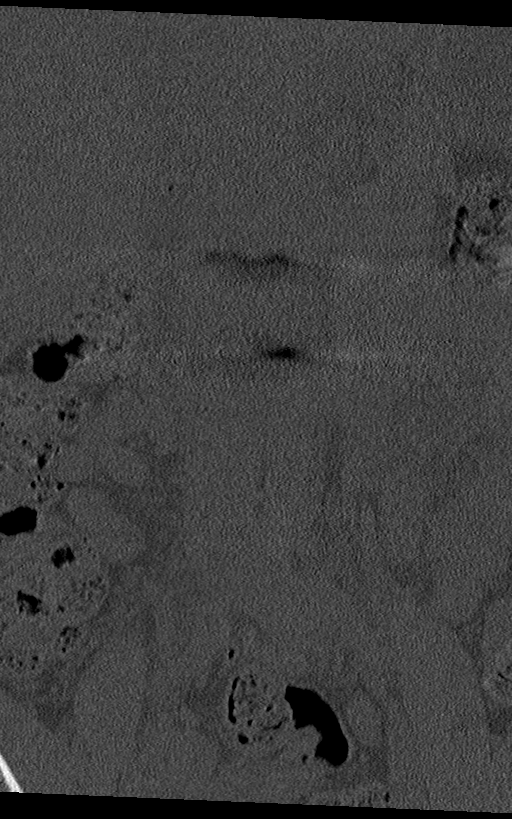
[im 26/65  bone]
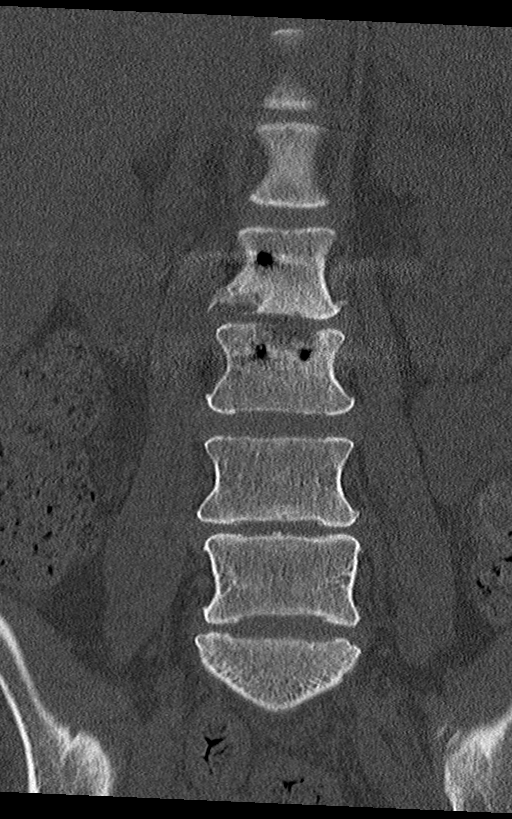
[im 39/65  bone]
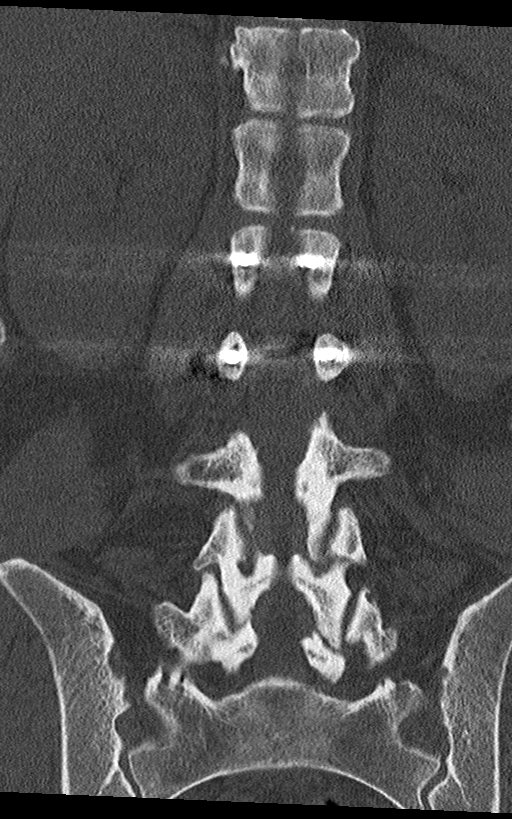

[Series 9: multi disc · axial · 0.21mm/px · z∈[-308,-225]mm · 2 of 117 slices shown, 3 images]
[im 39/117  soft-tissue]
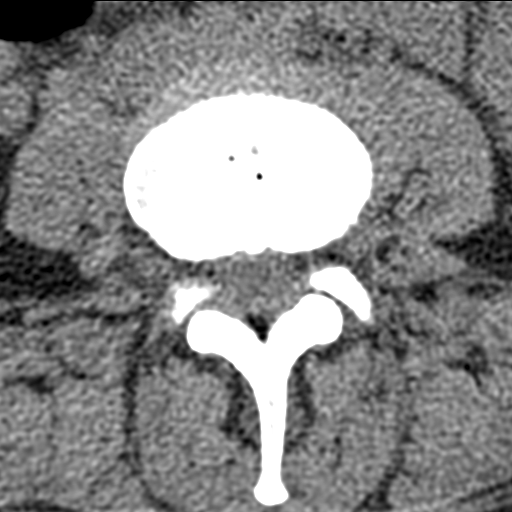
[im 39/117  bone]
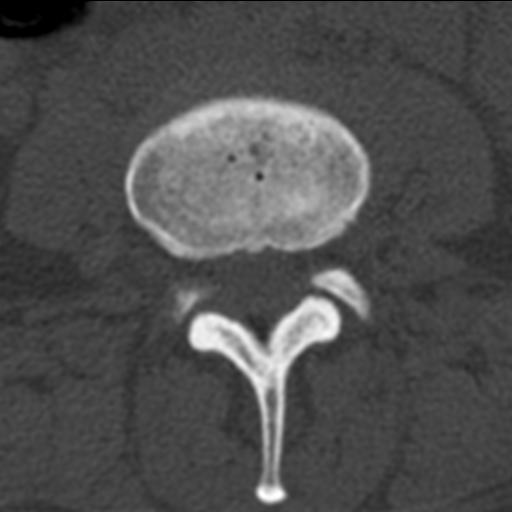
[im 78/117  bone]
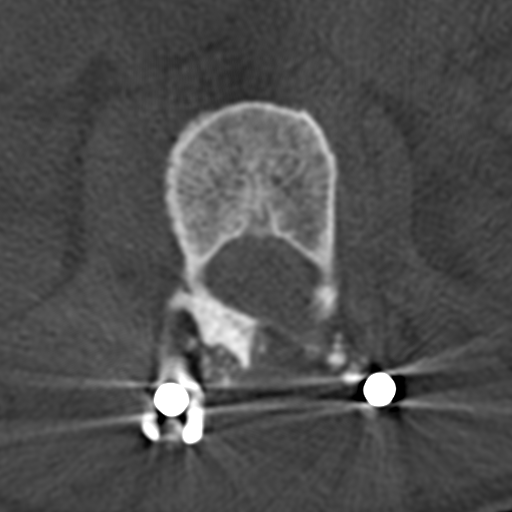

[10 of 33 positions shown; findings below may reference images not displayed]

FINDINGS: Segmentation: 5 lumbar type vertebrae.

Alignment: 3.4 mm retrolisthesis of L2 on L3. Otherwise normal.

Vertebrae: The patient has undergone posterior fusion at L2-3.
Pedicle screws and bone graft material are in place with no evidence
of loosening of the pedicle screws. Left facetectomy and left
laminectomy with resection of the spinous process and a portion of
the right lamina of L2. There is erosion of the vertebral endplates
at L2-3 with irregular sclerosis of the vertebral bodies adjacent to
the endplates.

No other significant bone abnormality in the lumbar spine.

Paraspinal and other soft tissues: Negative.

Disc levels: T11-12: Incompletely visualized. No visible significant
abnormality.

T12-L1: Normal disc. Minimal degenerative changes of the facet
joints.

L1-2: Normal disc. Minimal degenerative changes of the facet joints.

L2-3: Erosions of the endplates at L2-3. No discrete disc
protrusion. Slight retrolisthesis of L2 on L3. The hardware appears
in good position.

L3-4: Small broad-based disc bulge without neural impingement.
Minimal hypertrophy of the left facet joint.

L4-5: Disc space narrowing with disc degeneration with a vacuum
phenomenon. Broad-based small disc protrusion slightly symmetrically
compressing the thecal sac. No significant degenerative changes of
the facet joints. No foraminal stenosis.

L5-S1: Small broad-based disc protrusion extending into both neural
foramina. No discrete neural impingement. Slight symmetrical
compression of the ventral aspect of the thecal sac minimal
degenerative changes of the facet joints.
IMPRESSION: 1. Postsurgical changes at L2-3 as described. No recent prior
studies available for comparison. The changes of the vertebral
endplates at L2-3 could be due to severe degenerative disc disease
or infection.
2. Multilevel degenerative disc and joint disease.
3. Small broad-based disc protrusion at L5-S1 extending into both
neural foramina without discrete neural impingement.

## 2018-11-22 MED ORDER — HYDROMORPHONE HCL 1 MG/ML IJ SOLN
1.0000 mg | INTRAMUSCULAR | Status: AC
  Administered 2018-11-22: 1 mg via INTRAVENOUS

## 2018-11-22 MED ORDER — SODIUM CHLORIDE 0.9 % IV BOLUS
500.0000 mL | Freq: Once | INTRAVENOUS | Status: AC
  Administered 2018-11-22: 500 mL via INTRAVENOUS

## 2018-11-22 MED ORDER — SODIUM CHLORIDE 0.9 % IV BOLUS
1000.0000 mL | Freq: Once | INTRAVENOUS | Status: AC
  Administered 2018-11-22: 1000 mL via INTRAVENOUS

## 2018-11-22 MED ORDER — KETOROLAC TROMETHAMINE 30 MG/ML IJ SOLN
30.0000 mg | Freq: Once | INTRAMUSCULAR | Status: AC
  Administered 2018-11-22: 30 mg via INTRAVENOUS
  Filled 2018-11-22: qty 1

## 2018-11-22 MED ORDER — ONDANSETRON HCL 4 MG/2ML IJ SOLN
4.0000 mg | Freq: Once | INTRAMUSCULAR | Status: AC
  Administered 2018-11-22: 4 mg via INTRAVENOUS
  Filled 2018-11-22: qty 2

## 2018-11-22 MED ORDER — HYDROMORPHONE HCL 1 MG/ML IJ SOLN
1.0000 mg | INTRAMUSCULAR | Status: AC
  Administered 2018-11-22: 1 mg via INTRAVENOUS
  Filled 2018-11-22: qty 1

## 2018-11-22 MED ORDER — ACETAMINOPHEN 10 MG/ML IV SOLN
1000.0000 mg | Freq: Four times a day (QID) | INTRAVENOUS | Status: DC
  Filled 2018-11-22 (×2): qty 100

## 2018-11-22 MED ORDER — VANCOMYCIN HCL 1.5 G IV SOLR
1500.0000 mg | Freq: Once | INTRAVENOUS | Status: AC
  Administered 2018-11-22: 1500 mg via INTRAVENOUS
  Filled 2018-11-22: qty 1500

## 2018-11-22 MED ORDER — SODIUM CHLORIDE 0.9 % IV SOLN
2.0000 g | Freq: Once | INTRAVENOUS | Status: AC
  Administered 2018-11-22: 2 g via INTRAVENOUS
  Filled 2018-11-22: qty 2

## 2018-11-22 MED ORDER — HYDROMORPHONE HCL 1 MG/ML IJ SOLN
INTRAMUSCULAR | Status: AC
  Filled 2018-11-22: qty 1

## 2018-11-22 MED ORDER — FENTANYL CITRATE (PF) 100 MCG/2ML IJ SOLN
75.0000 ug | Freq: Once | INTRAMUSCULAR | Status: AC
  Administered 2018-11-22: 75 ug via INTRAVENOUS
  Filled 2018-11-22: qty 2

## 2018-11-22 MED ORDER — VANCOMYCIN HCL 1.5 G IV SOLR
1500.0000 mg | Freq: Once | INTRAVENOUS | Status: DC
  Filled 2018-11-22: qty 1500

## 2018-11-22 NOTE — Progress Notes (Signed)
PHARMACY -  BRIEF ANTIBIOTIC NOTE   Pharmacy has received consult(s) for Vancomycin from an ED provider.  The patient's profile has been reviewed for ht/wt/allergies/indication/available labs.    One time order(s) placed for Vancomycin 1500mg  IV  Further antibiotics/pharmacy consults should be ordered by admitting physician if indicated.                       Thank you, Vira Blanco 11/22/2018  11:48 AM

## 2018-11-22 NOTE — ED Notes (Signed)
EMTALA reviewed. 

## 2018-11-22 NOTE — ED Notes (Signed)
Pt continues to scream in pain; 2nd nurse tried for IV; lab personnel in room to draw 2nd culture.

## 2018-11-22 NOTE — Consult Note (Signed)
Neurosurgery-New Consultation Evaluation 11/22/2018 Susan Panningoel Mole [id number]  Identifying Statement: Susan Terry is a 43 y.o. [place] [id number] with severe back pain  Physician Requesting Consultation:  [facility]  History of Present Illness: [name] is here with severe back pain that started this morning. She has an [facility] with recent discitis and osteomyelitis with recent L2/3 fusion on 8/20. She has been treated with antibiotics but appears to have completed this. She is screaming in pain in the room and does not endorse any new events. She denies any leg pain or weakness. There is a concern for sepsis given vital signs and history.   Past Medical History:  History reviewed. No pertinent past medical history.  Social History: Social History   Socioeconomic History  . Marital status: Single    Spouse name: Not on file  . Number of children: Not on file  . Years of education: Not on file  . Highest education level: Not on file  Occupational History  . Not on file  Social Needs  . Financial resource strain: Not on file  . Food insecurity    Worry: Not on file    Inability: Not on file  . Transportation needs    Medical: Not on file    Non-medical: Not on file  Tobacco Use  . Smoking status: Current Every Day Smoker    Packs/day: 0.50    Types: Cigarettes  . Smokeless tobacco: Never Used  Substance and Sexual Activity  . Alcohol use: No  . Drug use: Yes    Types: Methamphetamines, IV  . Sexual activity: Never  Lifestyle  . Physical activity    Days per week: Not on file    Minutes per session: Not on file  . Stress: Not on file  Relationships  . Social Musicianconnections    Talks on phone: Not on file    Gets together: Not on file    Attends religious service: Not on file    Active member of club or organization: Not on file    Attends meetings of clubs or organizations: Not on file    Relationship status: Not on file  .  Intimate partner violence    Fear of current or ex partner: Not on file    Emotionally abused: Not on file    Physically abused: Not on file    Forced sexual activity: Not on file  Other Topics Concern  . Not on file  Social History Narrative  . Not on file   Family History: Family History  Problem Relation Age of Onset  . Cancer Mother        cancer throughout family    Review of Systems:  Review of Systems - General ROS: Negative Psychological ROS: Negative Ophthalmic ROS: Negative ENT ROS: Negative Hematological and Lymphatic ROS: Negative  Endocrine ROS: Negative Respiratory ROS: Negative Cardiovascular ROS: Negative Gastrointestinal ROS: Negative Genito-Urinary ROS: Negative Musculoskeletal ROS: Positive for back pain Neurological ROS: Negative for leg pain Dermatological ROS: Negative  Physical Exam: BP 126/72   Pulse 85   Temp 98.3 F (36.8 C)   Resp 18   Ht 5\' 7"  (1.702 m)   Wt 63.5 kg   SpO2 96%   BMI 21.93 kg/m  Body mass index is 21.93 kg/m. Body surface area is 1.73 meters squared. General appearance: Alert, screaming supine in bed Back: Midline incision is well healed.  Ext: Warm extremities, skin lesions noted  Neurologic exam:  Mental  status: alertness: alert, affect: tearful Speech: fluent and clear Motor:strength symmetric 5/5 in bilateral hip flexion, knee flexion, knee extension, dorsiflexion and plantarflexion Sensory: difficult to asess given pain Reflexes: Unable to test as patient wont cooperate Gait: not tested  Laboratory: Results for orders placed or performed during the hospital encounter of 11/22/18  SARS Coronavirus 2 by RT PCR (hospital order, performed in [place] hospital lab) Nasopharyngeal Nasopharyngeal Swab   Specimen: Nasopharyngeal Swab  Result Value Ref Range   SARS Coronavirus 2 NEGATIVE NEGATIVE  CBC with Differential  Result Value Ref Range   WBC 12.0 (H) 4.0 - 10.5 K/uL   RBC 4.43 3.87 - 5.11 MIL/uL    Hemoglobin 11.4 (L) 12.0 - 15.0 g/dL   HCT 36.1 36.0 - 46.0 %   MCV 81.5 80.0 - 100.0 fL   MCH 25.7 (L) 26.0 - 34.0 pg   MCHC 31.6 30.0 - 36.0 g/dL   RDW 14.1 11.5 - 15.5 %   Platelets 223 150 - 400 K/uL   nRBC 0.0 0.0 - 0.2 %   Neutrophils Relative % 74 %   Neutro Abs 8.9 (H) 1.7 - 7.7 K/uL   Lymphocytes Relative 20 %   Lymphs Abs 2.4 0.7 - 4.0 K/uL   Monocytes Relative 5 %   Monocytes Absolute 0.5 0.1 - 1.0 K/uL   Eosinophils Relative 1 %   Eosinophils Absolute 0.1 0.0 - 0.5 K/uL   Basophils Relative 0 %   Basophils Absolute 0.0 0.0 - 0.1 K/uL   Immature Granulocytes 0 %   Abs Immature Granulocytes 0.05 0.00 - 0.07 K/uL  Sedimentation rate  Result Value Ref Range   Sed Rate 56 (H) 0 - 20 mm/hr  C-reactive protein  Result Value Ref Range   CRP 1.2 (H) <1.0 mg/dL  Comprehensive metabolic panel  Result Value Ref Range   Sodium 136 135 - 145 mmol/L   Potassium 3.7 3.5 - 5.1 mmol/L   Chloride 103 98 - 111 mmol/L   CO2 23 22 - 32 mmol/L   Glucose, Bld 113 (H) 70 - 99 mg/dL   BUN 10 6 - 20 mg/dL   Creatinine, Ser 0.64 0.44 - 1.00 mg/dL   Calcium 9.3 8.9 - 10.3 mg/dL   Total Protein 7.8 6.5 - 8.1 g/dL   Albumin 3.6 3.5 - 5.0 g/dL   AST 21 15 - 41 U/L   ALT 12 0 - 44 U/L   Alkaline Phosphatase 79 38 - 126 U/L   Total Bilirubin 0.7 0.3 - 1.2 mg/dL   GFR calc non Af Amer >60 >60 mL/min   GFR calc Af Amer >60 >60 mL/min   Anion gap 10 5 - 15  hCG, quantitative, pregnancy  Result Value Ref Range   hCG, Beta Chain, Quant, S <1 <5 mIU/mL  Lactic acid, plasma  Result Value Ref Range   Lactic Acid, Venous 1.0 0.5 - 1.9 mmol/L   I personally reviewed labs  Imaging: CT Lumbar Spine: 1. Postsurgical changes at L2-3 as described. No recent prior studies available for comparison. The changes of the vertebral endplates at S2-8 could be due to severe degenerative disc disease or infection. 2. Multilevel degenerative disc and joint disease. 3. Small broad-based disc protrusion at  L5-S1 extending into both neural foramina without discrete neural impingement.  Impression/Plan:  [name] is here with severe back pain of unknown origin. We have ruled out a new fracture or hardware malfunction. She will likely need a MRI but agree with  starting antibiotics and she will need transfer to [facility] for her surgeons to evaluate given fusion and prior infection 2 months ago. Agree with aggressive pain management and given normal neurological exam, no urgent intervention needed.    1.  Diagnosis: Severe back pain  2.  Plan - Recommend transfer to treating facility

## 2018-11-22 NOTE — ED Notes (Signed)
Attempted 2nd iv x 2; not successful

## 2018-11-22 NOTE — ED Notes (Signed)
[facility]SF[facility]  CALLED  PER  DR  QUALE  MD 

## 2018-11-22 NOTE — ED Notes (Signed)
Pt placed on 2L O2 when she stopped screaming long enough to sleep a bit and her saturation dipped into upper 70's. When pt is awake, she screams and does not wear O2 and saturation is upper 90's.

## 2018-11-22 NOTE — ED Provider Notes (Signed)
[facility] Emergency Department Provider Note   ____________________________________________   First MD Initiated Contact with Patient 11/22/18 1114     (approximate)  I have reviewed the triage vital signs and the nursing notes.   HISTORY  Chief Complaint Back Pain  EM caveat: Severe pain, intractable pain, writhing, patient unable to focus full review of system due to pain  HPI Susan Terry is a 43 y.o. female previous history L2-3 for osteomyelitis/discitis And polysubstance abuse.  Patient reports that she began feeling ill yesterday, having some back pain but then woke up this morning with severe pain in her lower back.  She reports it is in the same or very similar area to where she had a bad infection in her back before.  She reports to me that she has been feeling feverish for a day.  No known Covid exposure.  She was hospitalized at [facility] sometime in the last year for severe back pain and found to have infection in her spine that required surgery  Denies pregnancy.  No abdominal pain, pain is all located in her back mid back and severe and very painful.  Denies acute muscle weakness  No chest pain or shortness of breath.  No cough.  Denies loss of bowel or bladder function.  With some inquisition as she initially denied it, but after discussing with her some exam findings on skin exam she does admit to IV drug abuse that remains active   History reviewed. No pertinent past medical history.  Patient Active Problem List   Diagnosis Date Noted   Abscess of right forearm    Cellulitis 04/06/2017    Past Surgical History:  Procedure Laterality Date   BACK SURGERY     INCISION AND DRAINAGE FOREARM / WRIST DEEP Right     Prior to Admission medications   Medication Sig Start Date End Date Taking? Authorizing Provider  Currently not taking any     Hinda Kehr, MD       [place] Lighter, MD       Hinda Kehr, MD   Hinda Kehr, MD       [place] Lighter, MD       Sable Feil, PA-C       Sable Feil, PA-C    Allergies Patient has no known allergies.  Family History  Problem Relation Age of Onset   Cancer Mother        cancer throughout family    Social History Social History   Tobacco Use   Smoking status: Current Every Day Smoker    Packs/day: 0.50    Types: Cigarettes   Smokeless tobacco: Never Used  Substance Use Topics   Alcohol use: No   Drug use: Yes    Types: Methamphetamines, IV    Review of Systems Constitutional: Felt hot like she is having a fever  eyes: No visual changes. ENT: No sore throat. Cardiovascular: Denies chest pain. Respiratory: Denies shortness of breath. Gastrointestinal: No abdominal pain.   Genitourinary: Negative for dysuria.  Denies pregnancy or vaginal bleeding. Musculoskeletal: No pain in her neck or upper back but reports severe pain in her middle lower back 10 out of 10 like no pain she can ever describe before Skin: She noticed a spot on her right foot that seemed a little red and warm a day ago like her foot is been rubbing on it Neurological: Negative for headaches, areas of focal weakness or numbness.  Does however describe severe  back pain.    ____________________________________________   PHYSICAL EXAM:  VITAL SIGNS: ED Triage Vitals  Enc Vitals Group     BP 11/22/18 1048 115/60     Pulse Rate 11/22/18 1048 99     Resp 11/22/18 1048 16     Temp 11/22/18 1052 98 F (36.7 C)     Temp Source 11/22/18 1052 Axillary     SpO2 11/22/18 1048 100 %     Weight 11/22/18 1049 140 lb (63.5 kg)     Height 11/22/18 1049 5' 7"  (1.702 m)     Head Circumference --      Peak Flow --      Pain Score --      Pain Loc --      Pain Edu? --      Excl. in [place]? --     Constitutional: Alert and oriented.  Patient is almost histrionic and the level of pain she is in, she appears in some very severe pain in her lower back. Eyes:  Conjunctivae are normal. Head: Atraumatic. Nose: No congestion/rhinnorhea. Mouth/Throat: Mucous membranes are moist. Neck: No stridor.  Cardiovascular: Normal rate, regular rhythm. Grossly normal heart sounds.  Good peripheral circulation. Respiratory: Normal respiratory effort.  No retractions. Lungs CTAB. Gastrointestinal: Soft and nontender. No distention. Musculoskeletal: Normal use of upper extremities bilaterally.  Severe limitation due to severe pain in her lower back with any attempt to move.  She does have intact motor and neurologic function as well as normal distal pulses including dorsalis pedis and posterior tibial with normal capillary refill in the feet.  However, exam very limited due to severity of pain any movement causes severe pain or lower back Neurologic:  Normal speech and language. No gross focal neurologic deficits are appreciated.  Skin:  Skin is warm, dry and intact. No rash noted except there is multiple areas that appear to be track marks as well as one area slightly erythematous and just very minimally indurated over the right foot dorsal surface with redness and warmth and some tenderness concerning for possible skin abscess or localized cellulitis top of the foot. Psychiatric: Mood and affect are normal. Speech and behavior are normal.  ____________________________________________   LABS (all labs ordered are listed, but only abnormal results are displayed)  Labs Reviewed  CBC WITH DIFFERENTIAL/PLATELET - Abnormal; Notable for the following components:      Result Value   WBC 12.0 (*)    Hemoglobin 11.4 (*)    MCH 25.7 (*)    Neutro Abs 8.9 (*)    All other components within normal limits  SEDIMENTATION RATE - Abnormal; Notable for the following components:   Sed Rate 56 (*)    All other components within normal limits  C-REACTIVE PROTEIN - Abnormal; Notable for the following components:   CRP 1.2 (*)    All other components within normal limits    COMPREHENSIVE METABOLIC PANEL - Abnormal; Notable for the following components:   Glucose, Bld 113 (*)    All other components within normal limits  SARS CORONAVIRUS 2 BY RT PCR (HOSPITAL ORDER, [facility])  CULTURE, BLOOD (ROUTINE X 2)  CULTURE, BLOOD (ROUTINE X 2)  CULTURE, BLOOD (ROUTINE X 2)  HCG, QUANTITATIVE, PREGNANCY  LACTIC ACID, PLASMA  URINALYSIS, COMPLETE (UACMP) WITH MICROSCOPIC  LACTIC ACID, PLASMA   ____________________________________________  EKG   ____________________________________________  RADIOLOGY  Ct Lumbar Spine Wo Contrast  Result Date: 11/22/2018 CLINICAL DATA:  Severe acute back  pain. Recent back surgery at [facility]. EXAM: CT LUMBAR SPINE WITHOUT CONTRAST TECHNIQUE: Multidetector CT imaging of the lumbar spine was performed without intravenous contrast administration. Multiplanar CT image reconstructions were also generated. COMPARISON:  Radiographs dated 06/29/2017 FINDINGS: Segmentation: 5 lumbar type vertebrae. Alignment: 3.4 mm retrolisthesis of L2 on L3. Otherwise normal. Vertebrae: The patient has undergone posterior fusion at L2-3. Pedicle screws and bone graft material are in place with no evidence of loosening of the pedicle screws. Left facetectomy and left laminectomy with resection of the spinous process and a portion of the right lamina of L2. There is erosion of the vertebral endplates at Z3-6 with irregular sclerosis of the vertebral bodies adjacent to the endplates. No other significant bone abnormality in the lumbar spine. Paraspinal and other soft tissues: Negative. Disc levels: T11-12: Incompletely visualized. No visible significant abnormality. T12-L1: Normal disc. Minimal degenerative changes of the facet joints. L1-2: Normal disc. Minimal degenerative changes of the facet joints. L2-3: Erosions of the endplates at U4-4. No discrete disc protrusion. Slight retrolisthesis of L2 on L3. The hardware appears in good  position. L3-4: Small broad-based disc bulge without neural impingement. Minimal hypertrophy of the left facet joint. L4-5: Disc space narrowing with disc degeneration with a vacuum phenomenon. Broad-based small disc protrusion slightly symmetrically compressing the thecal sac. No significant degenerative changes of the facet joints. No foraminal stenosis. L5-S1: Small broad-based disc protrusion extending into both neural foramina. No discrete neural impingement. Slight symmetrical compression of the ventral aspect of the thecal sac minimal degenerative changes of the facet joints. IMPRESSION: 1. Postsurgical changes at L2-3 as described. No recent prior studies available for comparison. The changes of the vertebral endplates at I3-4 could be due to severe degenerative disc disease or infection. 2. Multilevel degenerative disc and joint disease. 3. Small broad-based disc protrusion at L5-S1 extending into both neural foramina without discrete neural impingement. Electronically Signed   By: Lorriane Shire M.D.   On: 11/22/2018 14:52    CT imaging reviewed as above. ____________________________________________   PROCEDURES  Procedure(s) performed: None  Procedures  Critical Care performed: Yes, see critical care note(s)  CRITICAL CARE Performed by: Delman Kitten   Total critical care time: 65 minutes  Critical care time was exclusive of separately billable procedures and treating other patients.  Critical care was necessary to treat or prevent imminent or life-threatening deterioration.  Critical care was time spent personally by me on the following activities: development of treatment plan with patient and/or surrogate as well as nursing, discussions with consultants, evaluation of patient's response to treatment, examination of patient, obtaining history from patient or surrogate, ordering and performing treatments and interventions, ordering and review of laboratory studies, ordering and  review of radiographic studies, pulse oximetry and re-evaluation of patient's condition.  ____________________________________________   INITIAL IMPRESSION / ASSESSMENT AND PLAN / ED COURSE  Pertinent labs & imaging results that were available during my care of the patient were reviewed by me and considered in my medical decision making (see chart for details).   Patient presenting for severe increasing pain in her low back near or at her previous lumbar spine final site.  She denies acute pulmonary symptoms.  No acute neurologic symptoms.  Denies urinary symptoms.  She does have an area of redness possible cellulitis or small developing abscess over the top of the right foot but this does not necessarily explain the severity of her low back pain.  I am concerned certainly there is infection, high risk for  infection associated with her IV drug abuse including endocarditis, osteomyelitis, spinal epidural abscess, other abscess, cellulitis etc.  She shows evidence of sepsis  Clinical Course as of Nov 21 1505  Mon Nov 22, 2018  1120 Patient noted to be febrile.  With her severe back pain now combined with fever I am highly concerned about recurrent infection, including osteomyelitis.  I have sent request for consultation to Dr. Lysle Morales of neurosurgery for early treatment recommendations   [MQ]  1202 Abx held at recommendation of ID, Dr. Delaine Lame   [MQ]  1216 Case discussed with Dr. Lacinda Axon of neurosurgery, he will be coming to see and evaluate the patient in the ER for further recommendations regarding her back pain.  At this time is agreeable with vancomycin and cefepime coverage, also agreeable with obtaining MRI.   [MQ]  1221 Continues to writhe in pain.  He is noted to be hypotensive, receiving fluid bolus.  Awaiting sepsis consultations by infectious disease and neurosurgery as well as MRI.  She is awake and fully oriented reporting the worst pain in her life in her low back.  Continuing  fluids, working to establish additional IV access.  I have ordered IV fentanyl, consideration for Toradol but chemistry and creatinine have not returned   [MQ]  1317 Transfer requested to [facility] at this time. Dr. Lacinda Axon advises transfer to [facility] for neurosurgical consultation with her specialist and prior surgical team. Agrees with Abx and req   [MQ]  1318 Patient occasionally having ongoing episodes of severe pain, but is appearing more comfortable now.   [MQ]  1337 Patient alerts, she is now resting.  She appears that her pain is better and she reports she is finally starting to get some rest.  She is awaiting CT scan.  Because of the concern for sepsis with significant back pain fever and elevated ESR she has now been accepted in transfer to [facility] ER to ER by Dr. Evlyn Courier.  Patient is understanding of this plan and in agreement with transfer to [facility] for further care with team familiar with her including infectious disease and neurosurgery   [MQ]  1412 Patient resting comfortably.  Stable.  Pain controlled.  Awaiting transfer to [facility], CT scan has been delayed as we are down to 1 CT scanner at the present time.  Discussed with CT and they will be performing her study soon and power sharing to [facility]   [MQ]    Clinical Course User Index [MQ] Delman Kitten, MD  Patient pain and inability to lay still despite multiple pain medications to great to reasonably perform MRI at this time  ----------------------------------------- 1:43 PM on 11/22/2018 -----------------------------------------  Patient has responded well to pain control now, blood pressure is improved, she is resting more comfortably agreeable with plan to transfer.  Awaiting CT imaging, transferring to [facility] ER to ER for further work-up and care and anticipated consultation with her care teams.  Appreciate consultation by Dr. Lacinda Axon of neurosurgery here as well as input from Dr. Delaine Lame infectious  disease antibiotics and treatment guided as she also recommended  ----------------------------------------- 3:06 PM on 11/22/2018 -----------------------------------------  Ongoing care in the emergency room assigned to Dr. Rip Harbour.  Continue to treat the patient for sepsis, patient accepted to [facility] ER, is currently pending transport there for ongoing care and further evaluation as to cause of fever and back pain though I am very concerned about a potential spinal etiology ____________________________________________  FINAL CLINICAL IMPRESSION(S) / ED DIAGNOSES  Final diagnoses:  Acute midline low back pain without sciatica  Fever, unspecified fever cause  Sepsis, due to unspecified organism, unspecified whether acute organ dysfunction present ([place])  Abscess or cellulitis of foot        Note:  This document was prepared using Dragon voice recognition software and may include unintentional dictation errors       Delman Kitten, MD 11/22/18 [phone]

## 2018-11-22 NOTE — Progress Notes (Signed)
Notified bedside nurse of need to administer antibiotics.  

## 2018-11-22 NOTE — ED Notes (Signed)
Iv team nurse at bedside  

## 2018-11-22 NOTE — ED Triage Notes (Signed)
Left back pain . It was there when she woke.  No injury.  IV left arm 20 g.  fsbs 122, afebrile,

## 2018-11-22 NOTE — Progress Notes (Signed)
Lactate not resulted yet, asked bedside RN to check on it

## 2018-11-22 NOTE — ED Notes (Signed)
Patient c/o back pain when she awoke this AM. Arrived by EMS. Patient screaming out in triage/refusing to wear mask/begging for ice chips. Patient has multiple injection sites all over body but reports she no longer injects drugs. Reports recent back surgery at [facility].

## 2018-11-22 NOTE — Progress Notes (Signed)
PHARMACY -  BRIEF ANTIBIOTIC NOTE   Pharmacy has received consult(s) for Vancomycin and Cefepime from an ED provider.  The patient's profile has been reviewed for ht/wt/allergies/indication/available labs.    One time order(s) placed for Vancomycin 1500mg  IV x 1 and Cefepime 2g IV x 1.  Further antibiotics/pharmacy consults should be ordered by admitting physician if indicated.                       Thank you, Pearla Dubonnet 11/22/2018  2:40 PM

## 2018-11-22 NOTE — ED Notes (Signed)
Report given to jason of [employer]; he will call back with ETA to pick pt up.

## 2018-11-23 LAB — BLOOD CULTURE ID PANEL (REFLEXED)

## 2018-11-23 MED ORDER — MEDI-TUSSIN DM DOUBLE STRENGTH 30-200 MG/5ML PO LIQD
1000.00 | ORAL | Status: DC
Start: ? — End: 2018-11-23

## 2018-11-23 MED ORDER — DOLACET PO
0.10 | ORAL | Status: DC
Start: ? — End: 2018-11-23

## 2018-11-23 MED ORDER — ISOVUE-M 300 61 % IJ SOLN
4.00 | INTRAMUSCULAR | Status: DC
Start: ? — End: 2018-11-23

## 2018-11-23 MED ORDER — PEDIASURE 1.0 CAL/FIBER PO LIQD
2.00 | ORAL | Status: DC
Start: ? — End: 2018-11-23

## 2018-11-23 MED ORDER — CVS EAR DROPS OT
40.00 | OTIC | Status: DC
Start: 2018-11-24 — End: 2018-11-23

## 2018-11-23 MED ORDER — PEDIASURE 1.0 CAL/FIBER PO LIQD
4.00 | ORAL | Status: DC
Start: ? — End: 2018-11-23

## 2018-11-23 MED ORDER — Medication
1000.00 | Status: DC
Start: 2018-11-23 — End: 2018-11-23

## 2018-11-23 MED ORDER — FUTURO SOFT CERVICAL COLLAR MISC
600.00 | Status: DC
Start: ? — End: 2018-11-23

## 2018-11-23 MED ORDER — EAR PAIN RELIEF HOMEOPATHIC OT
20.00 | OTIC | Status: DC
Start: ? — End: 2018-11-23

## 2018-11-23 NOTE — ED Notes (Signed)
Entry:  11/23/18 0403. Lab called with positive blood culture result. Results faxed over to [facility] ED where pt is currently and called Anderson Malta at [facility] to make sure she is aware of faxed results.

## 2018-11-23 NOTE — Progress Notes (Signed)
BCID positive for MRSA- called [facility] ID dept and spoke toTtanara CMA to Dr.M who is her ID provider about the positive blood culture

## 2018-11-25 LAB — CULTURE, BLOOD (ROUTINE X 2)
Special Requests: ADEQUATE
Special Requests: ADEQUATE

## 2018-11-26 NOTE — Progress Notes (Signed)
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 Patient was subsequently transferred to [facility] for further care. BCID from ED visit positive for MRSA and [facility] was notified of result. Blood cultures (3 sets) from ED visit have resulted 4/6 MRSA. Per chart everywhere patient is admitted at [facility]. Spoke with in-patient pharmacist there and faxed blood culture result to them.   [place] Resident 26 November 2018

## 2019-10-28 ENCOUNTER — Emergency Department: Payer: HRSA Program

## 2019-10-28 ENCOUNTER — Emergency Department
Admission: EM | Admit: 2019-10-28 | Discharge: 2019-10-28 | Disposition: A | Discharge status: Home / Self Care | Payer: HRSA Program | Attending: Emergency Medicine | Admitting: Emergency Medicine

## 2019-10-28 ENCOUNTER — Other Ambulatory Visit: Payer: Self-pay

## 2019-10-28 DIAGNOSIS — U071 COVID-19: Secondary | ICD-10-CM | POA: Diagnosis not present

## 2019-10-28 DIAGNOSIS — R059 Cough, unspecified: Secondary | ICD-10-CM | POA: Diagnosis present

## 2019-10-28 DIAGNOSIS — F17210 Nicotine dependence, cigarettes, uncomplicated: Secondary | ICD-10-CM | POA: Diagnosis not present

## 2019-10-28 LAB — RESPIRATORY PANEL BY RT PCR (FLU A&B, COVID)
Influenza A by PCR: NEGATIVE
Influenza B by PCR: NEGATIVE
SARS Coronavirus 2 by RT PCR: POSITIVE — AB

## 2019-10-28 IMAGING — DX DG CHEST 1V PORT
1 series · 1 of 1 positions shown · non-contrast
Comparison: None.

CLINICAL DATA: Cough, fever, weakness

EXAM:
PORTABLE CHEST 1 VIEW

[chest ap]
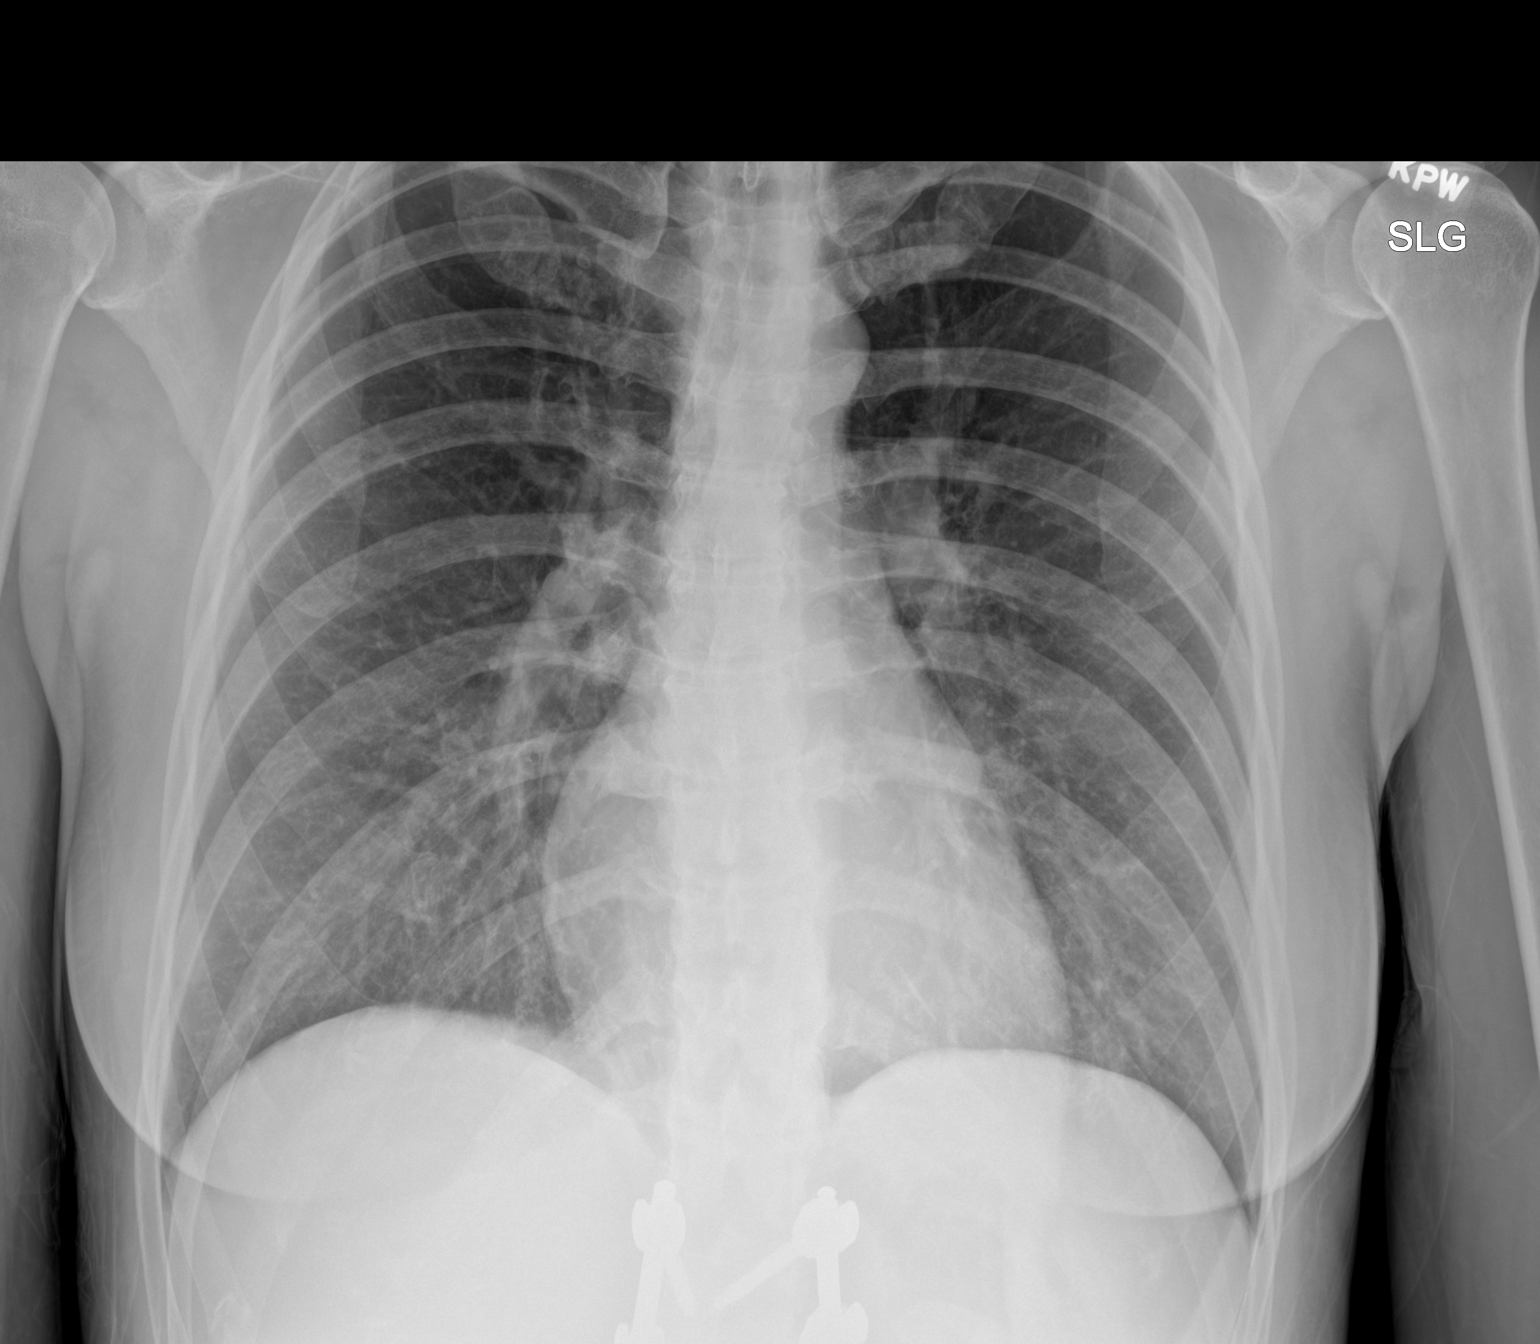

[1 of 1 positions shown; findings below may reference images not displayed]

FINDINGS: The heart size and mediastinal contours are within normal limits. No
focal airspace consolidation, pleural effusion, or pneumothorax. The
visualized skeletal structures are unremarkable.
IMPRESSION: No active disease.

## 2019-10-28 MED ORDER — AZITHROMYCIN 250 MG PO TABS: ORAL_TABLET | ORAL | 0 refills | Status: DC

## 2019-10-28 MED ORDER — PREDNISONE 10 MG (21) PO TBPK: ORAL_TABLET | ORAL | 0 refills | Status: DC

## 2019-10-28 NOTE — ED Notes (Signed)
See triage note- pt has had generalized body aches, fatigue, and a productive cough x 2 days. Pt reports fever of 103.5 yesterday.

## 2019-10-28 NOTE — Discharge Instructions (Signed)
Follow-up with your regular doctor if not improving in 5 to 7 days.  Return to the emergency department if worsening.  Take the Zithromax and Sterapred to decrease some of the inflammation in your lungs.  Tylenol for fever and body aches as needed.  Drink plenty of fluids.  You may also want to take over-the-counter zinc and vitamin C.

## 2019-10-28 NOTE — ED Triage Notes (Signed)
Pt arrived to ED via POV. Pt c/o cough, fever, SOB that started a few days ago.   Pt states "I feel like hell"

## 2019-10-28 NOTE — ED Provider Notes (Signed)
[facility] Emergency Department Provider Note  ____________________________________________   First MD Initiated Contact with Patient 10/28/19 1551     (approximate)  I have reviewed the triage vital signs and the nursing notes.   HISTORY  Chief Complaint Fever    HPI Susan Terry is a 44 y.o. female presents emergency department with complaints of Covid-like symptoms.  Patient's had a cough, fever and shortness of breath for the last few days.  Patient is not vaccinated for Covid.  She denies chest pain or shortness of breath at this time.  However she states that she aches all over and feels like she has the flu.  No vomiting or diarrhea.  No UTI symptoms.    History reviewed. No pertinent past medical history.  Patient Active Problem List   Diagnosis Date Noted  . Abscess of right forearm   . Cellulitis 04/06/2017    Past Surgical History:  Procedure Laterality Date  . BACK SURGERY    . INCISION AND DRAINAGE FOREARM / WRIST DEEP Right     Prior to Admission medications   Medication Sig Start Date End Date Taking? Authorizing Provider  azithromycin (ZITHROMAX Z-PAK) 250 MG tablet 2 pills today then 1 pill a day for 4 days 10/28/19   Sherrie Mustache Roselyn Bering, PA-C  elvitegravir-cobicistat-emtricitabine-tenofovir (GENVOYA) 150-150-200-10 MG TABS tablet Take 1 tablet by mouth daily with breakfast. 05/31/17   Loleta Rose, MD  elvitegravir-cobicistat-emtricitabine-tenofovir (GENVOYA) 150-150-200-10 MG TABS tablet Take 1 tablet by mouth daily with breakfast. 05/31/17   Loleta Rose, MD  predniSONE (STERAPRED UNI-PAK 21 TAB) 10 MG (21) TBPK tablet Take 6 pills on day one then decrease by 1 pill each day 10/28/19   Faythe Ghee, PA-C    Allergies Patient has no known allergies.  Family History  Problem Relation Age of Onset  . Cancer Mother        cancer throughout family    Social History Social History   Tobacco Use  . Smoking status: Current  Every Day Smoker    Packs/day: 0.50    Types: Cigarettes  . Smokeless tobacco: Never Used  Vaping Use  . Vaping Use: Former  Substance Use Topics  . Alcohol use: No  . Drug use: Not Currently    Types: Methamphetamines, IV    Review of Systems  Constitutional: Positive fever/chills Eyes: No visual changes. ENT: No sore throat. Respiratory: Positive cough Cardiovascular: Denies chest pain Gastrointestinal: Denies abdominal pain Genitourinary: Negative for dysuria. Musculoskeletal: Negative for back pain. Skin: Negative for rash. Psychiatric: no mood changes,     ____________________________________________   PHYSICAL EXAM:  VITAL SIGNS: ED Triage Vitals  Enc Vitals Group     BP 10/28/19 1453 (!) 113/59     Pulse Rate 10/28/19 1453 89     Resp 10/28/19 1453 (!) 95     Temp 10/28/19 1453 99.1 F (37.3 C)     Temp Source 10/28/19 1453 Oral     SpO2 10/28/19 1453 95 %     Weight 10/28/19 1454 135 lb (61.2 kg)     Height 10/28/19 1454 5\' 7"  (1.702 m)     Head Circumference --      Peak Flow --      Pain Score 10/28/19 1454 0     Pain Loc --      Pain Edu? --      Excl. in GC? --     Constitutional: Alert and oriented. Well appearing and in no acute  distress. Eyes: Conjunctivae are normal.  Head: Atraumatic. Nose: No congestion/rhinnorhea. Mouth/Throat: Mucous membranes are moist.  Neck:  supple no lymphadenopathy noted Cardiovascular: Normal rate, regular rhythm. Heart sounds are normal Respiratory: Normal respiratory effort.  No retractions, lungs c t a  GU: deferred Musculoskeletal: FROM all extremities, warm and well perfused Neurologic:  Normal speech and language.  Skin:  Skin is warm, dry and intact. No rash noted. Psychiatric: Mood and affect are normal. Speech and behavior are normal.  ____________________________________________   LABS (all labs ordered are listed, but only abnormal results are displayed)  Labs Reviewed  RESPIRATORY PANEL BY  RT PCR (FLU A&B, COVID) - Abnormal; Notable for the following components:      Result Value   SARS Coronavirus 2 by RT PCR POSITIVE (*)    All other components within normal limits   ____________________________________________   ____________________________________________  RADIOLOGY  Chest x-ray is negative  ____________________________________________   PROCEDURES  Procedure(s) performed: No  Procedures    ____________________________________________   INITIAL IMPRESSION / ASSESSMENT AND PLAN / ED COURSE  Pertinent labs & imaging results that were available during my care of the patient were reviewed by me and considered in my medical decision making (see chart for details).   Patient is a 44 year old female presents with Covid-like symptoms.  See HPI.  Physical exam is unremarkable.  Due to her Covid-like symptoms of chest x-ray and Covid/flu test will be ordered.   Covid test positive.  Chest x-ray is normal.  I did explain findings to the patient.  She is to quarantine until it has been 14 days from onset of symptoms.  She is to notify anybody she has had close contact with that she is positive for Covid.  She is given prescription for Z-Pak and steroid pack.  Return emergency department worsening.  Follow-up with regular doctor if not improving in 5 to 7 days.  Susan Terry was evaluated in Emergency Department on 10/28/2019 for the symptoms described in the history of present illness. She was evaluated in the context of the global COVID-19 pandemic, which necessitated consideration that the patient might be at risk for infection with the SARS-CoV-2 virus that causes COVID-19. Institutional protocols and algorithms that pertain to the evaluation of patients at risk for COVID-19 are in a state of rapid change based on information released by regulatory bodies including the CDC and federal and state organizations. These policies and algorithms were followed during the  patient's care in the ED.    As part of my medical decision making, I reviewed the following data within the electronic MEDICAL RECORD NUMBER Nursing notes reviewed and incorporated, Labs reviewed , Old chart reviewed, Radiograph reviewed , Notes from prior ED visits and [place] Controlled Substance Database  ____________________________________________   FINAL CLINICAL IMPRESSION(S) / ED DIAGNOSES  Final diagnoses:  COVID-19      NEW MEDICATIONS STARTED DURING THIS VISIT:  New Prescriptions   AZITHROMYCIN (ZITHROMAX Z-PAK) 250 MG TABLET    2 pills today then 1 pill a day for 4 days   PREDNISONE (STERAPRED UNI-PAK 21 TAB) 10 MG (21) TBPK TABLET    Take 6 pills on day one then decrease by 1 pill each day     Note:  This document was prepared using Dragon voice recognition software and may include unintentional dictation errors.    Faythe Ghee, PA-C 10/28/19 1722    Sharman Cheek, MD 10/29/19 [phone]

## 2019-10-29 ENCOUNTER — Telehealth: Payer: Self-pay | Admitting: Physician Assistant

## 2019-10-29 NOTE — Telephone Encounter (Signed)
Called to Discuss with patient about Covid symptoms and the use of the monoclonal antibody infusion for those with mild to moderate Covid symptoms and at a high risk of hospitalization.     Pt appears to qualify for this infusion due to co-morbid conditions and/or a member of an at-risk group in accordance with the FDA Emergency Use Authorization.    Unable to reach pt - no phone number, no MyChart.   Roe Rutherford Nova Evett, PA-C 10/29/2019, 9:13 AM

## 2020-03-19 ENCOUNTER — Emergency Department
Admission: EM | Admit: 2020-03-19 | Discharge: 2020-03-19 | Discharge status: Against Medical Advice | Payer: Medicaid Other

## 2020-03-19 NOTE — ED Triage Notes (Signed)
First Nurse Note:  States "I think I have an infection again".  Had a sore to right forearm about 2 months ago that has gotten better.  States she has been taking antibiotics.  A new sore started on left cheek about one week ago.  Patient is AAOx3.  Skin warm and dry. NAD

## 2020-03-31 ENCOUNTER — Other Ambulatory Visit: Payer: Self-pay

## 2020-03-31 ENCOUNTER — Inpatient Hospital Stay
Admission: EM | Admit: 2020-03-31 | Discharge: 2020-04-17 | DRG: 540 | Disposition: A | Discharge status: Home / Self Care | Payer: Self-pay | Attending: Internal Medicine | Admitting: Internal Medicine

## 2020-03-31 ENCOUNTER — Emergency Department: Payer: Self-pay

## 2020-03-31 ENCOUNTER — Encounter: Payer: Self-pay | Admitting: Intensive Care

## 2020-03-31 DIAGNOSIS — F1110 Opioid abuse, uncomplicated: Secondary | ICD-10-CM

## 2020-03-31 DIAGNOSIS — M4632 Infection of intervertebral disc (pyogenic), cervical region: Secondary | ICD-10-CM | POA: Diagnosis present

## 2020-03-31 DIAGNOSIS — B373 Candidiasis of vulva and vagina: Secondary | ICD-10-CM | POA: Diagnosis not present

## 2020-03-31 DIAGNOSIS — F17210 Nicotine dependence, cigarettes, uncomplicated: Secondary | ICD-10-CM | POA: Diagnosis present

## 2020-03-31 DIAGNOSIS — F1123 Opioid dependence with withdrawal: Secondary | ICD-10-CM | POA: Diagnosis not present

## 2020-03-31 DIAGNOSIS — F1990 Other psychoactive substance use, unspecified, uncomplicated: Secondary | ICD-10-CM

## 2020-03-31 DIAGNOSIS — Z79899 Other long term (current) drug therapy: Secondary | ICD-10-CM

## 2020-03-31 DIAGNOSIS — L0390 Cellulitis, unspecified: Secondary | ICD-10-CM

## 2020-03-31 DIAGNOSIS — L03119 Cellulitis of unspecified part of limb: Secondary | ICD-10-CM

## 2020-03-31 DIAGNOSIS — M4642 Discitis, unspecified, cervical region: Secondary | ICD-10-CM

## 2020-03-31 DIAGNOSIS — F17200 Nicotine dependence, unspecified, uncomplicated: Secondary | ICD-10-CM

## 2020-03-31 DIAGNOSIS — Z809 Family history of malignant neoplasm, unspecified: Secondary | ICD-10-CM

## 2020-03-31 DIAGNOSIS — Z20822 Contact with and (suspected) exposure to covid-19: Secondary | ICD-10-CM | POA: Diagnosis present

## 2020-03-31 DIAGNOSIS — M4622 Osteomyelitis of vertebra, cervical region: Principal | ICD-10-CM | POA: Diagnosis present

## 2020-03-31 DIAGNOSIS — E871 Hypo-osmolality and hyponatremia: Secondary | ICD-10-CM | POA: Diagnosis not present

## 2020-03-31 DIAGNOSIS — B3731 Acute candidiasis of vulva and vagina: Secondary | ICD-10-CM

## 2020-03-31 DIAGNOSIS — D72829 Elevated white blood cell count, unspecified: Secondary | ICD-10-CM

## 2020-03-31 DIAGNOSIS — L03113 Cellulitis of right upper limb: Secondary | ICD-10-CM | POA: Diagnosis present

## 2020-03-31 DIAGNOSIS — Z720 Tobacco use: Secondary | ICD-10-CM | POA: Diagnosis present

## 2020-03-31 DIAGNOSIS — M542 Cervicalgia: Secondary | ICD-10-CM

## 2020-03-31 DIAGNOSIS — M25572 Pain in left ankle and joints of left foot: Secondary | ICD-10-CM

## 2020-03-31 DIAGNOSIS — B9562 Methicillin resistant Staphylococcus aureus infection as the cause of diseases classified elsewhere: Secondary | ICD-10-CM | POA: Diagnosis present

## 2020-03-31 DIAGNOSIS — Z981 Arthrodesis status: Secondary | ICD-10-CM

## 2020-03-31 HISTORY — DX: Other psychoactive substance abuse, uncomplicated: F19.10

## 2020-03-31 HISTORY — DX: Osteomyelitis, unspecified: M86.9

## 2020-03-31 LAB — CBC WITH DIFFERENTIAL/PLATELET
Abs Immature Granulocytes: 0.01 10*3/uL (ref 0.00–0.07)
Basophils Absolute: 0 10*3/uL (ref 0.0–0.1)
Basophils Relative: 0 %
Eosinophils Absolute: 0 10*3/uL (ref 0.0–0.5)
Eosinophils Relative: 0 %
HCT: 39.9 % (ref 36.0–46.0)
Hemoglobin: 12.8 g/dL (ref 12.0–15.0)
Immature Granulocytes: 0 %
Lymphocytes Relative: 14 %
Lymphs Abs: 0.9 10*3/uL (ref 0.7–4.0)
MCH: 26.7 pg (ref 26.0–34.0)
MCHC: 32.1 g/dL (ref 30.0–36.0)
MCV: 83.3 fL (ref 80.0–100.0)
Monocytes Absolute: 0.5 10*3/uL (ref 0.1–1.0)
Monocytes Relative: 7 %
Neutro Abs: 5.2 10*3/uL (ref 1.7–7.7)
Neutrophils Relative %: 79 %
Platelets: 137 10*3/uL — ABNORMAL LOW (ref 150–400)
RBC: 4.79 MIL/uL (ref 3.87–5.11)
RDW: 14.6 % (ref 11.5–15.5)
WBC: 6.7 10*3/uL (ref 4.0–10.5)
nRBC: 0 % (ref 0.0–0.2)

## 2020-03-31 LAB — URINALYSIS, COMPLETE (UACMP) WITH MICROSCOPIC
Bilirubin Urine: NEGATIVE
Glucose, UA: NEGATIVE mg/dL
Hgb urine dipstick: NEGATIVE
Ketones, ur: NEGATIVE mg/dL
Nitrite: NEGATIVE
Protein, ur: NEGATIVE mg/dL
Specific Gravity, Urine: 1.019 (ref 1.005–1.030)
pH: 6 (ref 5.0–8.0)

## 2020-03-31 LAB — LACTIC ACID, PLASMA: Lactic Acid, Venous: 1 mmol/L (ref 0.5–1.9)

## 2020-03-31 LAB — COMPREHENSIVE METABOLIC PANEL
ALT: 35 U/L (ref 0–44)
AST: 39 U/L (ref 15–41)
Albumin: 3.3 g/dL — ABNORMAL LOW (ref 3.5–5.0)
Alkaline Phosphatase: 85 U/L (ref 38–126)
Anion gap: 9 (ref 5–15)
BUN: 9 mg/dL (ref 6–20)
CO2: 22 mmol/L (ref 22–32)
Calcium: 8.5 mg/dL — ABNORMAL LOW (ref 8.9–10.3)
Chloride: 104 mmol/L (ref 98–111)
Creatinine, Ser: 0.54 mg/dL (ref 0.44–1.00)
GFR, Estimated: >60 mL/min (ref >60)
Glucose, Bld: 111 mg/dL — ABNORMAL HIGH (ref 70–99)
Potassium: 4.2 mmol/L (ref 3.5–5.1)
Sodium: 135 mmol/L (ref 135–145)
Total Bilirubin: 0.7 mg/dL (ref 0.3–1.2)
Total Protein: 7.4 g/dL (ref 6.5–8.1)

## 2020-03-31 LAB — RESP PANEL BY RT-PCR (FLU A&B, COVID) ARPGX2
Influenza A by PCR: NEGATIVE
Influenza B by PCR: NEGATIVE
SARS Coronavirus 2 by RT PCR: NEGATIVE

## 2020-03-31 LAB — TROPONIN I (HIGH SENSITIVITY): Troponin I (High Sensitivity): 3 ng/L (ref <18)

## 2020-03-31 IMAGING — MR MR CERVICAL SPINE W/O CM
5 series · 41 of 48 positions shown · non-contrast
Comparison: None.

CLINICAL DATA: Severe neck pain. History of IV drug abuse and
discitis/osteomyelitis.

EXAM:
MRI CERVICAL SPINE WITHOUT CONTRAST
TECHNIQUE: Multiplanar, multisequence MR imaging of the cervical spine was
performed. No intravenous contrast was administered.

[Series 5: T2 · sagittal · 3.0mm · 0.62mm/px · 6 of 15 slices shown (1 of 2)]
[im 1/15]
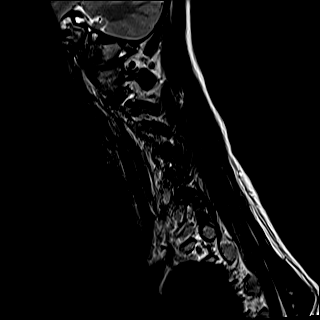
[im 3/15]
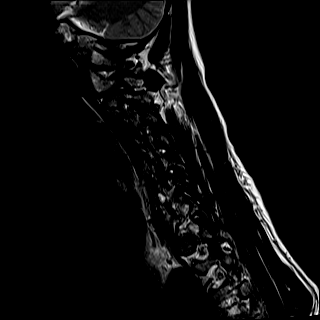
[im 6/15]
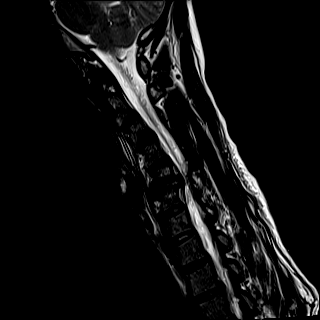
[im 9/15]
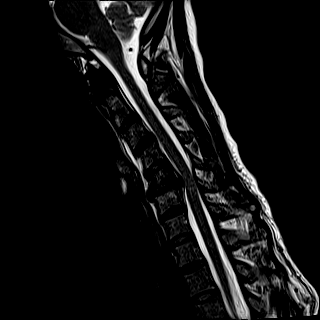
[im 12/15]
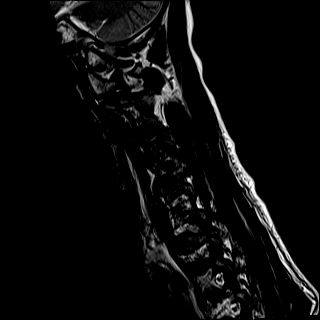
[im 15/15]
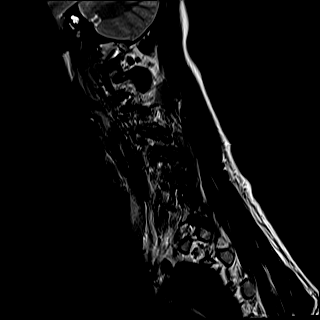

[Series 6: FLAIR · sagittal · 3.0mm · 0.89mm/px · 7 of 15 slices shown]
[im 1/15]
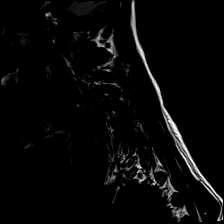
[im 3/15]
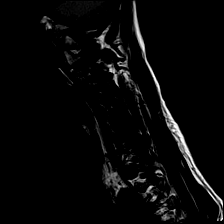
[im 5/15]
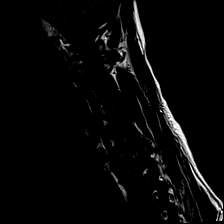
[im 8/15]
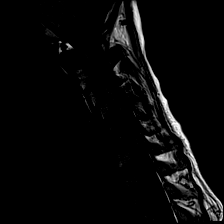
[im 10/15]
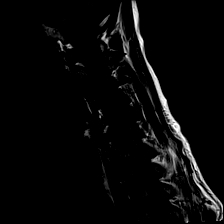
[im 12/15]
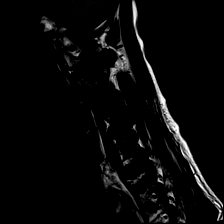
[im 15/15]
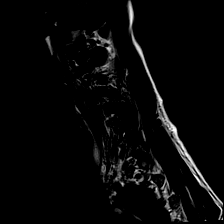

[Series 7: STIR · sagittal · 3.0mm · 0.62mm/px · 7 of 15 slices shown]
[im 1/15]
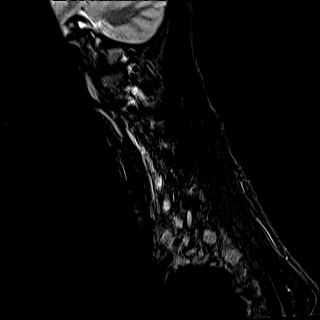
[im 3/15]
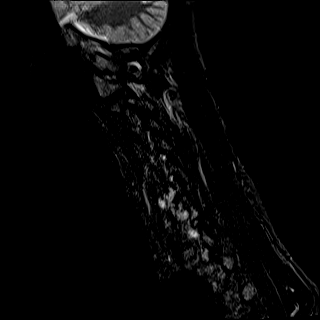
[im 5/15]
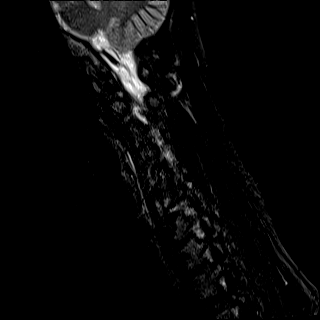
[im 8/15]
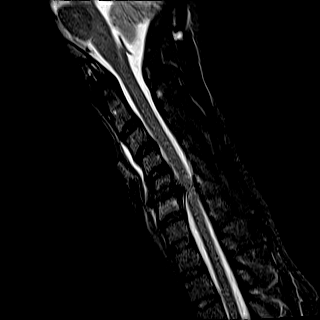
[im 10/15]
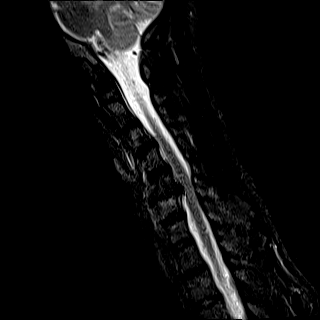
[im 12/15]
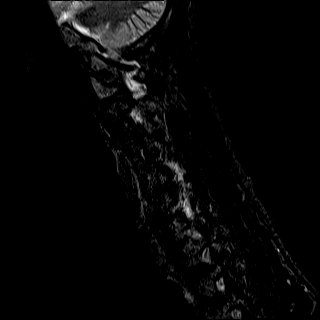
[im 15/15]
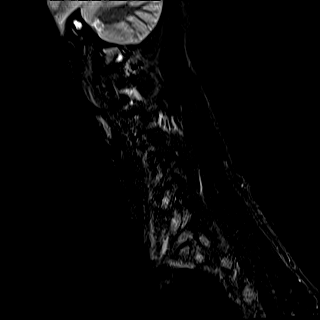

[Series 8: T2 · axial · 3.0mm · 0.70mm/px · z∈[-65,+29]mm · 13 of 29 slices shown (2 of 2)]
[im 1/29]
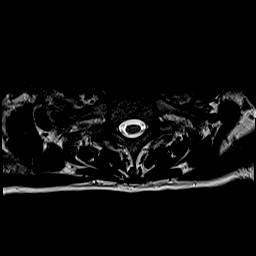
[im 3/29]
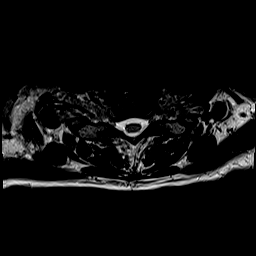
[im 5/29]
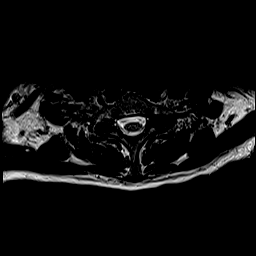
[im 7/29]
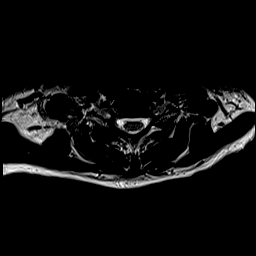
[im 9/29]
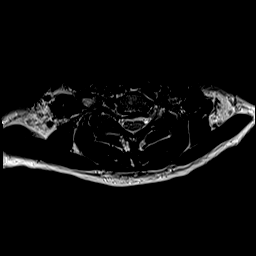
[im 11/29]
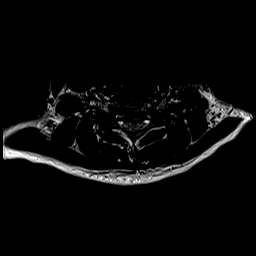
[im 13/29]
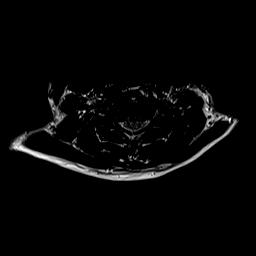
[im 16/29]
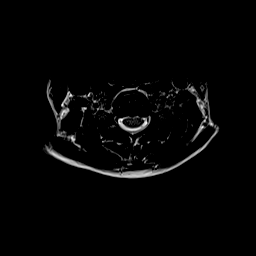
[im 18/29]
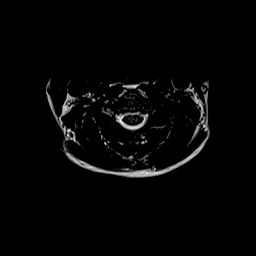
[im 20/29]
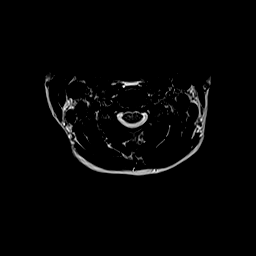
[im 22/29]
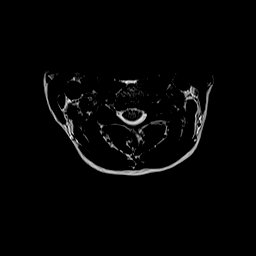
[im 24/29]
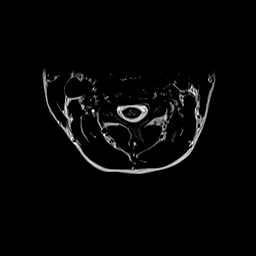
[im 29/29]
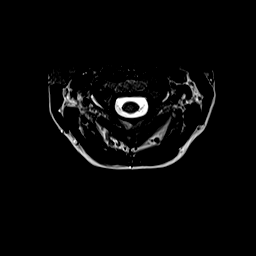

[Series 9: ax mpgr · axial · 3.0mm · 0.35mm/px · z∈[-65,+29]mm · 8 of 29 slices shown]
[im 1/29]
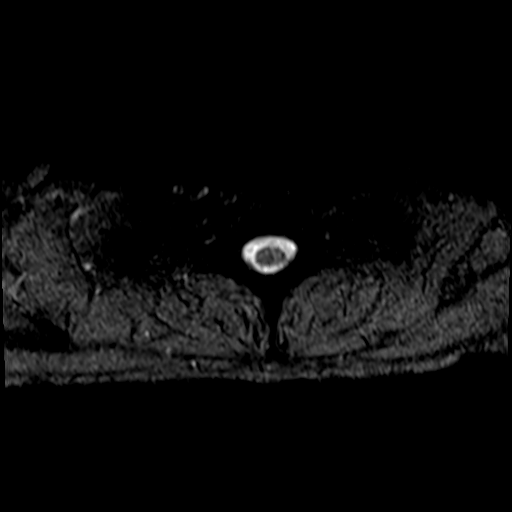
[im 5/29]
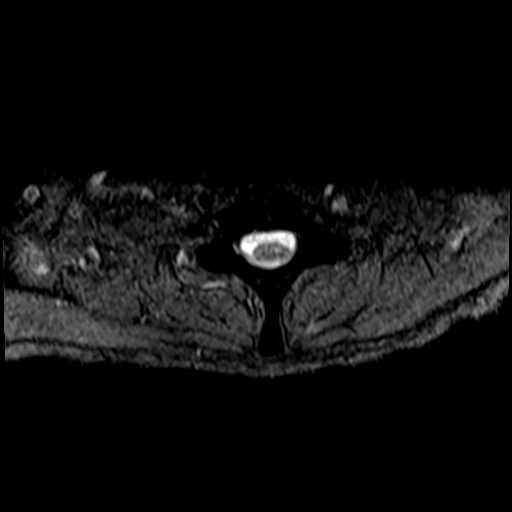
[im 9/29]
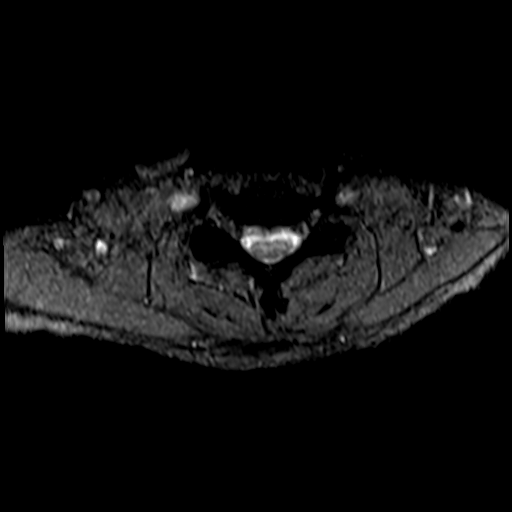
[im 13/29]
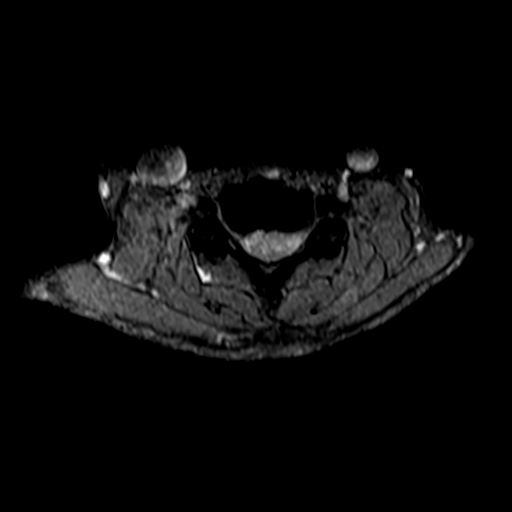
[im 16/29]
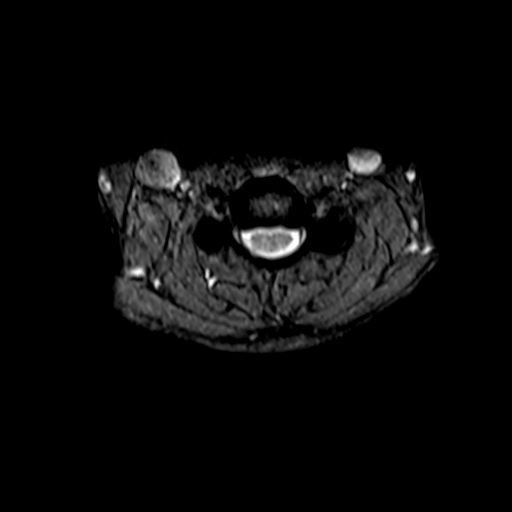
[im 20/29]
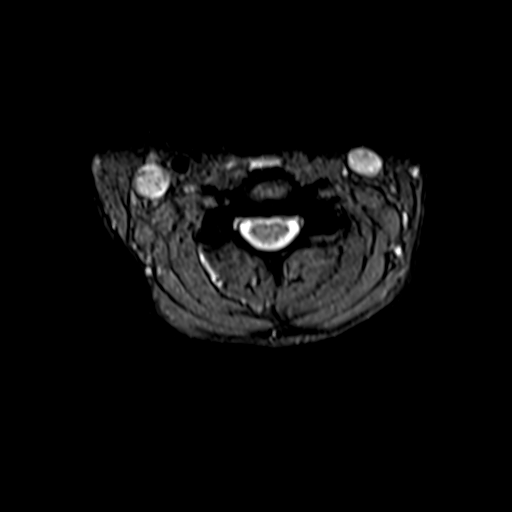
[im 24/29]
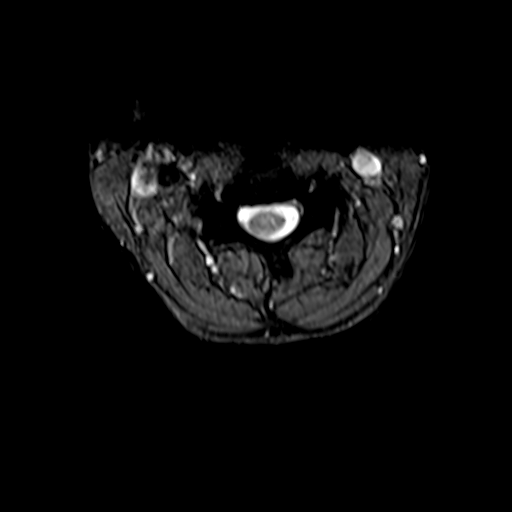
[im 29/29]
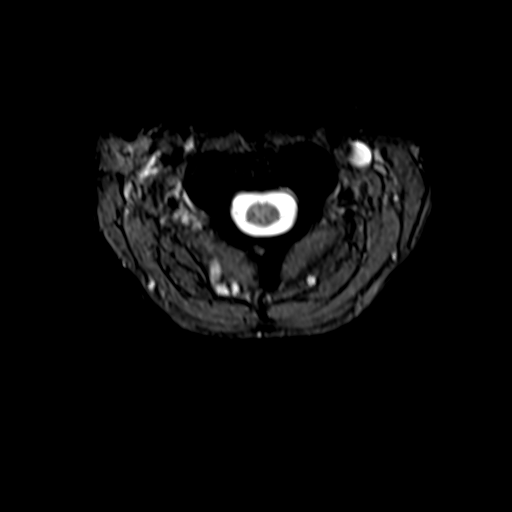

[41 of 48 positions shown; findings below may reference images not displayed]

FINDINGS: Alignment: Mild kyphosis centered about the C5-6 level is
identified. There is also trace retrolisthesis C5 on C6.

Vertebrae: There is endplate irregularity and mild edema about the
C5-6 level. No fluid is seen within the disc interspace but there is
a small volume of prevertebral fluid tracking from the C2-3 level to
the C6-7 level. Vertebral body signal is otherwise normal.

Cord: Normal signal throughout.

Posterior Fossa, vertebral arteries, paraspinal tissues: As
described below. Otherwise negative.

Disc levels:

C2-3: Negative.

C3-4: Mild-to-moderate facet degenerative change on the left. There
is some uncovertebral spurring on the left. The central canal and
right foramen are open. Mild left foraminal narrowing.

C4-5: There is some uncovertebral spurring on the left causing mild
foraminal narrowing. The central canal and right foramen are open.

C5-6: Broad-based disc osteophyte complex flattens the cord. There
is moderately severe to severe bilateral foraminal narrowing, worse
on the right.

C6-7: Minimal disc bulge. No stenosis.

C7-T1: There is some facet degenerative disease. This level is
otherwise negative.
IMPRESSION: Abnormal appearance of the C5-6 level is most consistent with the
patient's prior history of discitis and osteomyelitis. There is mild
edema in the endplates and some prevertebral fluid worrisome for
active infection. Postcontrast imaging of the cervical spine is
recommended for further evaluation.

Mild kyphosis centered about the C5-6 level and a broad-based disc
bulge result in flattening of the cord.

## 2020-03-31 IMAGING — DX DG CHEST 1V PORT
1 series · 1 of 1 positions shown · non-contrast
Comparison: None.

CLINICAL DATA: Pain.  Patient complains of osteomyelitis in neck.

EXAM:
PORTABLE CHEST 1 VIEW

[chest ap]
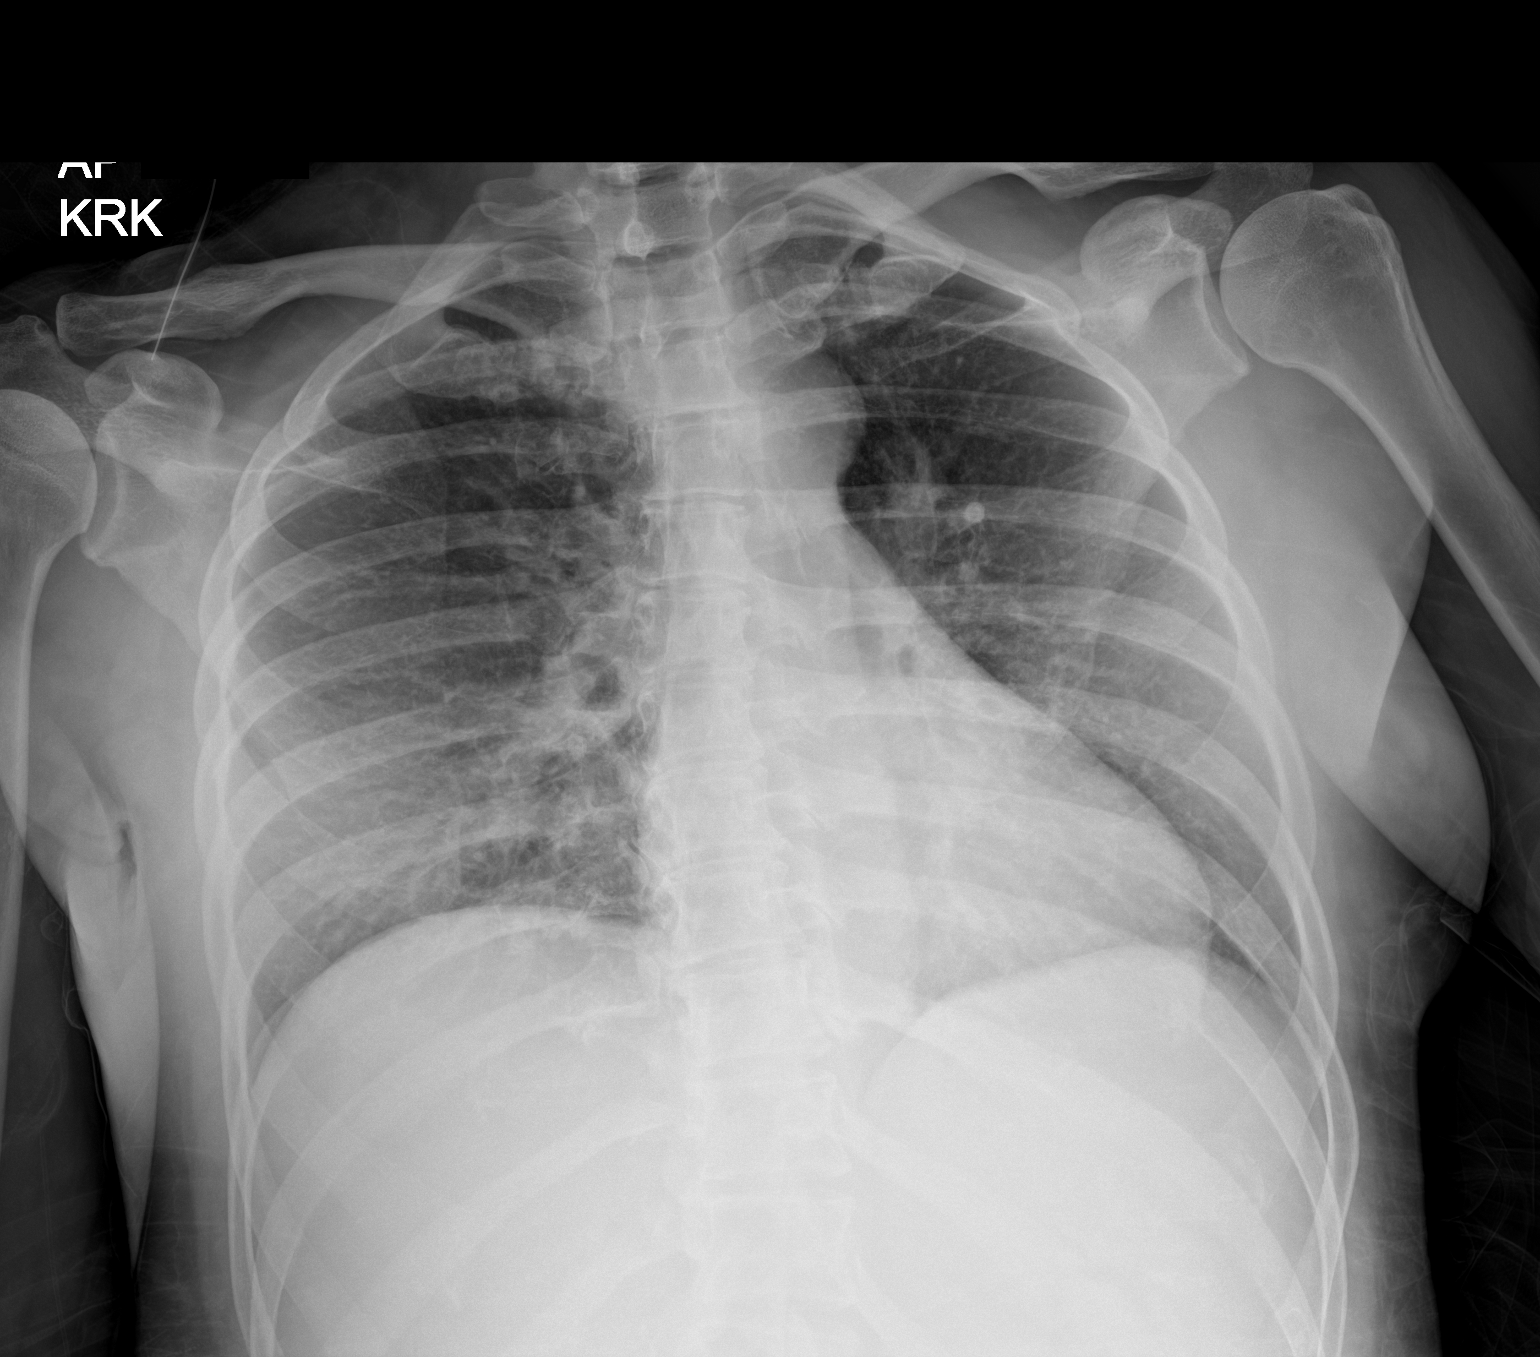

[1 of 1 positions shown; findings below may reference images not displayed]

FINDINGS: The heart, hila, and mediastinum are normal. No pneumothorax. No
nodules or masses. No focal infiltrates.
IMPRESSION: No active disease.

## 2020-03-31 MED ORDER — VANCOMYCIN HCL 1500 MG/300ML IV SOLN
1500.0000 mg | Freq: Once | INTRAVENOUS | Status: AC
  Administered 2020-03-31: 1500 mg via INTRAVENOUS
  Filled 2020-03-31 (×2): qty 300

## 2020-03-31 MED ORDER — ASPIRIN EC 81 MG PO TBEC
81.0000 mg | DELAYED_RELEASE_TABLET | Freq: Every day | ORAL | Status: DC
  Administered 2020-04-01: 81 mg via ORAL
  Filled 2020-03-31 (×2): qty 1

## 2020-03-31 MED ORDER — SODIUM CHLORIDE 0.9 % IV SOLN: INTRAVENOUS | Status: AC

## 2020-03-31 MED ORDER — NICOTINE 21 MG/24HR TD PT24
21.0000 mg | MEDICATED_PATCH | Freq: Every day | TRANSDERMAL | Status: DC
  Filled 2020-03-31 (×9): qty 1

## 2020-03-31 MED ORDER — HYDROMORPHONE HCL 1 MG/ML IJ SOLN
1.0000 mg | INTRAMUSCULAR | Status: DC | PRN
  Administered 2020-03-31 – 2020-04-01 (×3): 1 mg via INTRAVENOUS
  Filled 2020-03-31 (×3): qty 1

## 2020-03-31 MED ORDER — MORPHINE SULFATE (PF) 4 MG/ML IV SOLN
4.0000 mg | Freq: Once | INTRAVENOUS | Status: AC
  Administered 2020-03-31: 4 mg via INTRAVENOUS
  Filled 2020-03-31: qty 1

## 2020-03-31 MED ORDER — ONDANSETRON HCL 4 MG/2ML IJ SOLN
4.0000 mg | Freq: Once | INTRAMUSCULAR | Status: AC
  Administered 2020-03-31: 4 mg via INTRAVENOUS
  Filled 2020-03-31: qty 2

## 2020-03-31 MED ORDER — VANCOMYCIN HCL IN DEXTROSE 1-5 GM/200ML-% IV SOLN: 1000.0000 mg | Freq: Once | INTRAVENOUS | Status: DC

## 2020-03-31 MED ORDER — SODIUM CHLORIDE 0.9 % IV SOLN
2.0000 g | Freq: Once | INTRAVENOUS | Status: AC
  Administered 2020-03-31: 2 g via INTRAVENOUS
  Filled 2020-03-31: qty 2

## 2020-03-31 MED ORDER — HEPARIN SODIUM (PORCINE) 5000 UNIT/ML IJ SOLN: 5000.0000 [IU] | Freq: Three times a day (TID) | INTRAMUSCULAR | Status: DC

## 2020-03-31 MED ORDER — PANTOPRAZOLE SODIUM 40 MG IV SOLR
40.0000 mg | INTRAVENOUS | Status: DC
  Administered 2020-04-01 – 2020-04-10 (×10): 40 mg via INTRAVENOUS
  Filled 2020-03-31 (×10): qty 40

## 2020-03-31 MED ORDER — ONDANSETRON HCL 4 MG/2ML IJ SOLN
4.0000 mg | Freq: Four times a day (QID) | INTRAMUSCULAR | Status: DC | PRN
  Administered 2020-04-01: 4 mg via INTRAVENOUS
  Filled 2020-03-31 (×2): qty 2

## 2020-03-31 MED ORDER — METRONIDAZOLE IN NACL 5-0.79 MG/ML-% IV SOLN
500.0000 mg | Freq: Once | INTRAVENOUS | Status: AC
  Administered 2020-03-31: 500 mg via INTRAVENOUS
  Filled 2020-03-31: qty 100

## 2020-03-31 MED ORDER — MORPHINE SULFATE (PF) 4 MG/ML IV SOLN
4.0000 mg | Freq: Once | INTRAVENOUS | Status: AC
Start: 2020-03-31 — End: 2020-03-31
  Administered 2020-03-31: 4 mg via INTRAVENOUS
  Filled 2020-03-31: qty 1

## 2020-03-31 MED ORDER — ONDANSETRON HCL 4 MG PO TABS: 4.0000 mg | ORAL_TABLET | Freq: Four times a day (QID) | ORAL | Status: DC | PRN

## 2020-03-31 NOTE — ED Notes (Signed)
Pt continues to scream out in pain. Pt educated that she just received pain medication.

## 2020-03-31 NOTE — ED Notes (Signed)
X RAY at bedside 

## 2020-03-31 NOTE — ED Triage Notes (Signed)
Patient presents by EMS from home with extreme neck pain. Reports hx osteomyelitis in spine and pain was the same. Pain started two days ago and has worsened. Patient crying out in triage. Patient states "why am I in the lobby! I called EMS and you always go to a room when you come in by EMS!"
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
## 2020-03-31 NOTE — ED Notes (Signed)
Pt presents to ED with c/o of having neck pain that started 2 days ago and has since gotten worse. Pt states a HX of osteomyelitis in her spine about 1.5-2 years ago and states "it feels just like that" Pt denies fevers or chills at this time. Pt denies N/V/D. Pt is tearful at this time repeating "it hurts so bad, it hurts so bad". Pt is able to move neck at this time. Pt denies injury or trauma to this area recently, pt also denies numbness and tingling. Pt is A&ox4. Pt has multiple track marks and scabs up and down bilateral arms and pt states this is from recent drug use and states "Im sure that doesn't help".

## 2020-03-31 NOTE — ED Triage Notes (Signed)
Pt in via EMS from home with c/o neck and back pain. Pt with hx of infection of neck and back area and thinks it is flaring up again.

## 2020-03-31 NOTE — Consult Note (Signed)
PHARMACY -  BRIEF ANTIBIOTIC NOTE   Pharmacy has received consult(s) for vancomycin and cefepime from an ED provider.  The patient's profile has been reviewed for ht/wt/allergies/indication/available labs.    One time order(s) placed for cefepime 2 g and vancomycin 1500 mg  Further antibiotics/pharmacy consults should be ordered by admitting physician if indicated.                       Thank you, Derrek Gu 03/31/2020  6:42 PM

## 2020-03-31 NOTE — ED Notes (Signed)
Pt refused covid test at this time. Will try again at later time.

## 2020-03-31 NOTE — ED Notes (Signed)
Pt currently yelling out that she is still in pain after this RN went in room to collect UA, PA aware, new orders, see MAR.

## 2020-03-31 NOTE — ED Notes (Signed)
Lab came to assist with blood work, pt unable to sit still and continuously crying out in pain. Pt given pain medicine and reassured that medicine should start working soon. Lab unable to collect lab work at this time.

## 2020-03-31 NOTE — ED Triage Notes (Signed)
Lab here to assist with specimen collection. Pt uncooperative, states will be able to get labs done when she is in a bed.

## 2020-03-31 NOTE — ED Provider Notes (Signed)
[facility] Emergency Department Provider Note  ____________________________________________   Event Date/Time   First MD Initiated Contact with Patient 03/31/20 1344     (approximate)  I have reviewed the triage vital signs and the nursing notes.   HISTORY  Chief Complaint Osteomyelitis  and Neck Pain    HPI Susan Terry is a 45 y.o. female with history of IV drug use presents emergency department complaining of neck pain that started 2 days ago and states she probably has infection in her neck.  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  She states she has had fever for couple of days.  She last used heroin 2 weeks ago.  No chest pain or shortness of breath.  States everything is just very painful.  Pain is rated at 10/10    Past Medical History:  Diagnosis Date  . Drug abuse (HCC)   . Osteomyelitis [facility])     Patient Active Problem List   Diagnosis Date Noted  . Discitis of cervical region 03/31/2020  . Abscess of right forearm   . Cellulitis 04/06/2017    Past Surgical History:  Procedure Laterality Date  . BACK SURGERY    . INCISION AND DRAINAGE FOREARM / WRIST DEEP Right     Prior to Admission medications   Medication Sig Start Date End Date Taking? Authorizing Provider  azithromycin (ZITHROMAX Z-PAK) 250 MG tablet 2 pills today then 1 pill a day for 4 days 10/28/19   Sherrie Mustache Roselyn Bering, PA-C  elvitegravir-cobicistat-emtricitabine-tenofovir (GENVOYA) 150-150-200-10 MG TABS tablet Take 1 tablet by mouth daily with breakfast. 05/31/17   Loleta Rose, MD  elvitegravir-cobicistat-emtricitabine-tenofovir (GENVOYA) 150-150-200-10 MG TABS tablet Take 1 tablet by mouth daily with breakfast. 05/31/17   Loleta Rose, MD  predniSONE (STERAPRED UNI-PAK 21 TAB) 10 MG (21) TBPK tablet Take 6 pills on day one then decrease by 1 pill each day 10/28/19   Faythe Ghee, PA-C    Allergies Patient has no known allergies.  Family History   Problem Relation Age of Onset  . Cancer Mother        cancer throughout family    Social History Social History   Tobacco Use  . Smoking status: Current Every Day Smoker    Packs/day: 0.50    Types: Cigarettes  . Smokeless tobacco: Never Used  Vaping Use  . Vaping Use: Former  Substance Use Topics  . Alcohol use: No  . Drug use: Not Currently    Types: Methamphetamines, IV    Review of Systems  Constitutional: Positive fever/chills Eyes: No visual changes. ENT: No sore throat. Respiratory: Denies cough Cardiovascular: Denies chest pain Gastrointestinal: Denies abdominal pain Genitourinary: Negative for dysuria. Musculoskeletal: Positive for back pain. Skin: Negative for rash. Psychiatric: no mood changes,     ____________________________________________   PHYSICAL EXAM:  VITAL SIGNS: ED Triage Vitals [03/31/20 1150]  Enc Vitals Group     BP 133/78     Pulse Rate 84     Resp 20     Temp 98.3 F (36.8 C)     Temp Source Oral     SpO2 100 %     Weight 140 lb (63.5 kg)     Height 5\' 7"  (1.702 m)     Head Circumference      Peak Flow      Pain Score 10     Pain Loc      Pain Edu?  Excl. in GC?     Constitutional: Alert and oriented. Well appearing and in mild distress secondary discomfort. Eyes: Conjunctivae are normal.  Head: Atraumatic. Nose: No congestion/rhinnorhea. Mouth/Throat: Mucous membranes are moist.   Neck:  supple no lymphadenopathy noted Cardiovascular: Normal rate, regular rhythm. Heart sounds are normal Respiratory: Normal respiratory effort.  No retractions, lungs c t a  Abd: soft nontender bs normal all 4 quad GU: deferred Musculoskeletal: FROM all extremities, warm and well perfused, patient is not willing for me to roll her to examine her spine, screaming and crying when I touched the C-spine and trapezius muscle, uncomfortable when I touched the lumbar spine, multiple track marks noted on her arms Neurologic:  Normal speech  and language.  Skin:  Skin is warm, dry. No rash noted.  Multiple track marks noted on arms with redness and swelling surrounding some of them Psychiatric: Mood and affect are normal. Speech and behavior are normal.  ____________________________________________   LABS (all labs ordered are listed, but only abnormal results are displayed)  Labs Reviewed  COMPREHENSIVE METABOLIC PANEL - Abnormal; Notable for the following components:      Result Value   Glucose, Bld 111 (*)    Calcium 8.5 (*)    Albumin 3.3 (*)    All other components within normal limits  CBC WITH DIFFERENTIAL/PLATELET - Abnormal; Notable for the following components:   Platelets 137 (*)    All other components within normal limits  URINALYSIS, COMPLETE (UACMP) WITH MICROSCOPIC - Abnormal; Notable for the following components:   Color, Urine YELLOW (*)    APPearance HAZY (*)    Leukocytes,Ua SMALL (*)    Bacteria, UA RARE (*)    All other components within normal limits  RESP PANEL BY RT-PCR (FLU A&B, COVID) ARPGX2  CULTURE, BLOOD (ROUTINE X 2)  CULTURE, BLOOD (ROUTINE X 2)  LACTIC ACID, PLASMA  LACTIC ACID, PLASMA  C-REACTIVE PROTEIN  DRUG SCREEN 10 W/CONF, SERUM  SEDIMENTATION RATE  POC URINE PREG, ED  TROPONIN I (HIGH SENSITIVITY)  TROPONIN I (HIGH SENSITIVITY)   ____________________________________________   ____________________________________________  RADIOLOGY  Chest x-ray MRI of the C-spine  ____________________________________________   PROCEDURES  Procedure(s) performed: No  Procedures    ____________________________________________   INITIAL IMPRESSION / ASSESSMENT AND PLAN / ED COURSE  Pertinent labs & imaging results that were available during my care of the patient were reviewed by me and considered in my medical decision making (see chart for details).   Patient is 45 year old female presents with neck and back pain with concerns of osteomyelitis secondary to IV drug use.   See HPI physical exam shows patient to be uncomfortable although stable.  DDx: Sepsis, osteomyelitis, malingering, opiate withdrawal  CBC shows platelets of 137, comprehensive metabolic panel basically normal, urinalysis has small amount of leuks, no bacteria noted, will add a urine culture, lactic acid is normal, troponins normal  Chest x-ray reviewed by me confirmed by radiology to be normal  MRI of the C-spine shows osteomyelitis and a new infection,  Consult with Dr. Marcell Barlow.  Feels he can handle the infection here at [facility].  Wants me to have hospitalist admit and he will consult on the floor.  Paged hospitalist    ----------------------------------------- 6:42 PM on 03/31/2020 -----------------------------------------  Hospitalist return my call.  Will admit patient with consult to neurology.  Dr. Scotty Court I have discussed patient.  Ordered antibiotics for osteomyelitis , patient is stable at this time  Susan Terry was evaluated in Emergency  Department on 03/31/2020 for the symptoms described in the history of present illness. She was evaluated in the context of the global COVID-19 pandemic, which necessitated consideration that the patient might be at risk for infection with the SARS-CoV-2 virus that causes COVID-19. Institutional protocols and algorithms that pertain to the evaluation of patients at risk for COVID-19 are in a state of rapid change based on information released by regulatory bodies including the CDC and federal and state organizations. These policies and algorithms were followed during the patient's care in the ED.    As part of my medical decision making, I reviewed the following data within the electronic MEDICAL RECORD NUMBER Nursing notes reviewed and incorporated, Labs reviewed , Old chart reviewed, Radiograph reviewed , Discussed with admitting physician , A consult was requested and obtained from this/these consultant(s) Neurosurgery, Notes from prior ED visits  and [place] Controlled Substance Database  ____________________________________________   FINAL CLINICAL IMPRESSION(S) / ED DIAGNOSES  Final diagnoses:  Osteomyelitis of cervical spine (HCC)      NEW MEDICATIONS STARTED DURING THIS VISIT:  New Prescriptions   No medications on file     Note:  This document was prepared using Dragon voice recognition software and may include unintentional dictation errors.    Faythe Ghee, PA-C 03/31/20 1845    Sharman Cheek, MD 03/31/20 Jerene Bears

## 2020-03-31 NOTE — ED Notes (Addendum)
Lab called to collect drug screen

## 2020-04-01 ENCOUNTER — Inpatient Hospital Stay: Payer: Self-pay

## 2020-04-01 DIAGNOSIS — F1990 Other psychoactive substance use, unspecified, uncomplicated: Secondary | ICD-10-CM

## 2020-04-01 DIAGNOSIS — M4622 Osteomyelitis of vertebra, cervical region: Secondary | ICD-10-CM

## 2020-04-01 DIAGNOSIS — M25572 Pain in left ankle and joints of left foot: Secondary | ICD-10-CM

## 2020-04-01 DIAGNOSIS — F1110 Opioid abuse, uncomplicated: Secondary | ICD-10-CM

## 2020-04-01 DIAGNOSIS — M542 Cervicalgia: Secondary | ICD-10-CM

## 2020-04-01 LAB — TROPONIN I (HIGH SENSITIVITY): Troponin I (High Sensitivity): 7 ng/L (ref <18)

## 2020-04-01 LAB — SEDIMENTATION RATE: Sed Rate: 29 mm/hr — ABNORMAL HIGH (ref 0–20)

## 2020-04-01 IMAGING — CT CT CERVICAL SPINE W/O CM
3 of 4 series · 12 of 33 positions shown, 14 images · non-contrast
Comparison: MRI cervical spine without contrast [DATE] and MRI
cervical spine after contrast today. Plain films cervical spine
[DATE].

CLINICAL DATA: Patient with a history of IV drug abuse and discitis
and osteomyelitis. Severe neck pain.

EXAM:
CT CERVICAL SPINE WITHOUT CONTRAST
TECHNIQUE: Multidetector CT imaging of the cervical spine was performed without
intravenous contrast. Multiplanar CT image reconstructions were also
generated.

[Series 4: sagittal bone · sagittal · 0.24mm/px · 5 of 58 slices shown, 6 images]
[im 20/58  bone]
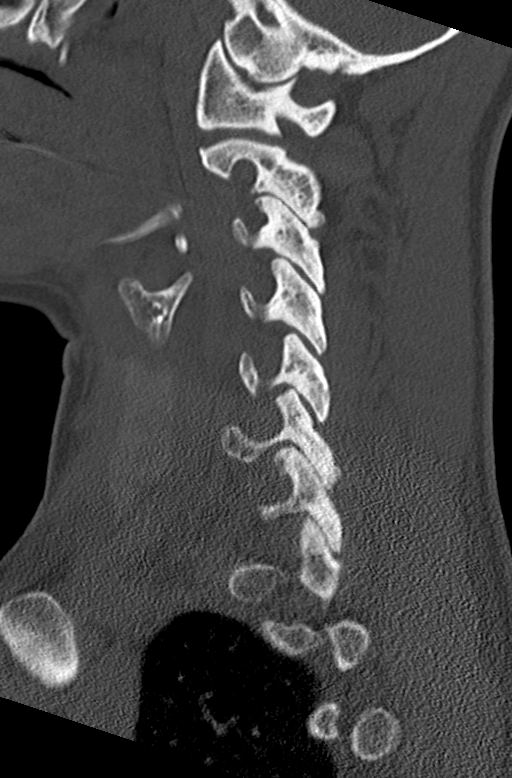
[im 24/58  bone]
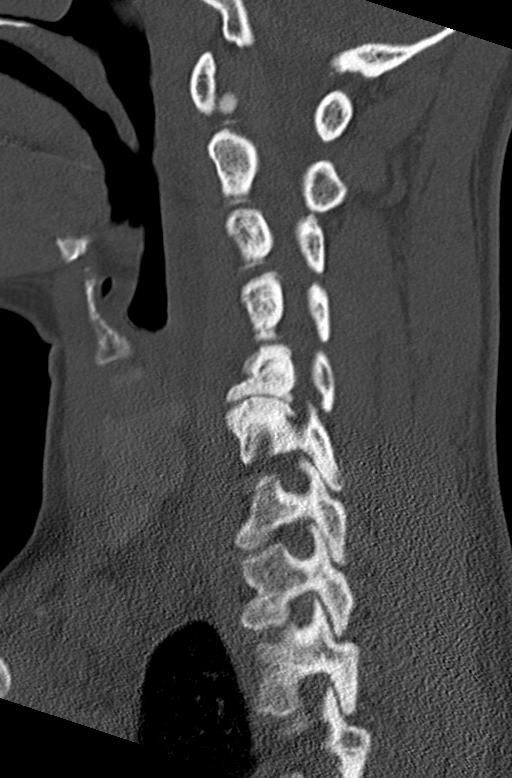
[im 29/58  soft-tissue]
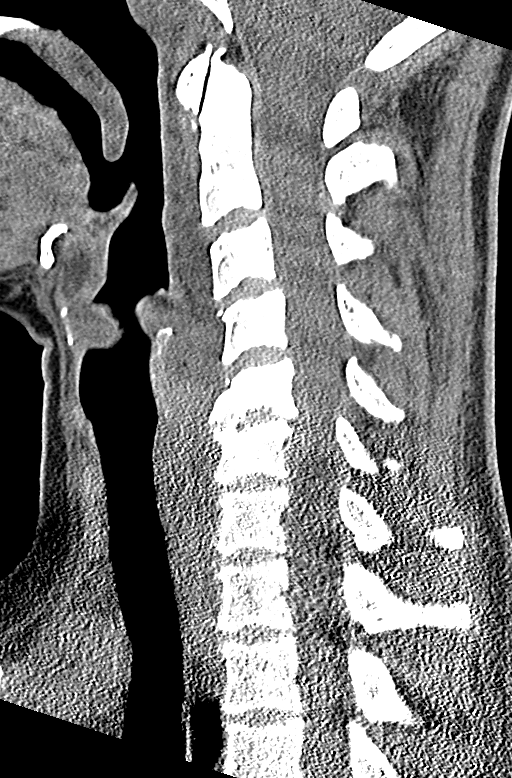
[im 29/58  bone]
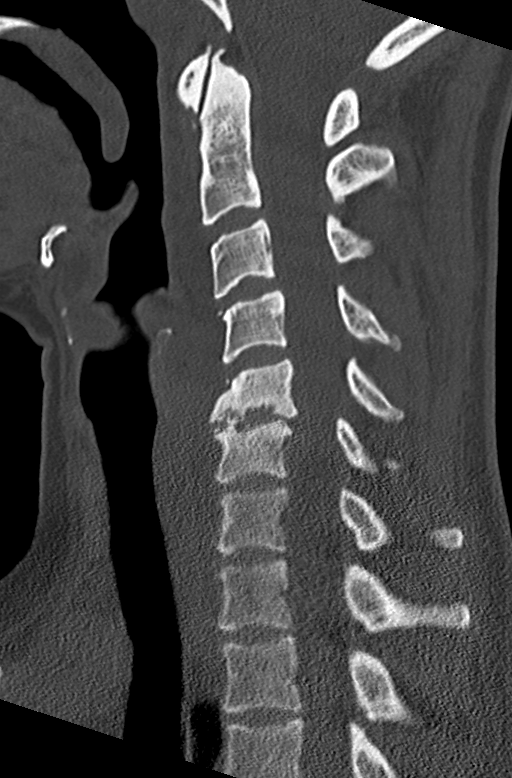
[im 34/58  bone]
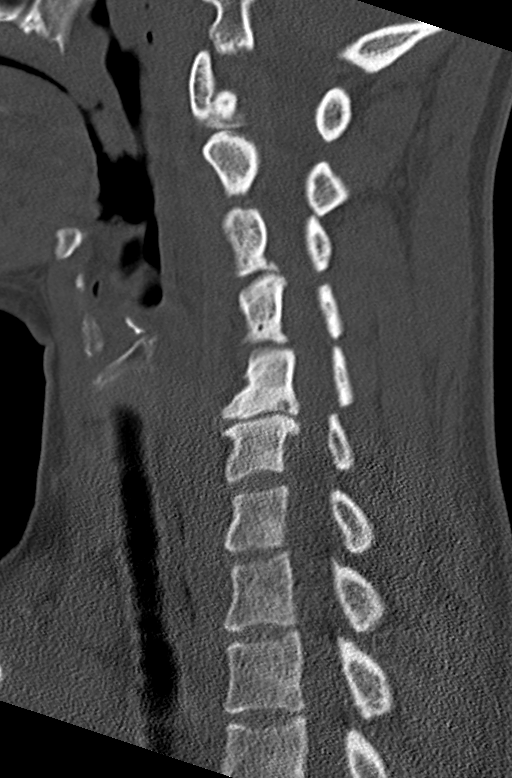
[im 39/58  bone]
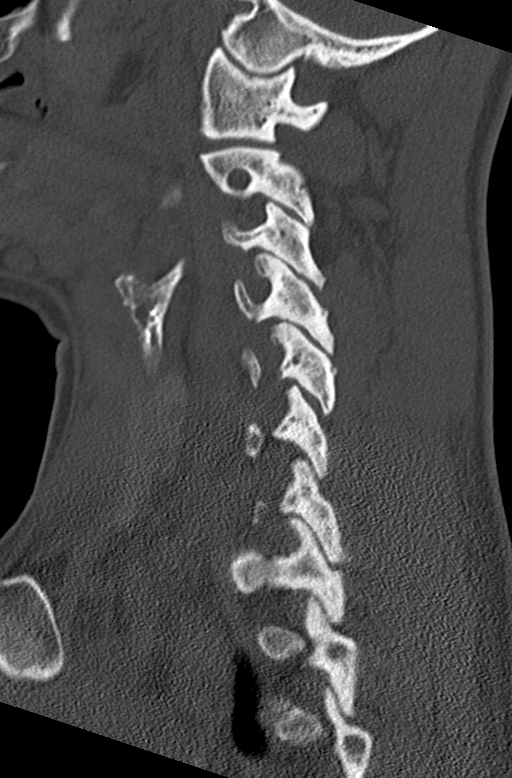

[Series 5: coronal bone · coronal · 0.22mm/px · 3 of 63 slices shown]
[im 13/63  bone]
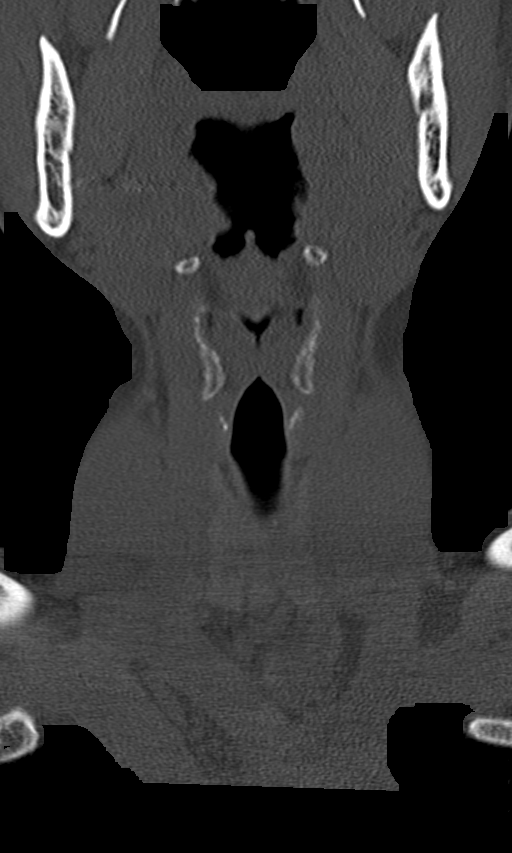
[im 25/63  bone]
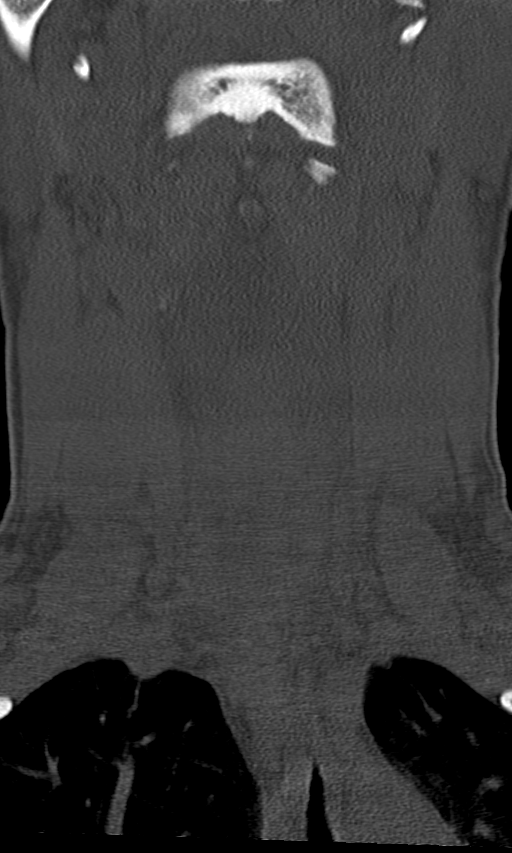
[im 38/63  bone]
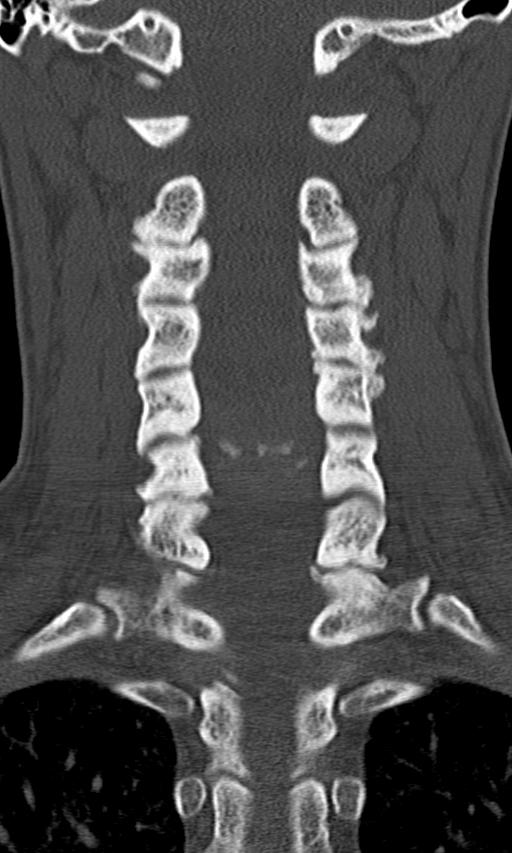

[Series 6: orthogonal bone · axial · 0.22mm/px · z∈[-353,-232]mm · 4 of 96 slices shown, 5 images]
[im 16/96  soft-tissue]
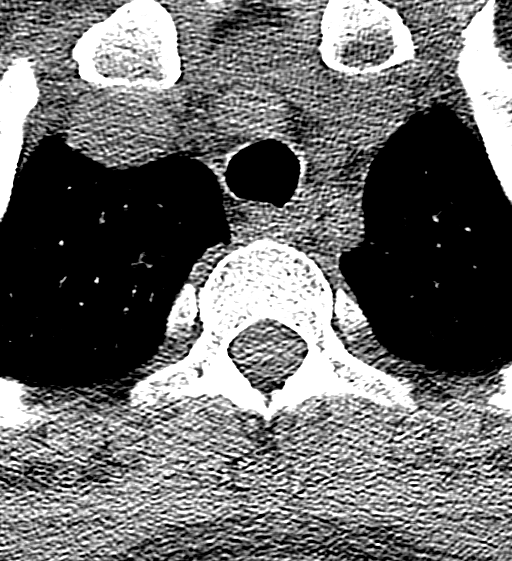
[im 16/96  bone]
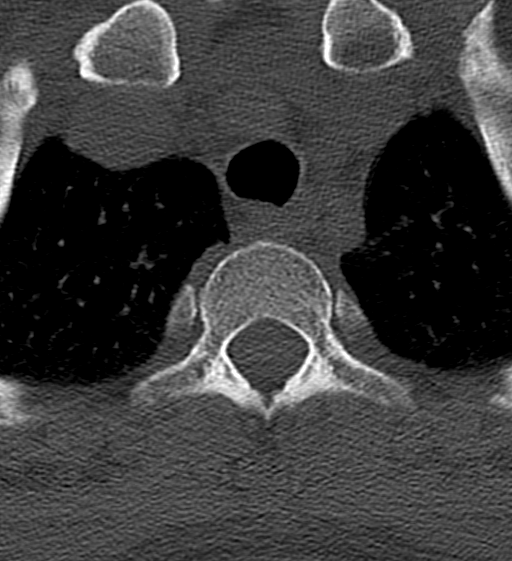
[im 32/96  bone]
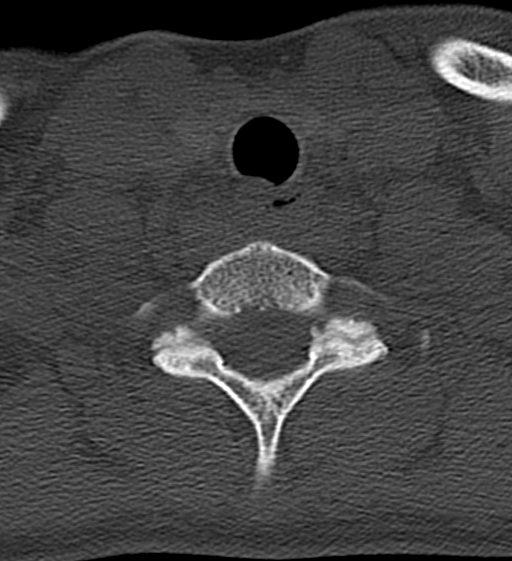
[im 64/96  bone]
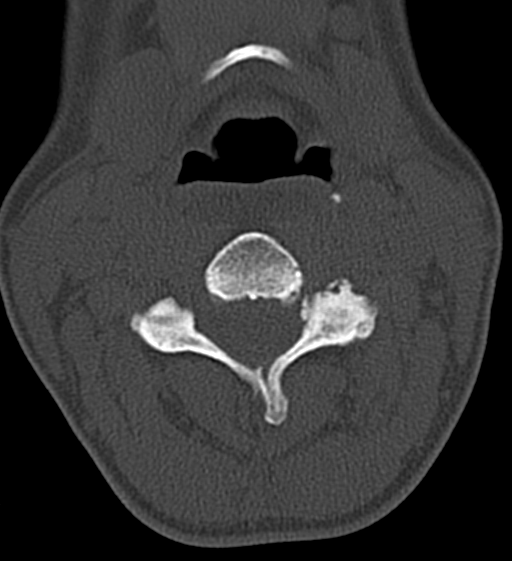
[im 80/96  bone]
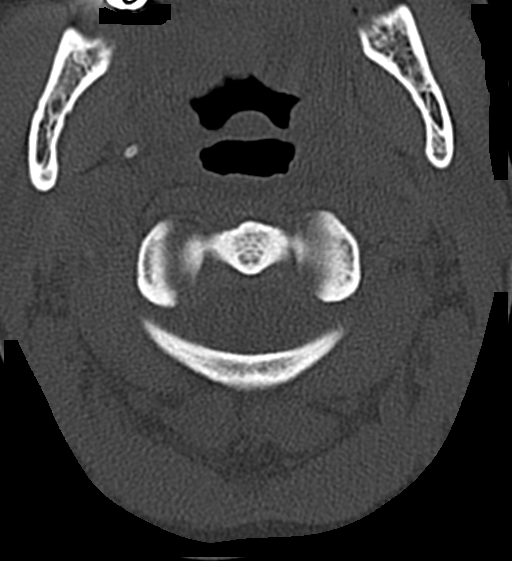

[12 of 33 positions shown; findings below may reference images not displayed]

FINDINGS: Alignment: There is mild kyphosis about the C5-6 level.
Straightening of lordosis is also noted.

Skull base and vertebrae: No fracture. There is endplate destructive
change at C5-6 and anterior and posterior endplate spurring.

Soft tissues and spinal canal: Prominent prevertebral fluid is
identified extending from approximately C5-6 to the anterior arch of
C1. Fluid measures approximately 7 cm craniocaudal x 0.8 cm AP x 3
cm transverse.

Disc levels: Central canal and bilateral foraminal narrowing are
seen at C5-6 due to a disc osteophyte complex and uncovertebral
disease. Scattered mild-to-moderate facet arthropathy noted.

Upper chest: Lung apices are clear.

Other: None.
IMPRESSION: Prevertebral fluid worrisome for infection. Please see report of MRI
scan with contrast today describing likely acute discitis and
osteomyelitis at C5-6.

Findings consistent with prior discitis and osteomyelitis at C5-6.
Endplate spurring and uncovertebral disease result in central canal
stenosis at C5-6.

## 2020-04-01 IMAGING — DX DG ANKLE PORT 2V*L*
3 series · 3 of 3 positions shown · non-contrast
Comparison: None.

CLINICAL DATA: LEFT ankle pain. No known injury. Initial encounter.

EXAM:
PORTABLE LEFT ANKLE - 2 VIEW

[ankle ap]
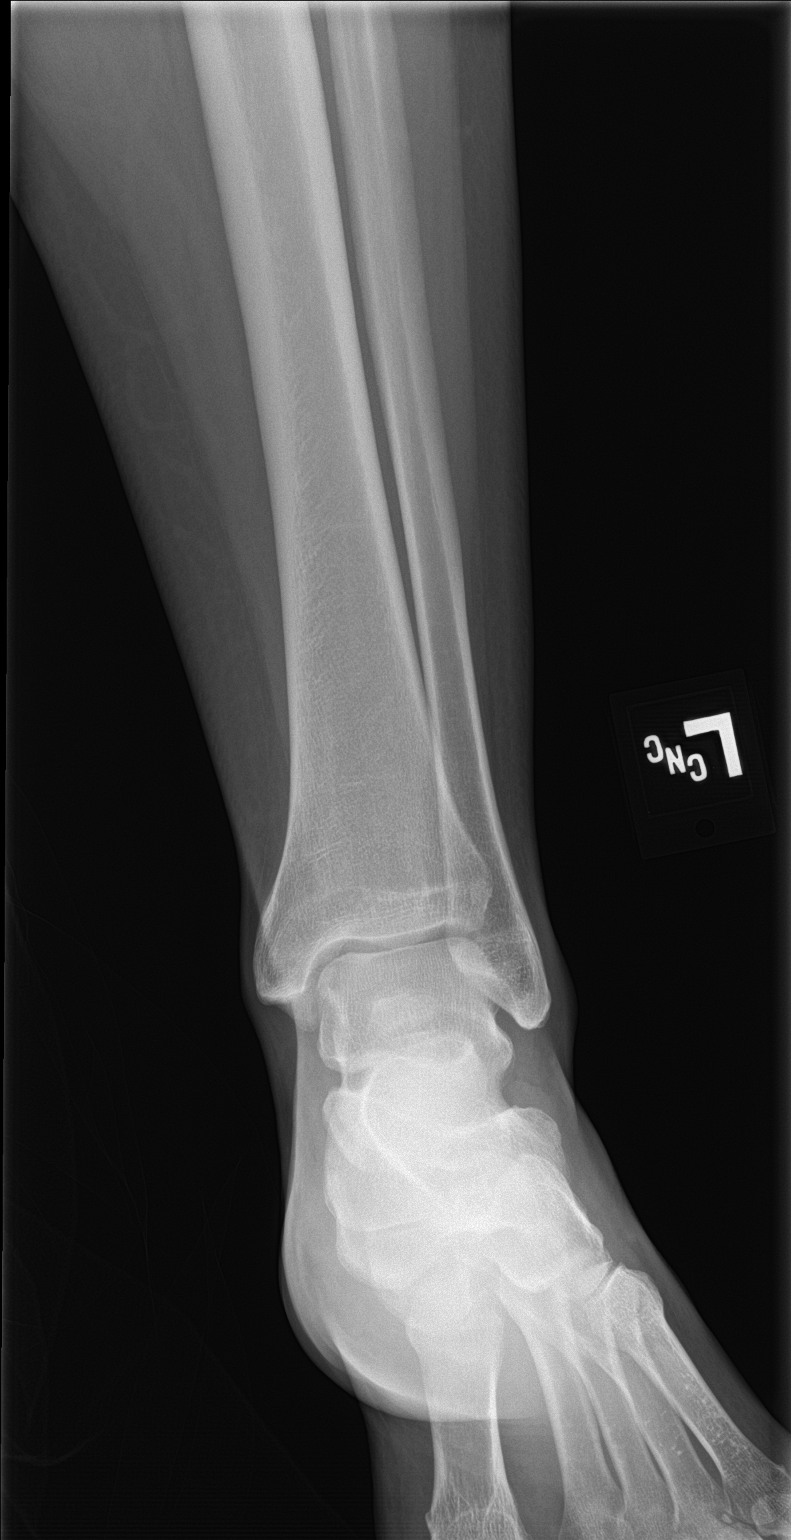

[ankle obl]
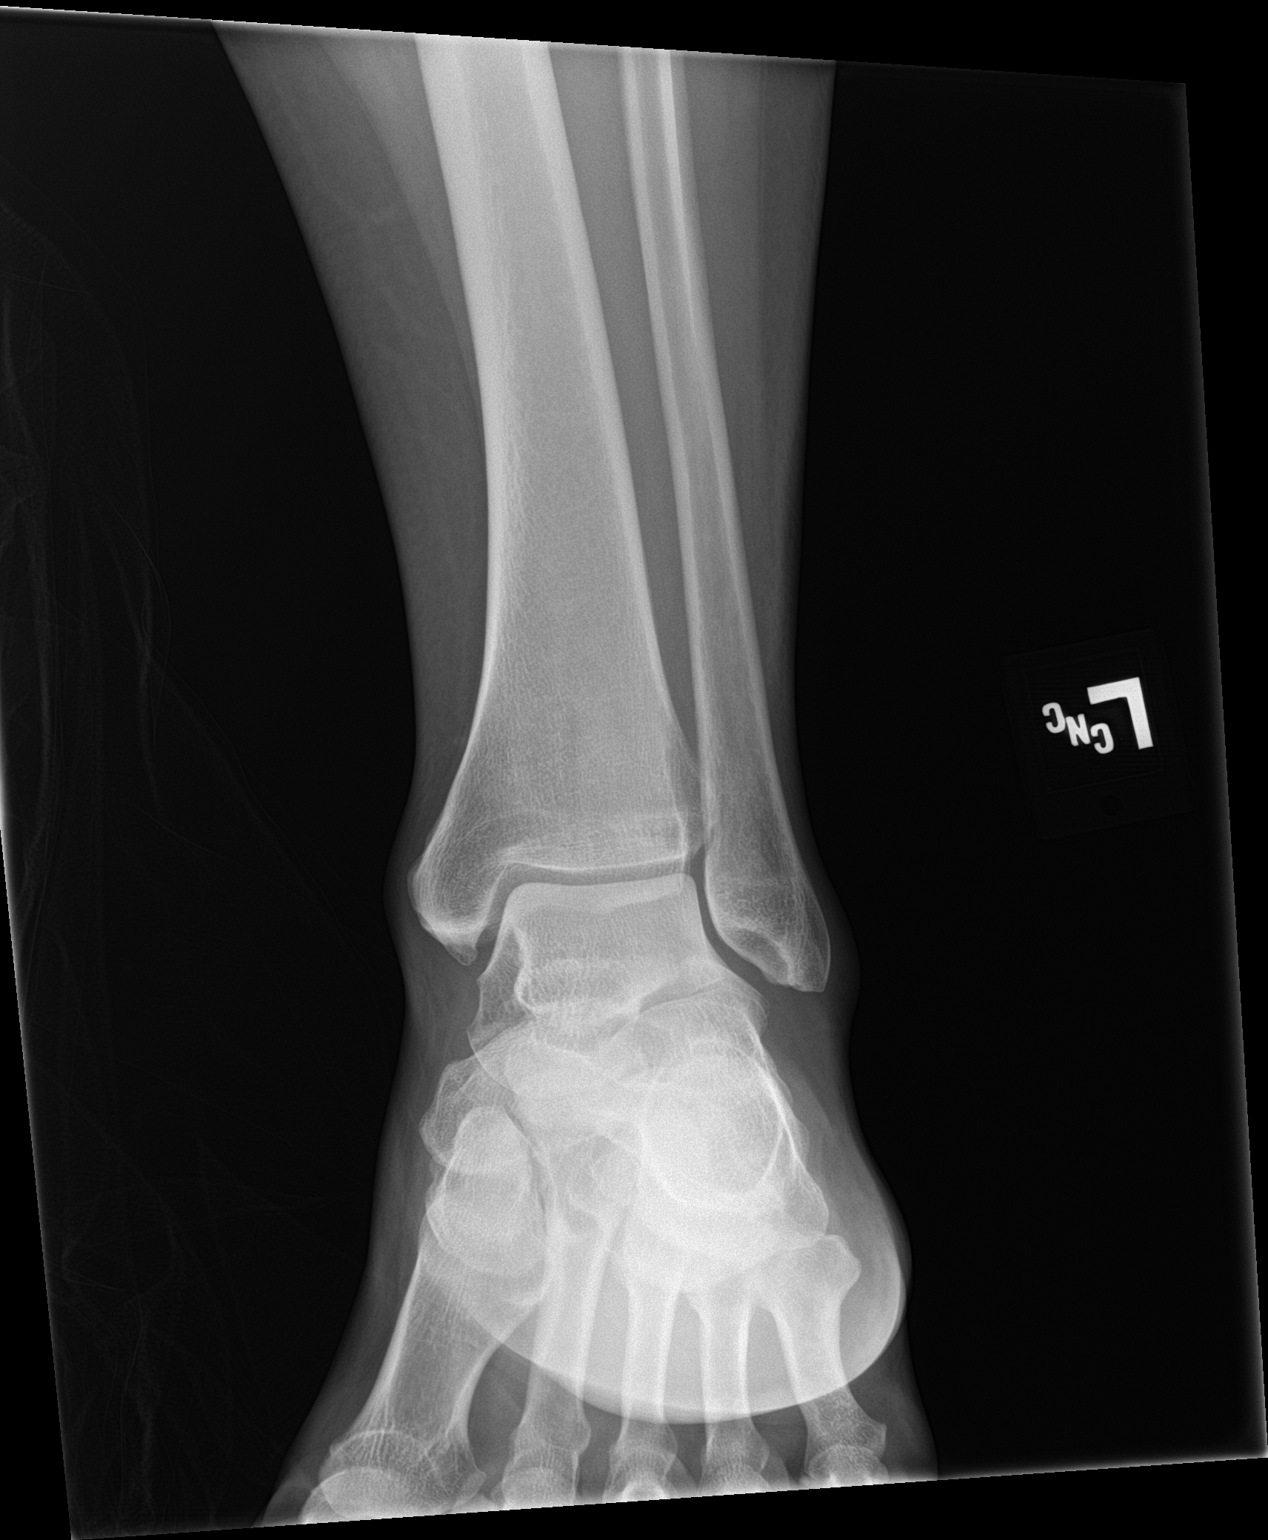

[ankle lat]
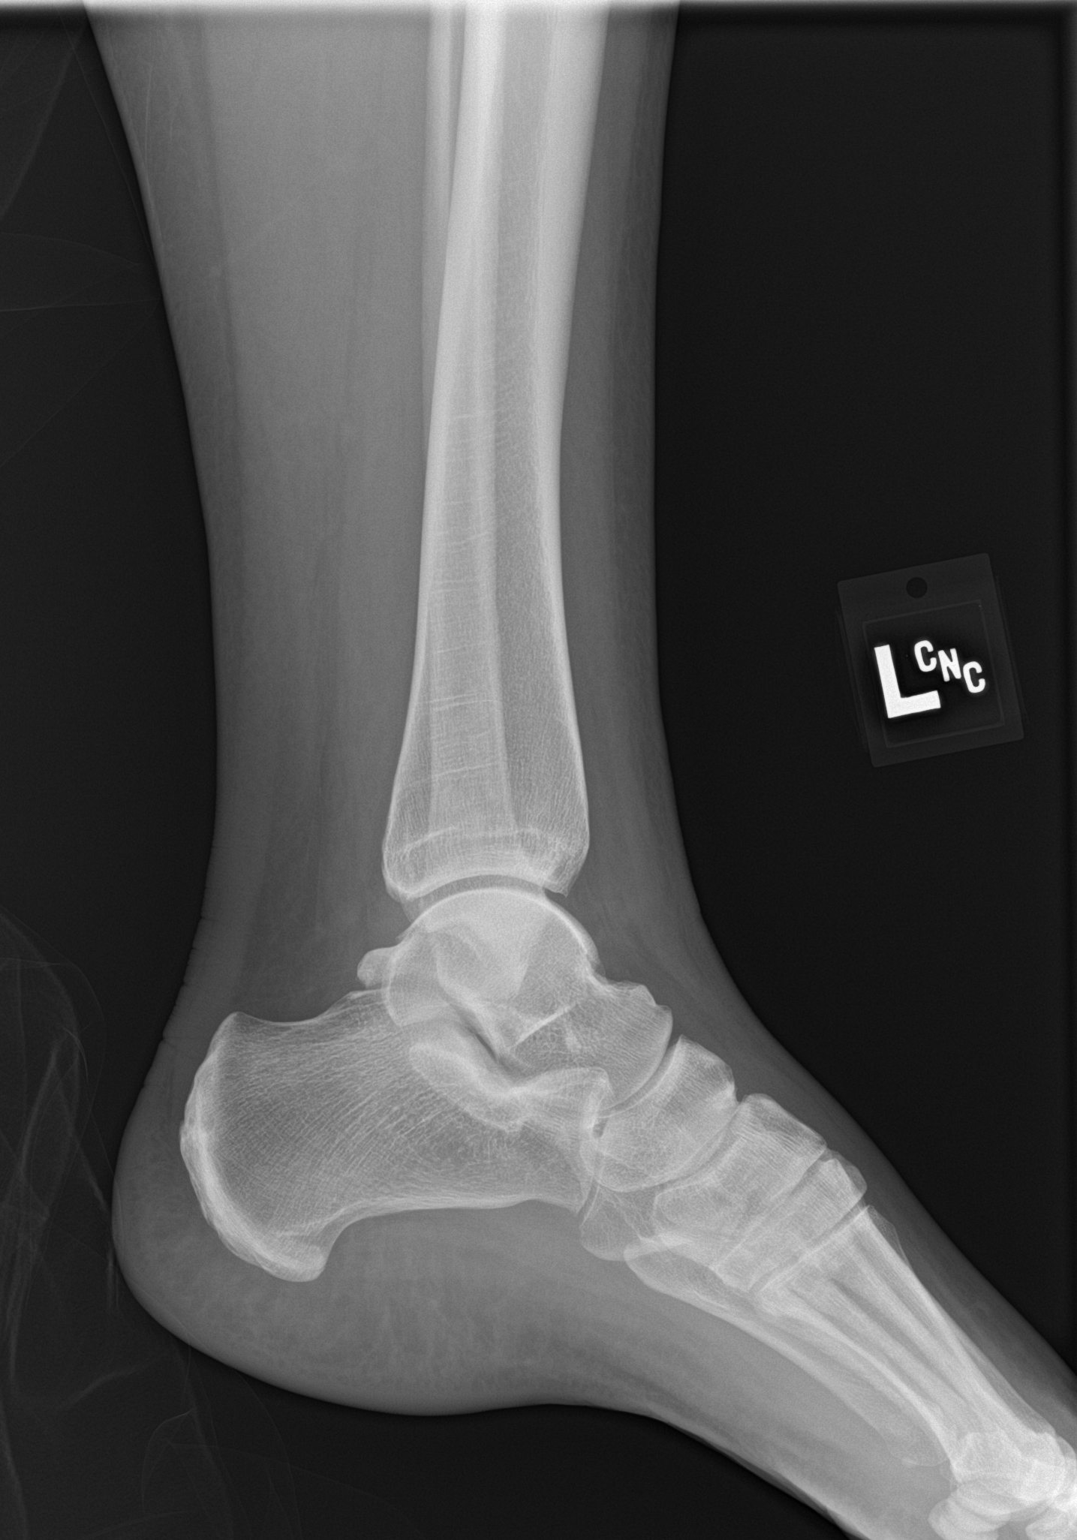

[3 of 3 positions shown; findings below may reference images not displayed]

FINDINGS: There is no evidence of fracture, dislocation, or joint effusion.
There is no evidence of arthropathy or other focal bone abnormality.
Soft tissues are unremarkable.
IMPRESSION: Negative.

## 2020-04-01 IMAGING — MR MR CERVICAL SPINE W/ CM
4 series · 24 of 48 positions shown · IV contrast (6ml Gadavist)
Comparison: MRI cervical spine without contrast [DATE].

CLINICAL DATA: Severe neck pain in a patient with a history of IV
drug abuse and discitis and osteomyelitis.

EXAM:
MRI CERVICAL SPINE WITH CONTRAST
TECHNIQUE: Multiplanar, multisequence MR imaging of the cervical spine was
performed following the administration of intravenous contrast.

[Series 5: FLAIR · sagittal · 3.0mm · 0.96mm/px · 3 of 15 slices shown]
[im 3/15]
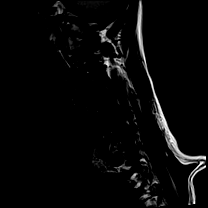
[im 9/15]
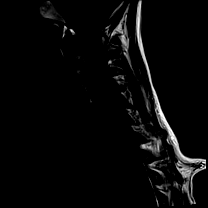
[im 13/15]
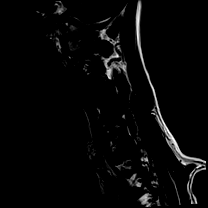

[Series 6: T1 · axial · non-contrast · 3.0mm · 0.35mm/px · z∈[-125,-35]mm · 10 of 29 slices shown]
[im 2/29]
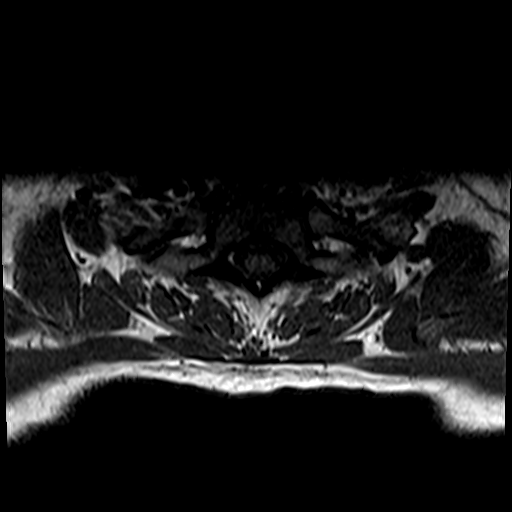
[im 4/29]
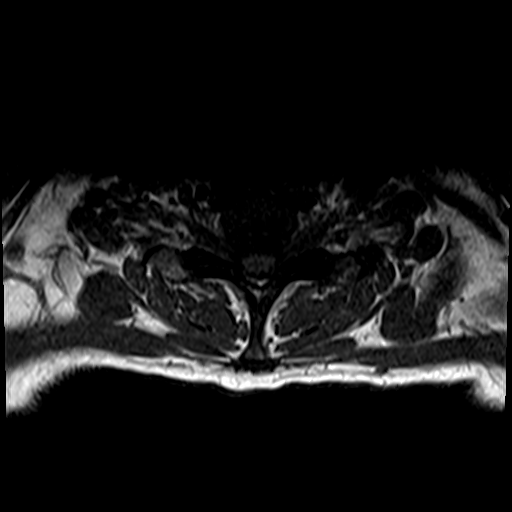
[im 6/29]
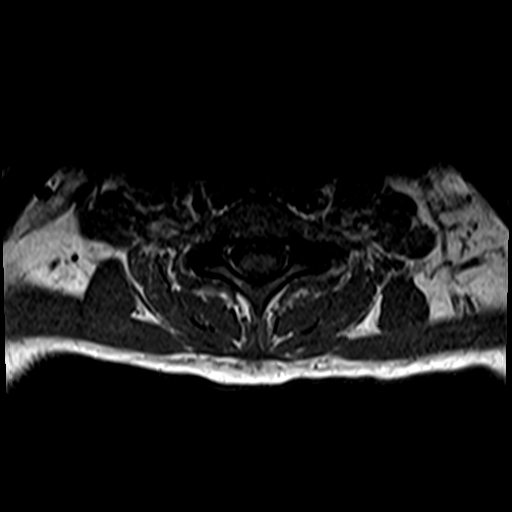
[im 10/29]
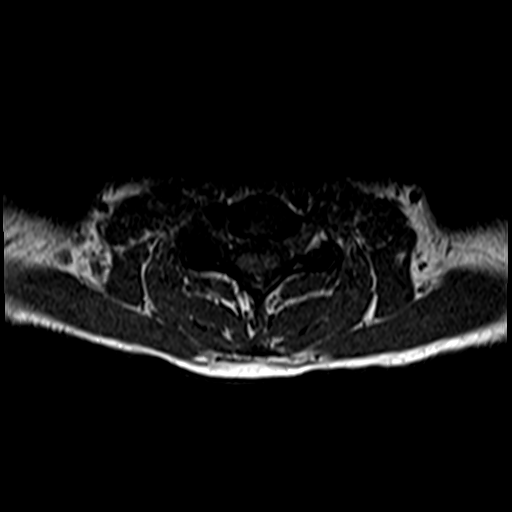
[im 14/29]
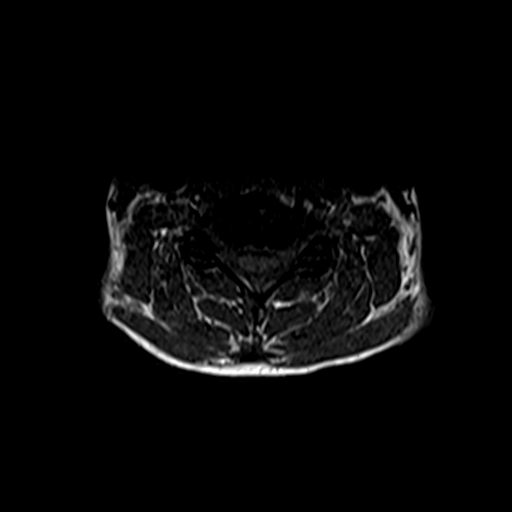
[im 15/29]
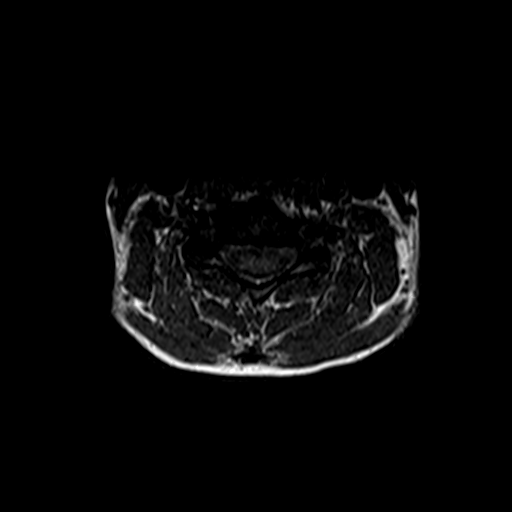
[im 17/29]
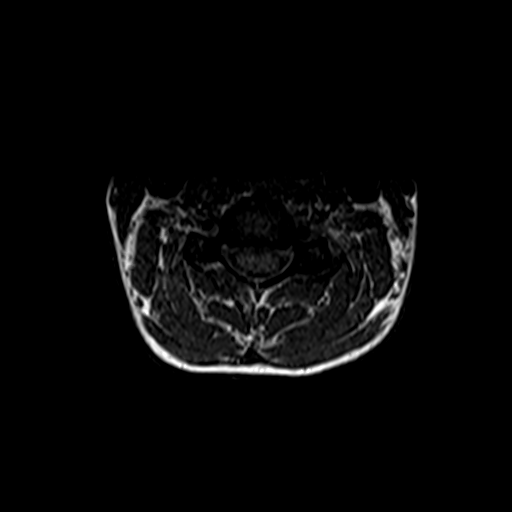
[im 21/29]
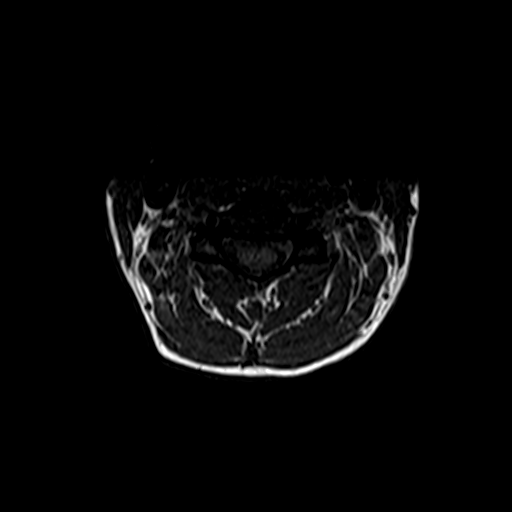
[im 25/29]
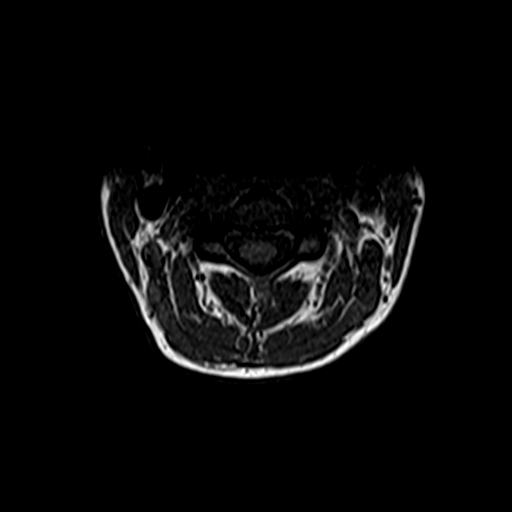
[im 29/29]
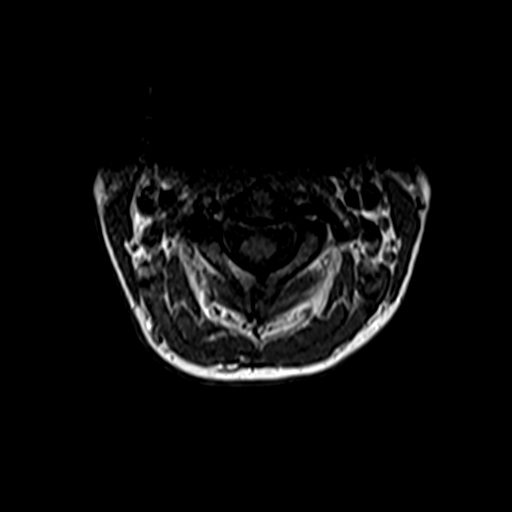

[Series 7: FLAIR fat-sat post-contrast · sagittal · 3.0mm · 0.96mm/px · 3 of 15 slices shown]
[im 3/15]
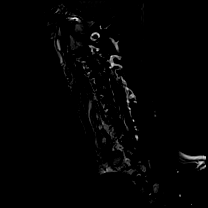
[im 9/15]
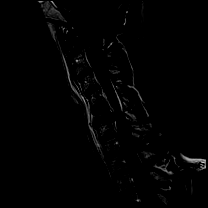
[im 13/15]
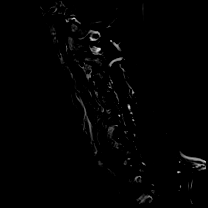

[Series 8: T1 post-contrast · axial · 3.0mm · 0.35mm/px · z∈[-125,-48]mm · 8 of 29 slices shown]
[im 2/29]
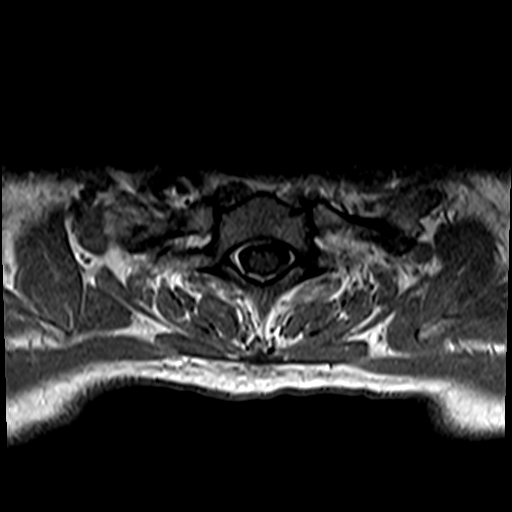
[im 4/29]
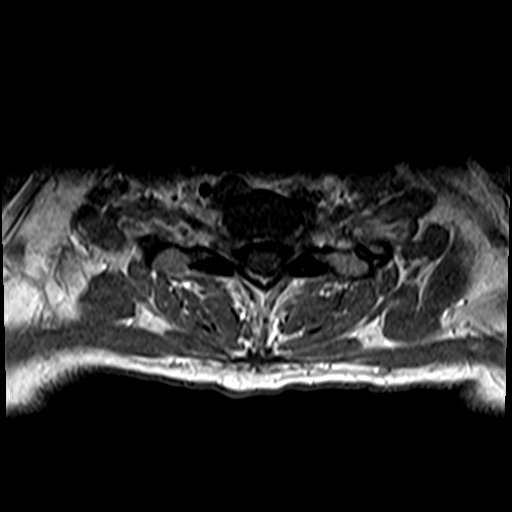
[im 6/29]
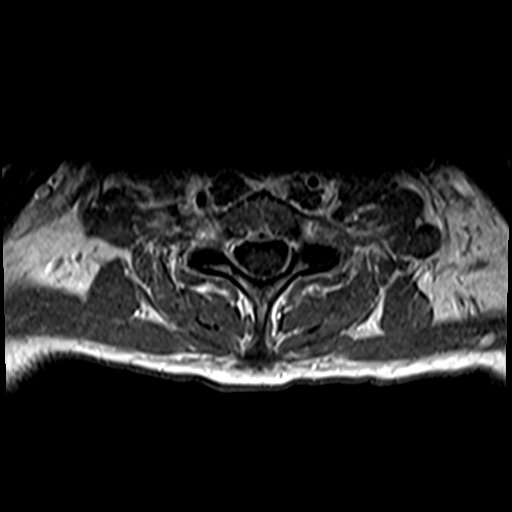
[im 10/29]
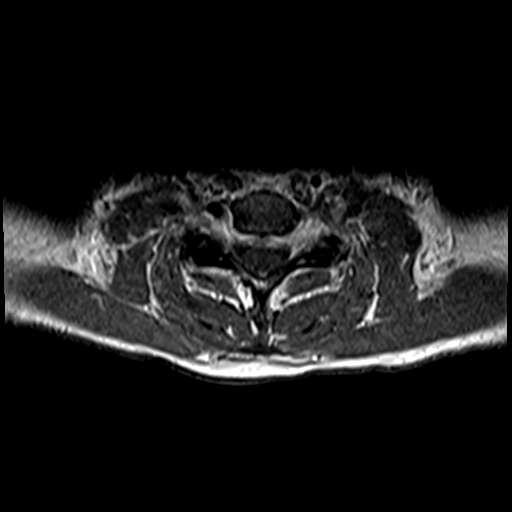
[im 14/29]
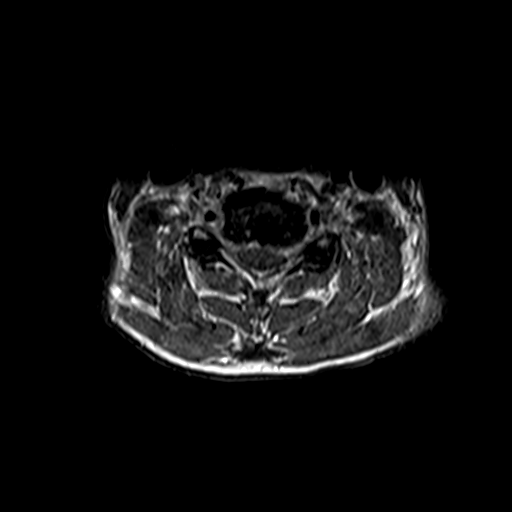
[im 15/29]
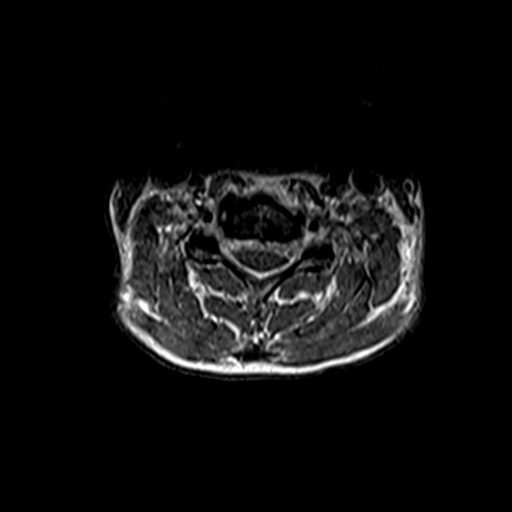
[im 17/29]
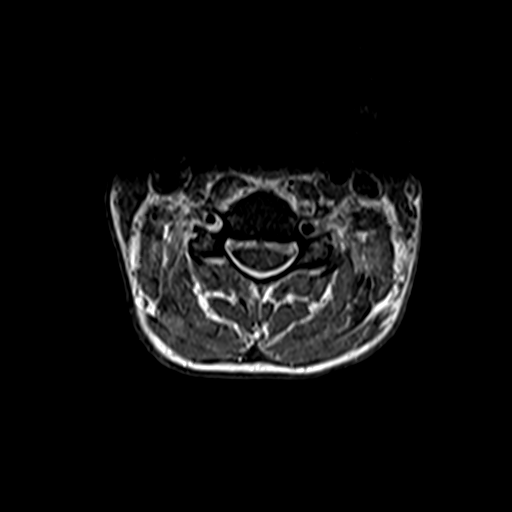
[im 25/29]
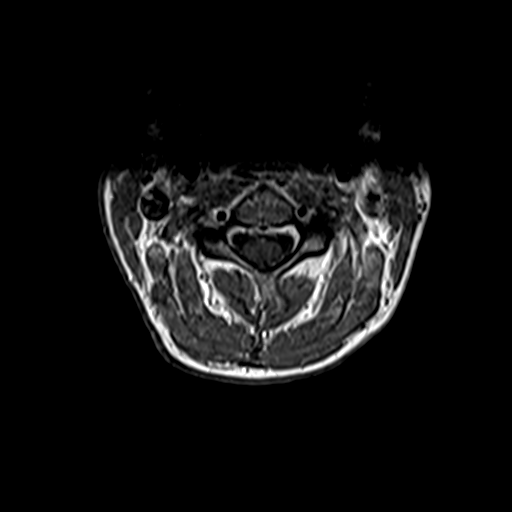

[24 of 48 positions shown; findings below may reference images not displayed]

FINDINGS: There is epidural enhancement at virtually all levels of the
cervical spine. Fluid and enhancement are also seen anterior to the
cervical spine. No abscess is identified. There is mild enhancement
in the C5-6 endplates and a punctate foci of enhancement in the disc
interspace.
IMPRESSION: Findings most consistent with early discitis and osteomyelitis at
C5-6 with epidural phlegmon and prevertebral infection. Negative for
abscess.

## 2020-04-01 MED ORDER — SODIUM CHLORIDE 0.9 % IV SOLN
2.0000 g | Freq: Three times a day (TID) | INTRAVENOUS | Status: AC
  Administered 2020-04-01 – 2020-04-16 (×45): 2 g via INTRAVENOUS
  Filled 2020-04-01: qty 1.94
  Filled 2020-04-01 (×19): qty 2
  Filled 2020-04-01: qty 1.94
  Filled 2020-04-01: qty 2
  Filled 2020-04-01: qty 1.94
  Filled 2020-04-01 (×8): qty 2
  Filled 2020-04-01: qty 1.94
  Filled 2020-04-01 (×9): qty 2
  Filled 2020-04-01: qty 1.94
  Filled 2020-04-01: qty 2
  Filled 2020-04-01: qty 1.94
  Filled 2020-04-01 (×5): qty 2
  Filled 2020-04-01: qty 1.94
  Filled 2020-04-01 (×4): qty 2

## 2020-04-01 MED ORDER — METRONIDAZOLE IN NACL 5-0.79 MG/ML-% IV SOLN
500.0000 mg | Freq: Three times a day (TID) | INTRAVENOUS | Status: DC
  Administered 2020-04-01 – 2020-04-02 (×5): 500 mg via INTRAVENOUS
  Filled 2020-04-01 (×8): qty 100

## 2020-04-01 MED ORDER — ACETAMINOPHEN 325 MG PO TABS
650.0000 mg | ORAL_TABLET | Freq: Four times a day (QID) | ORAL | Status: DC | PRN
  Administered 2020-04-01 – 2020-04-11 (×6): 650 mg via ORAL
  Filled 2020-04-01 (×6): qty 2

## 2020-04-01 MED ORDER — LORAZEPAM 1 MG PO TABS: 1.0000 mg | ORAL_TABLET | ORAL | Status: DC | PRN

## 2020-04-01 MED ORDER — ADULT MULTIVITAMIN W/MINERALS CH
1.0000 | ORAL_TABLET | Freq: Every day | ORAL | Status: DC
  Administered 2020-04-01 – 2020-04-17 (×17): 1 via ORAL
  Filled 2020-04-01 (×17): qty 1

## 2020-04-01 MED ORDER — VANCOMYCIN HCL 1750 MG/350ML IV SOLN
1750.0000 mg | INTRAVENOUS | Status: DC
  Filled 2020-04-01: qty 350

## 2020-04-01 MED ORDER — HYDROMORPHONE HCL 1 MG/ML IJ SOLN
1.0000 mg | INTRAMUSCULAR | Status: DC | PRN
  Administered 2020-04-01 – 2020-04-03 (×20): 1 mg via INTRAVENOUS
  Filled 2020-04-01 (×20): qty 1

## 2020-04-01 MED ORDER — LORAZEPAM 2 MG/ML IJ SOLN: 1.0000 mg | INTRAMUSCULAR | Status: DC | PRN

## 2020-04-01 MED ORDER — THIAMINE HCL 100 MG/ML IJ SOLN: 100.0000 mg | Freq: Every day | INTRAMUSCULAR | Status: DC

## 2020-04-01 MED ORDER — FOLIC ACID 1 MG PO TABS
1.0000 mg | ORAL_TABLET | Freq: Every day | ORAL | Status: DC
  Administered 2020-04-01 – 2020-04-17 (×16): 1 mg via ORAL
  Filled 2020-04-01 (×17): qty 1

## 2020-04-01 MED ORDER — THIAMINE HCL 100 MG PO TABS
100.0000 mg | ORAL_TABLET | Freq: Every day | ORAL | Status: DC
  Administered 2020-04-01 – 2020-04-17 (×15): 100 mg via ORAL
  Filled 2020-04-01 (×17): qty 1

## 2020-04-01 MED ORDER — VANCOMYCIN HCL 1000 MG/200ML IV SOLN
1000.0000 mg | Freq: Two times a day (BID) | INTRAVENOUS | Status: AC
  Administered 2020-04-01 – 2020-04-04 (×7): 1000 mg via INTRAVENOUS
  Filled 2020-04-01 (×12): qty 200

## 2020-04-01 MED ORDER — GADOBUTROL 1 MMOL/ML IV SOLN
6.0000 mL | Freq: Once | INTRAVENOUS | Status: AC | PRN
  Administered 2020-04-01: 6 mL via INTRAVENOUS

## 2020-04-01 MED ORDER — SODIUM CHLORIDE 0.9 % IV SOLN
2.0000 g | INTRAVENOUS | Status: DC
  Filled 2020-04-01: qty 2

## 2020-04-01 NOTE — Progress Notes (Signed)
   04/01/20 1524  Vitals  Temp (!) 101.7 F (38.7 C)  Temp Source Oral  BP 123/73  MAP (mmHg) 86  BP Location Right Arm  BP Method Automatic  Patient Position (if appropriate) Lying  Pulse Rate 97  Pulse Rate Source Monitor  Resp 18  MEWS COLOR  MEWS Score Color Yellow  Oxygen Therapy  SpO2 95 %  O2 Device Room Air  Pain Assessment  Pain Scale 0-10  Pain Score 10  Pain Type Acute pain  Pain Location Neck  Pain Orientation Right  Pain Radiating Towards back/clavicle  Pain Descriptors / Indicators Aching  Pain Frequency Constant  Pain Onset On-going  Patients Stated Pain Goal 0  Pain Intervention(s) Medication (See eMAR)  MEWS Score  MEWS Temp 2  MEWS Systolic 0  MEWS Pulse 0  MEWS RR 0  MEWS LOC 0  MEWS Score 2  Provider Notification  Provider Name/Title Irene Limbo  Date Provider Notified 04/01/20  Time Provider Notified 1536  Notification Type Page  Notification Reason Other (Comment) (elevated temp)  Provider response See new orders  Date of Provider Response 04/01/20  Time of Provider Response 1537

## 2020-04-01 NOTE — Progress Notes (Addendum)
PROGRESS NOTE  Susan Terry ZOX:[id number] DOB: 20-Sep-1975 DOA: 03/31/2020 [facility]  Brief History    44yow PMH IVDU w/ last heroin use within last week; lumbar discitis/osteomyelitis, MRSA bacteremia PRESENTED w/ new neck pain, chills and iaphoresis. Admitted for new acute discitiitis and osteo of C5-6 and neurosurgery consult.  A & P  Acute discitis, osteo of C5-6 secondary to IVDU --neurologically intact, f/u neurosurgery recommendations --neurochecks --continue empiric abx, follow-up culture data. Echo ordered. --ID consult 3/7  Pain in neck secondary to above, left ankle pain --treat aggressively w/ hydromorphone --image left ankle, but on exam skin and joint appear unremarkable  IVDU --last heroin use before admission --multiple injection sites both hands. Exam of hands benign, perfusion appears intact, motor appears intact, no pain --might be interested in quitting --consider Suboxone if willing once acute pain subsides --screen HIV   Disposition Plan:  Discussion: plan as above, no s/s of sepsis or acute decompensation. Downgrade to med surg.  Status is: Inpatient  Remains inpatient appropriate because:IV treatments appropriate due to intensity of illness or inability to take PO and Inpatient level of care appropriate due to severity of illness   Dispo: The patient is from: Home              Anticipated d/c is to: Home              Patient currently is not medically stable to d/c.   Difficult to place patient Yes  DVT prophylaxis: Place and maintain sequential compression device Start: 03/31/20 2000 SCDs Start: 03/31/20 1929   Code Status: Full Code Level of care: Med-Surg Family Communication: none  Brendia Sacks, MD  [facility] Direct contact: see [email or web address] (further directions at bottom of note if needed) 7PM-7AM contact night coverage as at bottom of note 04/01/2020, 11:00 AM  LOS: 1 day   Significant Hospital Events   .     Consults:  .    Procedures:  .   Significant Diagnostic Tests:  Marland Kitchen    Micro Data:  .    Antimicrobials:  .   Interval History/Subjective  CC: f/u neck pain  C/o severe neck pain, radiates to right paravertebral and shoulder area C/o whole body pain, severe C/o left ankle, medial malleolus pain Has used suboxone in past and interested in stopping heroin Injects only in arms No weakness, no bowel or bladder problems  Objective   Vitals:  Vitals:   04/01/20 0534 04/01/20 0828  BP: 132/74 118/61  Pulse: 81 84  Resp: 18 18  Temp: 97.9 F (36.6 C) 99.8 F (37.7 C)  SpO2: 94% 97%    Exam:  Constitutional:   . Appears uncomfortable, nontoxic ENMT:  . grossly normal hearing  Neck:  . neck appears normal, no masses, won't move . Mild pain w/ palpation over neck vertebrae and right paravertebral area Respiratory:  . CTA bilaterally, no w/r/r.  . Respiratory effort normal.   Cardiovascular:  . RRR, no m/r/g . No LE extremity edema   . Bilateral hand, lower arm edema Musculoskeletal:  . RUE, LUE, RLE, LLE   . Moves all extremities to command Skin:  . Signs of injection both arms, hands puffy Psychiatric:  . Mental status o Mood difficult to assess, affect anxious  I have personally reviewed the following:   Today's Data  . CMP, CBC noted . BC pending . MRI noted . CXR clear  Scheduled Meds: . aspirin EC  81 mg Oral Daily  . folic  acid  1 mg Oral Daily  . multivitamin with minerals  1 tablet Oral Daily  . nicotine  21 mg Transdermal Daily  . pantoprazole (PROTONIX) IV  40 mg Intravenous Q24H  . thiamine  100 mg Oral Daily   Or  . thiamine  100 mg Intravenous Daily   Continuous Infusions: . sodium chloride 100 mL/hr at 04/01/20 0500  . ceFEPime (MAXIPIME) IV 2 g (04/01/20 1018)  . metronidazole 500 mg (04/01/20 0513)  . vancomycin      Principal Problem:   Osteomyelitis of vertebra of cervical region [facility]) Active Problems:   Discitis of  cervical region   Tobacco use disorder   IVDU (intravenous drug user)   Heroin abuse (HCC)   Pain in neck   LOS: 1 day   How to contact the [facility] Attending or Consulting provider 7A - 7P or covering provider during after hours 7P -7A, for this patient?  1. Check the care team in [facility] and look for a) attending/consulting [facility] provider listed and b) the [facility] team listed 2. Log into [email or web address] and use [facility]'s universal password to access. If you do not have the password, please contact the hospital operator. 3. Locate the [facility] provider you are looking for under [facility] and page to a number that you can be directly reached. 4. If you still have difficulty reaching the provider, please page the [facility] (Director on Call) for the Hospitalists listed on amion for assistance.     '

## 2020-04-01 NOTE — Hospital Course (Addendum)
44yow PMH IVDU w/ last heroin use within last week; lumbar discitis/osteomyelitis, MRSA bacteremia PRESENTED w/ new neck pain, chills and iaphoresis. Admitted for new acute discitiitis and osteo of C5-6 and neurosurgery consult. Medical management recommended. Echo unrevealing. Continue abx as per infectious disease.  Neurosurgery planning to obtain aspiration.  A & P  Acute discitis, osteo of C5-6 secondary to IVDU --Continues to have pain, continue analgesia.  Appreciate neurosurgery recommendations.  Further imaging confirms discitis and osteomyelitis.  Echocardiogram unremarkable. --ID consultation pending for today  Mild cellulitis right hand, bilateral hands appear improved today with no exudate noted.  No abscess or fluctuance noted.  No pain reported by patient.  Pain in neck secondary to above --Improved control with increase in Dilaudid.  Added oxycodone for moderate pain.  Left ankle pain resolved.  IVDU --last heroin use just before admission --Interested in quitting --After about a week of hospitalization when inflammation has decreased with antibiotics, transition to Suboxone --screen HIV

## 2020-04-01 NOTE — Consult Note (Signed)
Referring Physician:  No referring provider defined for this encounter.  Primary Physician:  [facility]  Chief Complaint:  Neck pain  History of Present Illness: 04/01/2020 Susan Terry is a 45 y.o. female who presents with the chief complaint of severe neck pain over the past few days.  All movements make her pain worse.  She denies any new neurologic symptoms, weakness, numbness, imbalance, or any other new issues with function of her arms or legs.  She is using IV heroin, last within the last week.  She had a draining wound on her R arm recently, and was concerned that she had an infection.  Review of Systems:  A 10 point review of systems is negative, except for the pertinent positives and negatives detailed in the HPI.  Past Medical History: Past Medical History:  Diagnosis Date  . Drug abuse (HCC)   . Osteomyelitis [facility])     Past Surgical History: Past Surgical History:  Procedure Laterality Date  . BACK SURGERY    . INCISION AND DRAINAGE FOREARM / WRIST DEEP Right     Allergies: Allergies as of 03/31/2020  . (No Known Allergies)    Medications:  Current Facility-Administered Medications:  .  0.9 %  sodium chloride infusion, , Intravenous, Continuous, Gertha Calkin, MD, Last Rate: 100 mL/hr at 04/01/20 0500, New Bag at 04/01/20 0500 .  aspirin EC tablet 81 mg, 81 mg, Oral, Daily, Irena Cords V, MD, 81 mg at 04/01/20 1009 .  ceFEPIme (MAXIPIME) 2 g in sodium chloride 0.9 % 100 mL IVPB, 2 g, Intravenous, Q8H, Lowella Bandy, RPH, Last Rate: 200 mL/hr at 04/01/20 1018, 2 g at 04/01/20 1018 .  folic acid (FOLVITE) tablet 1 mg, 1 mg, Oral, Daily, Standley Brooking, MD, 1 mg at 04/01/20 1009 .  HYDROmorphone (DILAUDID) injection 1 mg, 1 mg, Intravenous, Q2H PRN, Standley Brooking, MD, 1 mg at 04/01/20 1008 .  metroNIDAZOLE (FLAGYL) IVPB 500 mg, 500 mg, Intravenous, Q8H, Irena Cords V, MD, Last Rate: 100 mL/hr at 04/01/20 0513, 500 mg at 04/01/20 0513 .   multivitamin with minerals tablet 1 tablet, 1 tablet, Oral, Daily, Standley Brooking, MD, 1 tablet at 04/01/20 1009 .  nicotine (NICODERM CQ - dosed in mg/24 hours) patch 21 mg, 21 mg, Transdermal, Daily, Patel, Ekta V, MD .  ondansetron (ZOFRAN) tablet 4 mg, 4 mg, Oral, Q6H PRN **OR** ondansetron (ZOFRAN) injection 4 mg, 4 mg, Intravenous, Q6H PRN, Gertha Calkin, MD, 4 mg at 04/01/20 1014 .  pantoprazole (PROTONIX) injection 40 mg, 40 mg, Intravenous, Q24H, Irena Cords V, MD .  thiamine tablet 100 mg, 100 mg, Oral, Daily, 100 mg at 04/01/20 1009 **OR** thiamine (B-1) injection 100 mg, 100 mg, Intravenous, Daily, Standley Brooking, MD .  vancomycin (VANCOREADY) IVPB 1000 mg/200 mL, 1,000 mg, Intravenous, Q12H, Lowella Bandy, RPH   Social History: Social History   Tobacco Use  . Smoking status: Current Every Day Smoker    Packs/day: 0.50    Types: Cigarettes  . Smokeless tobacco: Never Used  Vaping Use  . Vaping Use: Former  Substance Use Topics  . Alcohol use: No  . Drug use: Not Currently    Types: Methamphetamines, IV    Family Medical History: Family History  Problem Relation Age of Onset  . Cancer Mother        cancer throughout family    Physical Examination: Vitals:   04/01/20 0534 04/01/20 0828  BP: 132/74 118/61  Pulse: 81 84  Resp: 18 18  Temp: 97.9 F (36.6 C) 99.8 F (37.7 C)  SpO2: 94% 97%     General: Patient is well developed, well nourished, calm, collected, and in pain. She is guarding her neck.  Psychiatric: Patient is non-anxious.  Head:  Pupils equal, round, and reactive to light.  ENT:  Oral mucosa appears well hydrated.  Neck:   She will not move her neck.   Respiratory: Patient is breathing without any difficulty.  Extremities: Edema in R hand, which is also warm.  She has some healed wounds on R arm.   Vascular: Palpable pulses in dorsal pedal vessels.  Skin:   On exposed skin, there are many abnormal skin lesions consistent with  IVDU.  NEUROLOGICAL:  General: In no acute distress.   Awake, alert, oriented to person, place, and time.  Pupils equal round and reactive to light.  Facial tone is symmetric.  Tongue protrusion is midline.   ROM of spine: guarding - will not move neck.   Strength: Side Biceps Triceps Deltoid Interossei Grip Wrist Ext. Wrist Flex.  R 5 5 5 5 5 5 5   L 5 5 5 5 5 5 5    Side Iliopsoas Quads Hamstring PF DF EHL  R 5 5 5 5 5 5   L 5 5 5 5 5 5     Bilateral upper and lower extremity sensation is intact to light touch. Reflexes are 1+ and symmetric at the biceps, triceps, brachioradialis, patella and achilles. Hoffman's is absent.  Clonus is not present.  Toes are down-going.    Gait is untested due to pain.   Imaging: MRI C spine 03/31/2020 IMPRESSION: Abnormal appearance of the C5-6 level is most consistent with the patient's prior history of discitis and osteomyelitis. There is mild edema in the endplates and some prevertebral fluid worrisome for active infection. Postcontrast imaging of the cervical spine is recommended for further evaluation.  Mild kyphosis centered about the C5-6 level and a broad-based disc bulge result in flattening of the cord.   Electronically Signed   By: M.D.   On: 03/31/2020 16:48 I have personally reviewed the images and agree with the above interpretation.  Labs: CBC Latest Ref Rng & Units 03/31/2020 11/22/2018 04/05/2017  WBC 4.0 - 10.5 K/uL 6.7 12.0(H) 8.2  Hemoglobin 12.0 - 15.0 g/dL 05/31/2020 11.4(L) 12.7  Hematocrit 36.0 - 46.0 % 39.9 36.1 38.7  Platelets 150 - 400 K/uL 137(L) 223 187       Assessment and Plan: [name] is a pleasant 45 y.o. 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 She does not need acute surgery.  - C spine CT - C spine MRI with contrast - ID consult for recommendations - follow up cultures.  - Cerv spine collar for comfort (I ordered)  We will continue to follow.   Chester K.  06/05/2017 MD, MPHS Dept. of Neurosurgery

## 2020-04-01 NOTE — Progress Notes (Signed)
Pt states she needs something for withdrawal, she last used heroin 2 days ago. Now with a headache and nausea. Offered Zofran for nausea, pt refused. MD notified, will come and assess the pt. Will continue to monitor.

## 2020-04-01 NOTE — Progress Notes (Signed)
Pharmacy Antibiotic Note  Susan Terry is a 45 y.o. female admitted on 03/31/2020 with osteomyelitis.  Pharmacy has been consulted for Vancomycin  dosing.  Plan: Vancomycin 1500 mg IV X 1 given in ED on 3/5 @ 2148. Vancomycin 1750 mg IV Q24H ordered to continue on 3/6 @ 2200.  AUC = 488.6 Vanc trough = 8.1   Height: 5\' 7"  (170.2 cm) Weight: 63.5 kg (140 lb) IBW/kg (Calculated) : 61.6  Temp (24hrs), Avg:98.2 F (36.8 C), Min:98 F (36.7 C), Max:98.3 F (36.8 C)  Recent Labs  Lab 03/31/20 1356  WBC 6.7  CREATININE 0.54  LATICACIDVEN 1.0    Estimated Creatinine Clearance: 89.1 mL/min (by C-G formula based on SCr of 0.54 mg/dL).    No Known Allergies  Antimicrobials this admission:   >>    >>   Dose adjustments this admission:   Microbiology results:  BCx:   UCx:    Sputum:    MRSA PCR:   Thank you for allowing pharmacy to be a part of this patient's care.  Felita Bump D 04/01/2020 12:57 AM

## 2020-04-01 NOTE — Progress Notes (Signed)
Pharmacy Antibiotic Note  Susan Terry is a 45 y.o. 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 Pharmacy has been consulted for vancomycin dosing.  Plan:  1) adjust cefepime to 2 grams IV every 8 hours  2) adjust vancomycin dose to 1000 mg IV Q 12 hrs  Goal AUC 400-550  Expected AUC: 558.4  SCr used: 0.8 mg/dL (rounded up)  Ke: 1.324 h-1, T1/2: 8.8h  Daily renal function assessment while on IV vancomycin  Levels as clinically indicated  Height: 5\' 7"  (170.2 cm) Weight: 63.5 kg (140 lb) IBW/kg (Calculated) : 61.6  Temp (24hrs), Avg:98.1 F (36.7 C), Min:97.9 F (36.6 C), Max:98.3 F (36.8 C)  Recent Labs  Lab 03/31/20 1356  WBC 6.7  CREATININE 0.54  LATICACIDVEN 1.0    Estimated Creatinine Clearance: 89.1 mL/min (by C-G formula based on SCr of 0.54 mg/dL).    No Known Allergies  Antimicrobials this admission: 03/05 cefepime >>  03/05 metronidazole >> 03/05 vancomycin >>   Microbiology results: 03/06 BCx: pending 03/05 UCx: pending 03/05 SARS CoV-2: negative 03/05 influenza A/B: negative    Thank you for allowing pharmacy to be a part of this patient's care.  05/05 04/01/2020 8:02 AM

## 2020-04-01 NOTE — Progress Notes (Signed)
Refused morning labs. Also, pt is requesting that she needs to be on CIWA protocol because she's "withdrawing".  Attending provider notified.

## 2020-04-02 ENCOUNTER — Inpatient Hospital Stay (HOSPITAL_COMMUNITY)
Admit: 2020-04-02 | Discharge: 2020-04-02 | Disposition: A | Discharge status: Home / Self Care | Payer: Self-pay | Attending: Internal Medicine | Admitting: Internal Medicine

## 2020-04-02 DIAGNOSIS — M4642 Discitis, unspecified, cervical region: Secondary | ICD-10-CM

## 2020-04-02 DIAGNOSIS — R5081 Fever presenting with conditions classified elsewhere: Secondary | ICD-10-CM

## 2020-04-02 DIAGNOSIS — F1990 Other psychoactive substance use, unspecified, uncomplicated: Secondary | ICD-10-CM

## 2020-04-02 DIAGNOSIS — M4622 Osteomyelitis of vertebra, cervical region: Principal | ICD-10-CM

## 2020-04-02 LAB — ECHOCARDIOGRAM COMPLETE
AR max vel: 2 cm2
AV Area VTI: 1.87 cm2
AV Area mean vel: 1.93 cm2
AV Mean grad: 4 mmHg
AV Peak grad: 6.6 mmHg
Ao pk vel: 1.28 m/s
Area-P 1/2: 6.6 cm2
Height: 67 in
MV VTI: 3.39 cm2
S' Lateral: 3.2 cm
Weight: 2392 oz

## 2020-04-02 LAB — C-REACTIVE PROTEIN: CRP: 19.6 mg/dL — ABNORMAL HIGH (ref <1.0)

## 2020-04-02 LAB — CREATININE, SERUM
Creatinine, Ser: 0.54 mg/dL (ref 0.44–1.00)
GFR, Estimated: >60 mL/min (ref >60)

## 2020-04-02 LAB — HIV ANTIBODY (ROUTINE TESTING W REFLEX): HIV Screen 4th Generation wRfx: NONREACTIVE

## 2020-04-02 LAB — MRSA PCR SCREENING: MRSA by PCR: NEGATIVE

## 2020-04-02 MED ORDER — LORAZEPAM 0.5 MG PO TABS
0.5000 mg | ORAL_TABLET | Freq: Three times a day (TID) | ORAL | Status: DC | PRN
  Administered 2020-04-02 – 2020-04-17 (×34): 0.5 mg via ORAL
  Filled 2020-04-02 (×34): qty 1

## 2020-04-02 NOTE — Progress Notes (Signed)
PROGRESS NOTE  Susan Terry WNU:[id number] DOB: 09-Dec-1975 DOA: 03/31/2020 PCP: [facility]  Brief History    44yow PMH IVDU w/ last heroin use within last week; lumbar discitis/osteomyelitis, MRSA bacteremia PRESENTED w/ new neck pain, chills and iaphoresis. Admitted for new acute discitiitis and osteo of C5-6 and neurosurgery consult. Medical management recommended. Echo unrevealing. Continue abx and f/u ID consult.  A & P  Acute discitis, osteo of C5-6 secondary to IVDU --Continues to have pain, continue analgesia.  Appreciate neurosurgery recommendations.  Further imaging confirms discitis and osteomyelitis.  Echocardiogram unremarkable. --ID consultation pending for today  Mild cellulitis right hand, appears to be resolving.  No fluctuance or abscess noted.  Pain in neck secondary to above, left ankle pain --Continue to treat treat aggressively w/ hydromorphone --image left ankle unremarkable  IVDU --last heroin use just before admission --might be interested in quitting --Suboxone if willing once acute pain subsides --screen HIV   Disposition Plan:  Discussion: Continue antibiotics, follow-up ID consultation.  Status is: Inpatient  Remains inpatient appropriate because:IV treatments appropriate due to intensity of illness or inability to take PO and Inpatient level of care appropriate due to severity of illness   Dispo: The patient is from: Home              Anticipated d/c is to: Home              Patient currently is not medically stable to d/c.   Difficult to place patient Yes  DVT prophylaxis: Place and maintain sequential compression device Start: 03/31/20 2000 SCDs Start: 03/31/20 1929   Code Status: Full Code Level of care: Med-Surg Family Communication: none  Brendia Sacks, MD  [facility] Direct contact: see [email or web address] (further directions at bottom of note if needed) 7PM-7AM contact night coverage as at bottom of note 04/02/2020, 12:13 PM   LOS: 2 days   Significant Hospital Events   . 3/5 admit for cervical osteo and discitis   Consults:  . Neurosurgery . ID   Procedures:  .   Significant Diagnostic Tests:  Marland Kitchen    Micro Data:  .    Antimicrobials:  .   Interval History/Subjective  CC: f/u neck pain  C/o anxiety, pain in neck  Objective   Vitals:  Vitals:   04/02/20 0528 04/02/20 0944  BP: 125/79 112/68  Pulse: 82 95  Resp:  19  Temp: 98.7 F (37.1 C) 100.2 F (37.9 C)  SpO2: 98% 96%    Exam:  Constitutional:   . Appears calm and comfortable, resting, sleeping ENMT:  . grossly normal hearing  Respiratory:  . CTA bilaterally, no w/r/r.  . Respiratory effort normal. Cardiovascular:  . RRR, no m/r/g . Mild bilateral hand edema improved Skin:  . Swelling both hands diminished, perfusion appears intact, nontender, no fluctuance Psychiatric:  . Mental status o Mood, affect appropriate  I have personally reviewed the following:   Today's Data  . CT cervical spine and MRI cervical spine noted . Echo noted  Scheduled Meds: . aspirin EC  81 mg Oral Daily  . folic acid  1 mg Oral Daily  . multivitamin with minerals  1 tablet Oral Daily  . nicotine  21 mg Transdermal Daily  . pantoprazole (PROTONIX) IV  40 mg Intravenous Q24H  . thiamine  100 mg Oral Daily   Or  . thiamine  100 mg Intravenous Daily   Continuous Infusions: . sodium chloride 100 mL/hr at 04/01/20 0500  . ceFEPime (MAXIPIME) IV  2 g (04/02/20 0525)  . metronidazole 500 mg (04/02/20 0310)  . vancomycin 1,000 mg (04/02/20 1144)    Principal Problem:   Osteomyelitis of vertebra of cervical region [facility]) Active Problems:   Discitis of cervical region   Tobacco use disorder   IVDU (intravenous drug user)   Heroin abuse (HCC)   Pain in neck   LOS: 2 days   How to contact the [facility] Attending or Consulting provider 7A - 7P or covering provider during after hours 7P -7A, for this patient?  1. Check the care team in [facility] and  look for a) attending/consulting [facility] provider listed and b) the [facility] team listed 2. Log into [email or web address] and use [place]'s universal password to access. If you do not have the password, please contact the hospital operator. 3. Locate the [facility] provider you are looking for under [facility] and page to a number that you can be directly reached. 4. If you still have difficulty reaching the provider, please page the [facility] (Director on Call) for the Hospitalists listed on amion for assistance.     '

## 2020-04-02 NOTE — Consult Note (Signed)
NAME: Susan Terry  DOB: November 29, 1975  MRN: [id number]  Date/Time: 04/02/2020 3:22 PM  REQUESTING PROVIDER: Dr.Goodrich Subjective:  REASON FOR CONSULT: cervical osteomyelitis ?Chart reviewed.  Patient is lying curled up in bed.  Reluctant historian Susan Terry is a 45 y.o. 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  She thought she had an infection.  Patient uses IV heroin. In the ED  BP 100/86, temperature 98, pulse 96, respiratory rate 17, sats 96%.  .  From the ED only 1 set of blood culture was sent.  WBC 6.7, Hb 12.8, platelet 137.  Creatinine 0.54, albumin 3.3.  CT cervical spine revealed prominent prevertebral fluid extending from C5-C6 to the anterior arch of C1.  The fluid measured approximately 7 cm craniocaudal into 0.8 cm into 3 cm transverse. MRI confirmed Terry discitis and osteomyelitis at C5-C6 with epidural phlegmon and prevertebral infection as well.  She was started on vancomycin, cefepime and metronidazole. Patient developed a fever of 101.7 yesterday.  Past Medical History:  Diagnosis Date  . Drug abuse (HCC)   . Osteomyelitis [facility])     Past Surgical History:  Procedure Laterality Date  . BACK SURGERY    . INCISION AND DRAINAGE FOREARM / WRIST DEEP Right     Social History   Socioeconomic History  . Marital status: Single    Spouse name: Not on file  . Number of children: Not on file  . Years of education: Not on file  . Highest education level: Not on file  Occupational History  . Not on file  Tobacco Use  . Smoking status: Current Every Day Smoker    Packs/day: 0.50    Types: Cigarettes  . Smokeless tobacco: Never Used  Vaping Use  . Vaping Use: Former  Substance  and Sexual Activity  . Alcohol use: No  . Drug use: Not Currently    Types: Methamphetamines, IV  . Sexual activity: Never  Other Topics Concern  . Not on file  Social History Narrative  . Not on file   Social Determinants of Health   Financial Resource Strain: Not on file  Food Insecurity: Not on file  Transportation Needs: Not on file  Physical Activity: Not on file  Stress: Not on file  Social Connections: Not on file  Intimate Partner Violence: Not on file    Family History  Problem Relation Age of Onset  . Cancer Mother        cancer throughout family   No Known Allergies I? Current Facility-Administered Medications  Medication Dose Route Frequency Provider Last Rate Last Admin  . 0.9 %  sodium chloride infusion   Intravenous Continuous Gertha Calkin, MD 100 mL/hr at 04/01/20 0500 New Bag at 04/01/20 0500  . acetaminophen (TYLENOL) tablet 650 mg  650 mg Oral Q6H PRN Standley Brooking, MD   650 mg at 04/01/20 1541  . aspirin EC tablet 81 mg  81 mg Oral Daily Gertha Calkin, MD   81 mg at 04/01/20 1009  . ceFEPIme (MAXIPIME) 2 g in sodium chloride 0.9 % 100 mL IVPB  2 g Intravenous Q8H Lowella Bandy, RPH 200 mL/hr at 04/02/20 1417 2 g at 04/02/20 1417  .  folic acid (FOLVITE) tablet 1 mg  1 mg Oral Daily Standley Brooking, MD   1 mg at 04/01/20 1009  . HYDROmorphone (DILAUDID) injection 1 mg  1 mg Intravenous Q2H PRN Standley Brooking, MD   1 mg at 04/02/20 1415  . LORazepam (ATIVAN) tablet 0.5 mg  0.5 mg Oral Q8H PRN Standley Brooking, MD   0.5 mg at 04/02/20 1139  . metroNIDAZOLE (FLAGYL) IVPB 500 mg  500 mg Intravenous Q8H Irena Cords V, MD 100 mL/hr at 04/02/20 1251 500 mg at 04/02/20 1251  . multivitamin with minerals tablet 1 tablet  1 tablet Oral Daily Standley Brooking, MD   1 tablet at 04/01/20 1009  . nicotine (NICODERM CQ - dosed in mg/24 hours) patch 21 mg  21 mg Transdermal Daily Irena Cords V, MD      . ondansetron (ZOFRAN) tablet 4 mg  4 mg Oral Q6H PRN  Gertha Calkin, MD       Or  . ondansetron (ZOFRAN) injection 4 mg  4 mg Intravenous Q6H PRN Gertha Calkin, MD   4 mg at 04/01/20 1014  . pantoprazole (PROTONIX) injection 40 mg  40 mg Intravenous Q24H Gertha Calkin, MD   40 mg at 04/01/20 1954  . thiamine tablet 100 mg  100 mg Oral Daily Standley Brooking, MD   100 mg at 04/01/20 1009   Or  . thiamine (B-1) injection 100 mg  100 mg Intravenous Daily Standley Brooking, MD      . vancomycin (VANCOREADY) IVPB 1000 mg/200 mL  1,000 mg Intravenous Q12H Lowella Bandy, RPH 200 mL/hr at 04/02/20 1144 1,000 mg at 04/02/20 1144     Abtx:  Anti-infectives (From admission, onward)   Start     Dose/Rate Route Frequency Ordered Stop   04/01/20 2200  vancomycin (VANCOREADY) IVPB 1750 mg/350 mL  Status:  Discontinued        1,750 mg 175 mL/hr over 120 Minutes Intravenous Every 24 hours 04/01/20 0056 04/01/20 0904   04/01/20 1900  ceFEPIme (MAXIPIME) 2 g in sodium chloride 0.9 % 100 mL IVPB  Status:  Discontinued        2 g 200 mL/hr over 30 Minutes Intravenous Every 24 hours 04/01/20 0024 04/01/20 0803   04/01/20 1000  ceFEPIme (MAXIPIME) 2 g in sodium chloride 0.9 % 100 mL IVPB        2 g 200 mL/hr over 30 Minutes Intravenous Every 8 hours 04/01/20 0803     04/01/20 1000  vancomycin (VANCOREADY) IVPB 1000 mg/200 mL        1,000 mg 200 mL/hr over 60 Minutes Intravenous Every 12 hours 04/01/20 0904     04/01/20 0400  metroNIDAZOLE (FLAGYL) IVPB 500 mg        500 mg 100 mL/hr over 60 Minutes Intravenous Every 8 hours 04/01/20 0024     03/31/20 1900  vancomycin (VANCOREADY) IVPB 1500 mg/300 mL        1,500 mg 150 mL/hr over 120 Minutes Intravenous  Once 03/31/20 1844 04/01/20 0126   03/31/20 1845  ceFEPIme (MAXIPIME) 2 g in sodium chloride 0.9 % 100 mL IVPB        2 g 200 mL/hr over 30 Minutes Intravenous  Once 03/31/20 1835 03/31/20 2146   03/31/20 1845  metroNIDAZOLE (FLAGYL) IVPB 500 mg        500 mg 100 mL/hr over 60 Minutes Intravenous   Once 03/31/20 1835 03/31/20 2146  03/31/20 1845  vancomycin (VANCOCIN) IVPB 1000 mg/200 mL premix  Status:  Discontinued        1,000 mg 200 mL/hr over 60 Minutes Intravenous  Once 03/31/20 1835 03/31/20 1844      REVIEW OF SYSTEMS:  Const:  fever, chills, negative weight loss Eyes: negative diplopia or visual changes, negative eye pain ENT: negative coryza, negative sore throat Resp: negative cough, hemoptysis, dyspnea Cards: negative for chest pain, palpitations, lower extremity edema GU: negative for frequency, dysuria and hematuria GI: Negative for abdominal pain, diarrhea, bleeding, constipation Skin: Small abscesses the site of IVDA  heme: negative for easy bruising and gum/nose bleeding MS: Neck pain Neurolo:negative for headaches, dizziness, vertigo, memory problems  Psych: negative for feelings of anxiety, depression  Endocrine: negative for thyroid, diabetes Allergy/Immunology- negative for any medication or food allergies ?  Objective:  VITALS:  BP 112/68 (BP Location: Right Arm)   Pulse 95   Temp 100.2 F (37.9 C) (Oral)   Resp 19   Ht 5\' 7"  (1.702 m)   Wt 67.8 kg   SpO2 96%   BMI 23.42 kg/m  PHYSICAL EXAM:  General: Lying curled up to the left side.  Says her neck hurts Head: Normocephalic, without obvious abnormality, atraumatic. Eyes: Conjunctivae clear, anicteric sclerae. Pupils are equal ENT Nares normal. No drainage or sinus tenderness. Lips, mucosa, and tongue normal. No Thrush Neck: No obvious lesions Back: No CVA tenderness. Lungs: Bilateral air entry Heart: S1-S2 Abdomen: Soft, non-tender,not distended. Bowel sounds normal. No masses Extremities: atraumatic, no cyanosis. No edema. No clubbing Skin: Small abscess on the dorsum of the right hand Scab on the forearm Lymph: Cervical, supraclavicular normal. Neurologic: Grossly non-focal Pertinent Labs Lab Results CBC    Component Value Date/Time   WBC 6.7 03/31/2020 1356   RBC 4.79  03/31/2020 1356   HGB 12.8 03/31/2020 1356   HCT 39.9 03/31/2020 1356   PLT 137 (L) 03/31/2020 1356   MCV 83.3 03/31/2020 1356   MCH 26.7 03/31/2020 1356   MCHC 32.1 03/31/2020 1356   RDW 14.6 03/31/2020 1356   LYMPHSABS 0.9 03/31/2020 1356   MONOABS 0.5 03/31/2020 1356   EOSABS 0.0 03/31/2020 1356   BASOSABS 0.0 03/31/2020 1356    CMP Latest Ref Rng & Units 04/02/2020 03/31/2020 11/22/2018  Glucose 70 - 99 mg/dL - 11/24/2018) 254(Y)  BUN 6 - 20 mg/dL - 9 10  Creatinine 706(C - 1.00 mg/dL 3.76 2.83 1.51  Sodium 135 - 145 mmol/L - 135 136  Potassium 3.5 - 5.1 mmol/L - 4.2 3.7  Chloride 98 - 111 mmol/L - 104 103  CO2 22 - 32 mmol/L - 22 23  Calcium 8.9 - 10.3 mg/dL - 8.5(L) 9.3  Total Protein 6.5 - 8.1 g/dL - 7.4 7.8  Total Bilirubin 0.3 - 1.2 mg/dL - 0.7 0.7  Alkaline Phos 38 - 126 U/L - 85 79  AST 15 - 41 U/L - 39 21  ALT 0 - 44 U/L - 35 12      Microbiology: Recent Results (from the past 240 hour(s))  Resp Panel by RT-PCR (Flu A&B, Covid) Nasopharyngeal Swab     Status: None   Collection Time: 03/31/20  8:01 PM   Specimen: Nasopharyngeal Swab; Nasopharyngeal(NP) swabs in vial transport medium  Result Value Ref Range Status   SARS Coronavirus 2 by RT PCR NEGATIVE NEGATIVE Final    Comment: (NOTE) SARS-CoV-2 target nucleic acids are NOT DETECTED.  The SARS-CoV-2 RNA is generally detectable in upper respiratory  specimens during the acute phase of infection. The lowest concentration of SARS-CoV-2 viral copies this assay can detect is 138 copies/mL. A negative result does not preclude SARS-Cov-2 infection and should not be used as the sole basis for treatment or other patient management decisions. A negative result may occur with  improper specimen collection/handling, submission of specimen other than nasopharyngeal swab, presence of viral mutation(s) within the areas targeted by this assay, and inadequate number of viral copies(<138 copies/mL). A negative result must be  combined with clinical observations, patient history, and epidemiological information. The expected result is Negative.  Fact Sheet for Patients:  BloggerCourse.comhttps://www.fda.gov/media/152166/download  Fact Sheet for Healthcare Providers:  SeriousBroker.ithttps://www.fda.gov/media/152162/download  This test is no t yet approved or cleared by the [place] FDA and  has been authorized for detection and/or diagnosis of SARS-CoV-2 by FDA under an Emergency Use Authorization (EUA). This EUA will remain  in effect (meaning this test can be used) for the duration of the COVID-19 declaration under Section 564(b)(1) of the Act, 21 U.S.C.section 360bbb-3(b)(1), unless the authorization is terminated  or revoked sooner.       Influenza A by PCR NEGATIVE NEGATIVE Final   Influenza B by PCR NEGATIVE NEGATIVE Final    Comment: (NOTE) The Xpert Xpress SARS-CoV-2/FLU/RSV plus assay is intended as an aid in the diagnosis of influenza from Nasopharyngeal swab specimens and should not be used as a sole basis for treatment. Nasal washings and aspirates are unacceptable for Xpert Xpress SARS-CoV-2/FLU/RSV testing.  Fact Sheet for Patients: BloggerCourse.comhttps://www.fda.gov/media/152166/download  Fact Sheet for Healthcare Providers: SeriousBroker.ithttps://www.fda.gov/media/152162/download  This test is not yet approved or cleared by the [place] FDA and has been authorized for detection and/or diagnosis of SARS-CoV-2 by FDA under an Emergency Use Authorization (EUA). This EUA will remain in effect (meaning this test can be used) for the duration of the COVID-19 declaration under Section 564(b)(1) of the Act, 21 U.S.C. section 360bbb-3(b)(1), unless the authorization is terminated or revoked.  Performed at [facility], [address] [place], [place] [address]   Culture, blood (Routine X 2) w Reflex to ID Panel     Status: None (Preliminary result)   Collection Time: 03/31/20 11:50 PM   Specimen: BLOOD  Result Value Ref  Range Status   Specimen Description BLOOD BLOOD LEFT WRIST  Final   Special Requests   Final    BOTTLES DRAWN AEROBIC AND ANAEROBIC Blood Culture adequate volume   Culture   Final    NO GROWTH 2 DAYS Performed at [facility], [address] [place], [place] [address]    Report Status PENDING  Incomplete  MRSA PCR Screening     Status: None   Collection Time: 04/02/20 11:40 AM   Specimen: Nasal Mucosa; Nasopharyngeal  Result Value Ref Range Status   MRSA by PCR NEGATIVE NEGATIVE Final    Comment:        The GeneXpert MRSA Assay (FDA approved for NASAL specimens only), is one component of a comprehensive MRSA colonization surveillance program. It is not intended to diagnose MRSA infection nor to guide or monitor treatment for MRSA infections. Performed at [facility], [address] [place], [place] [address]     IMAGING RESULTS: Findings most consistent with Terry discitis and osteomyelitis at C5-6 with epidural phlegmon and prevertebral infection I have personally reviewed the films ? Impression/Recommendation ?45 year old female with a history of IV drug use and past history of lumbar osteomyelitis with Serratia then with MRSA status post surgery  and fusion presents with cervical pain and fever.  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 Patient is currently on vancomycin and cefepime.  1 set of blood culture has been sent and it has been negative so far.  Patient has couple of scabbed areas on the right forearm and hand from mainlining.  No obvious pus.  Attempted to send some cultures  Patient will need aspiration and cultures taken from the cervical vertebra /phlegmon area.  May benefit from washout as well. ? ? ___________________________________________________ Discussed with patient, and care team.  Note:  This document was prepared using Dragon voice recognition software and may  include unintentional dictation errors.

## 2020-04-02 NOTE — Progress Notes (Signed)
   04/02/20 2054  Assess: MEWS Score  Temp (!) 102.5 F (39.2 C) (nurse notified)  BP 121/77  Pulse Rate 89  Resp 16  SpO2 98 %  O2 Device Room Air  Assess: MEWS Score  MEWS Temp 2  MEWS Systolic 0  MEWS Pulse 0  MEWS RR 0  MEWS LOC 0  MEWS Score 2  MEWS Score Color Yellow  Assess: if the MEWS score is Yellow or Red  Were vital signs taken at a resting state? Yes  Focused Assessment No change from prior assessment  Early Detection of Sepsis Score *See Row Information* Low  MEWS guidelines implemented *See Row Information* Yes  Treat  MEWS Interventions Administered scheduled meds/treatments;Administered prn meds/treatments  Pain Scale 0-10  Pain Score 8  Take Vital Signs  Increase Vital Sign Frequency  Yellow: Q 2hr X 2 then Q 4hr X 2, if remains yellow, continue Q 4hrs  Escalate  MEWS: Escalate Yellow: discuss with charge nurse/RN and consider discussing with provider and RRT  Notify: Charge Nurse/RN  Name of Charge Nurse/RN Notified Linnise, RN  Date Charge Nurse/RN Notified 04/02/20  Time Charge Nurse/RN Notified 2100  Document  Patient Outcome Other (Comment) (continue to monitor)

## 2020-04-02 NOTE — Progress Notes (Signed)
*  PRELIMINARY RESULTS* Echocardiogram 2D Echocardiogram has been performed.  Susan Terry Susan Terry 04/02/2020, 9:17 AM

## 2020-04-03 DIAGNOSIS — R509 Fever, unspecified: Secondary | ICD-10-CM

## 2020-04-03 LAB — CBC
HCT: 36 % (ref 36.0–46.0)
Hemoglobin: 11.8 g/dL — ABNORMAL LOW (ref 12.0–15.0)
MCH: 26.8 pg (ref 26.0–34.0)
MCHC: 32.8 g/dL (ref 30.0–36.0)
MCV: 81.6 fL (ref 80.0–100.0)
Platelets: 149 10*3/uL — ABNORMAL LOW (ref 150–400)
RBC: 4.41 MIL/uL (ref 3.87–5.11)
RDW: 14.6 % (ref 11.5–15.5)
WBC: 6 10*3/uL (ref 4.0–10.5)
nRBC: 0 % (ref 0.0–0.2)

## 2020-04-03 LAB — BASIC METABOLIC PANEL
Anion gap: 10 (ref 5–15)
BUN: 10 mg/dL (ref 6–20)
CO2: 22 mmol/L (ref 22–32)
Calcium: 8 mg/dL — ABNORMAL LOW (ref 8.9–10.3)
Chloride: 99 mmol/L (ref 98–111)
Creatinine, Ser: 0.6 mg/dL (ref 0.44–1.00)
GFR, Estimated: >60 mL/min (ref >60)
Glucose, Bld: 179 mg/dL — ABNORMAL HIGH (ref 70–99)
Potassium: 3 mmol/L — ABNORMAL LOW (ref 3.5–5.1)
Sodium: 131 mmol/L — ABNORMAL LOW (ref 135–145)

## 2020-04-03 LAB — C-REACTIVE PROTEIN: CRP: 9.7 mg/dL — ABNORMAL HIGH (ref <1.0)

## 2020-04-03 MED ORDER — HYDROMORPHONE HCL 1 MG/ML IJ SOLN
1.5000 mg | INTRAMUSCULAR | Status: DC | PRN
  Administered 2020-04-03 – 2020-04-12 (×74): 1.5 mg via INTRAVENOUS
  Filled 2020-04-03 (×74): qty 2

## 2020-04-03 MED ORDER — OXYCODONE HCL 5 MG PO TABS
10.0000 mg | ORAL_TABLET | Freq: Four times a day (QID) | ORAL | Status: DC | PRN
  Administered 2020-04-03 – 2020-04-13 (×28): 10 mg via ORAL
  Filled 2020-04-03 (×30): qty 2

## 2020-04-03 MED ORDER — POTASSIUM CHLORIDE CRYS ER 20 MEQ PO TBCR
40.0000 meq | EXTENDED_RELEASE_TABLET | ORAL | Status: AC
  Administered 2020-04-03 (×2): 40 meq via ORAL
  Filled 2020-04-03 (×2): qty 2

## 2020-04-03 NOTE — Progress Notes (Signed)
Date of Admission:  03/31/2020     ID: Susan Terry is a 45 y.o. female  Principal Problem:   Osteomyelitis of vertebra of cervical region [facility](HCC) Active Problems:   Discitis of cervical region   Tobacco use disorder   IVDU (intravenous drug user)   Heroin abuse (HCC)   Pain in neck    Subjective: Having fever and sweats Neck pain and cramps   Medications:  . folic acid  1 mg Oral Daily  . multivitamin with minerals  1 tablet Oral Daily  . nicotine  21 mg Transdermal Daily  . pantoprazole (PROTONIX) IV  40 mg Intravenous Q24H  . thiamine  100 mg Oral Daily   Or  . thiamine  100 mg Intravenous Daily    Objective: Vital signs in last 24 hours: Patient Vitals for the past 24 hrs:  BP Temp Temp src Pulse Resp SpO2  04/03/20 2002 109/68 99.1 F (37.3 C) -- 84 17 100 %  04/03/20 1614 121/67 (!) 102 F (38.9 C) -- 96 17 98 %  04/03/20 1311 126/85 98.5 F (36.9 C) -- 84 16 100 %  04/03/20 0915 115/65 100.3 F (37.9 C) -- 91 15 97 %  04/03/20 0509 118/63 98.7 F (37.1 C) Oral 83 18 98 %  04/03/20 0058 126/80 98.9 F (37.2 C) Oral 87 17 97 %  04/02/20 2254 114/64 99.7 F (37.6 C) Oral 89 16 98 %  04/02/20 2150 -- (!) 102 F (38.9 C) Oral -- -- --  04/02/20 2054 121/77 (!) 102.5 F (39.2 C) Oral 89 16 98 %   PHYSICAL EXAM:  General: Alert, cooperative, in distress,  Lungs: b/l air entry Heart: s1s2 Abdomen: Soft, non-tender,not distended. Bowel sounds normal. No masses Extremities: atraumatic, no cyanosis. No edema. No clubbing Skin: No rashes or lesions. Or bruising Lymph: Cervical, supraclavicular normal. Neurologic: Grossly non-focal  Lab Results Recent Labs    03/31/20 1356 04/02/20 0445 04/03/20 0823  WBC 6.7  --  6.0  HGB 12.8  --  11.8*  HCT 39.9  --  36.0  NA 135  --  131*  K 4.2  --  3.0*  CL 104  --  99  CO2 22  --  22  BUN 9  --  10  CREATININE 0.54 0.54 0.60   Liver Panel Recent Labs    03/31/20 1356  PROT 7.4  ALBUMIN 3.3*  AST 39   ALT 35  ALKPHOS 85  BILITOT 0.7   Sedimentation Rate Recent Labs    03/31/20 2350  ESRSEDRATE 29*   C-Reactive Protein Recent Labs    04/02/20 0445  CRP 19.6*    Microbiology:  Studies/Results: CT CERVICAL SPINE WO CONTRAST  Result Date: 04/01/2020 CLINICAL DATA:  Patient with a history of IV drug abuse and discitis and osteomyelitis. Severe neck pain. EXAM: CT CERVICAL SPINE WITHOUT CONTRAST TECHNIQUE: Multidetector CT imaging of the cervical spine was performed without intravenous contrast. Multiplanar CT image reconstructions were also generated. COMPARISON:  MRI cervical spine without contrast 03/31/2020 and MRI cervical spine after contrast today. Plain films cervical spine 06/29/2017. FINDINGS: Alignment: There is mild kyphosis about the C5-6 level. Straightening of lordosis is also noted. Skull base and vertebrae: No fracture. There is endplate destructive change at C5-6 and anterior and posterior endplate spurring. Soft tissues and spinal canal: Prominent prevertebral fluid is identified extending from approximately C5-6 to the anterior arch of C1. Fluid measures approximately 7 cm craniocaudal x 0.8 cm AP x  3 cm transverse. Disc levels: Central canal and bilateral foraminal narrowing are seen at C5-6 due to a disc osteophyte complex and uncovertebral disease. Scattered mild-to-moderate facet arthropathy noted. Upper chest: Lung apices are clear. Other: None. IMPRESSION: Prevertebral fluid worrisome for infection. Please see report of MRI scan with contrast today describing likely acute discitis and osteomyelitis at C5-6. Findings consistent with prior discitis and osteomyelitis at C5-6. Endplate spurring and uncovertebral disease result in central canal stenosis at C5-6. Electronically Signed   By: Drusilla Kanner M.D.   On: 04/01/2020 12:45   MR CERVICAL SPINE W CONTRAST  Result Date: 04/01/2020 CLINICAL DATA:  Severe neck pain in a patient with a history of IV drug abuse and  discitis and osteomyelitis. EXAM: MRI CERVICAL SPINE WITH CONTRAST TECHNIQUE: Multiplanar, multisequence MR imaging of the cervical spine was performed following the administration of intravenous contrast. COMPARISON:  MRI cervical spine without contrast 03/31/2020. FINDINGS: There is epidural enhancement at virtually all levels of the cervical spine. Fluid and enhancement are also seen anterior to the cervical spine. No abscess is identified. There is mild enhancement in the C5-6 endplates and a punctate foci of enhancement in the disc interspace. IMPRESSION: Findings most consistent with early discitis and osteomyelitis at C5-6 with epidural phlegmon and prevertebral infection. Negative for abscess. Electronically Signed   By: Drusilla Kanner M.D.   On: 04/01/2020 12:40   DG Ankle Left Port  Result Date: 04/01/2020 CLINICAL DATA:  LEFT ankle pain. No known injury. Initial encounter. EXAM: PORTABLE LEFT ANKLE - 2 VIEW COMPARISON:  None. FINDINGS: There is no evidence of fracture, dislocation, or joint effusion. There is no evidence of arthropathy or other focal bone abnormality. Soft tissues are unremarkable. IMPRESSION: Negative. Electronically Signed   By: Harmon Pier M.D.   On: 04/01/2020 13:20   ECHOCARDIOGRAM COMPLETE  Result Date: 04/02/2020    ECHOCARDIOGRAM REPORT   Patient Name:   Susan Terry Date of Exam: 04/02/2020 Medical Rec #:  [id number]     Height:       67.0 in Accession #:    [id number]    Weight:       149.5 lb Date of Birth:  03-Sep-1975    BSA:          1.787 m Patient Age:    44 years      BP:           125/79 mmHg Patient Gender: F             HR:           85 bpm. Exam Location:  [facility] Procedure: 2D Echo, Color Doppler and Cardiac Doppler Indications:     Intravenous drug user  History:         Patient has no prior history of Echocardiogram examinations.                  Previous history of covid.  Sonographer:     Humphrey Rolls RDCS (AE) Referring Phys:  [id number] Eliezer Mccoy PATEL Diagnosing  Phys: Lorine Bears MD  Sonographer Comments: Suboptimal apical window and suboptimal subcostal window. Image acquisition challenging due to uncooperative patient. IMPRESSIONS  1. Left ventricular ejection fraction, by estimation, is 55 to 60%. The left ventricle has normal function. The left ventricle has no regional wall motion abnormalities. Left ventricular diastolic parameters were normal.  2. Right ventricular systolic function is normal. The right ventricular size is normal. Tricuspid regurgitation signal is inadequate for assessing PA  pressure.  3. The mitral valve is normal in structure. No evidence of mitral valve regurgitation. No evidence of mitral stenosis.  4. The aortic valve is normal in structure. Aortic valve regurgitation is not visualized. No aortic stenosis is present. Conclusion(s)/Recommendation(s): No evidence of valvular vegetations on this transthoracic echocardiogram. FINDINGS  Left Ventricle: Left ventricular ejection fraction, by estimation, is 55 to 60%. The left ventricle has normal function. The left ventricle has no regional wall motion abnormalities. The left ventricular internal cavity size was normal in size. There is  no left ventricular hypertrophy. Left ventricular diastolic parameters were normal. Right Ventricle: The right ventricular size is normal. No increase in right ventricular wall thickness. Right ventricular systolic function is normal. Tricuspid regurgitation signal is inadequate for assessing PA pressure. Left Atrium: Left atrial size was normal in size. Right Atrium: Right atrial size was normal in size. Pericardium: There is no evidence of pericardial effusion. Mitral Valve: The mitral valve is normal in structure. No evidence of mitral valve regurgitation. No evidence of mitral valve stenosis. MV peak gradient, 1.6 mmHg. The mean mitral valve gradient is 1.0 mmHg. Tricuspid Valve: The tricuspid valve is normal in structure. Tricuspid valve regurgitation is not  demonstrated. No evidence of tricuspid stenosis. Aortic Valve: The aortic valve is normal in structure. Aortic valve regurgitation is not visualized. No aortic stenosis is present. Aortic valve mean gradient measures 4.0 mmHg. Aortic valve peak gradient measures 6.6 mmHg. Aortic valve area, by VTI measures 1.87 cm. Pulmonic Valve: The pulmonic valve was normal in structure. Pulmonic valve regurgitation is not visualized. No evidence of pulmonic stenosis. Aorta: The aortic root is normal in size and structure. Venous: The inferior vena cava was not well visualized. IAS/Shunts: No atrial level shunt detected by color flow Doppler.  LEFT VENTRICLE PLAX 2D LVIDd:         4.60 cm  Diastology LVIDs:         3.20 cm  LV e' medial:    7.07 cm/s LV PW:         0.90 cm  LV E/e' medial:  8.8 LV IVS:        0.80 cm  LV e' lateral:   6.96 cm/s LVOT diam:     1.90 cm  LV E/e' lateral: 8.9 LV SV:         44 LV SV Index:   24 LVOT Area:     2.84 cm  RIGHT VENTRICLE RV Basal diam:  3.00 cm LEFT ATRIUM           Index       RIGHT ATRIUM           Index LA diam:      3.50 cm 1.96 cm/m  RA Area:     11.80 cm LA Vol (A4C): 41.5 ml 23.22 ml/m RA Volume:   26.00 ml  14.55 ml/m  AORTIC VALVE                   PULMONIC VALVE AV Area (Vmax):    2.00 cm    PV Vmax:       1.32 m/s AV Area (Vmean):   1.93 cm    PV Vmean:      94.000 cm/s AV Area (VTI):     1.87 cm    PV VTI:        0.229 m AV Vmax:           128.00 cm/s PV Peak grad:  7.0  mmHg AV Vmean:          97.100 cm/s PV Mean grad:  4.0 mmHg AV VTI:            0.233 m AV Peak Grad:      6.6 mmHg AV Mean Grad:      4.0 mmHg LVOT Vmax:         90.10 cm/s LVOT Vmean:        66.000 cm/s LVOT VTI:          0.154 m LVOT/AV VTI ratio: 0.66  AORTA Ao Root diam: 2.80 cm MITRAL VALVE MV Area (PHT): 6.60 cm    SHUNTS MV Area VTI:   3.39 cm    Systemic VTI:  0.15 m MV Peak grad:  1.6 mmHg    Systemic Diam: 1.90 cm MV Mean grad:  1.0 mmHg MV Vmax:       0.63 m/s MV Vmean:      48.0 cm/s MV  Decel Time: 115 msec MV E velocity: 62.10 cm/s MV A velocity: 61.70 cm/s MV E/A ratio:  1.01 Lorine Bears MD Electronically signed by Lorine Bears MD Signature Date/Time: 04/02/2020/11:47:11 AM    Final      Assessment/Plan: 45 year old female with history of IV drug use in the past history of lumbar osteomyelitis  with Serratia, MRSA infection status post surgery and fusion presents with cervical pain and fever  Cervical vertebral osteomyelitis and discitis involving C5-C6 with epidural phlegmon extending up to C1.  Patient is currently on Vanco and cefepime.  She is spiking fevers up to 102.  She will need debridement of the cervical spine.  Patient has needle puncture site small wounds.  Culture was attempted yesterday.  Ongoing fever.  We will get blood cultures.  Stop  History of lumbar discitis and osteomyelitis with a fusion surgery  History of MRSA infection  History of Serratia infection.  Discussed the management with the care team.

## 2020-04-03 NOTE — Progress Notes (Signed)
   04/01/20 1524  Assess: MEWS Score  Temp (!) 101.7 F (38.7 C)  BP 123/73  Pulse Rate 97  Resp 18  SpO2 95 %  O2 Device Room Air  Assess: MEWS Score  MEWS Temp 2  MEWS Systolic 0  MEWS Pulse 0  MEWS RR 0  MEWS LOC 0  MEWS Score 2  MEWS Score Color Yellow  Assess: if the MEWS score is Yellow or Red  Were vital signs taken at a resting state? Yes  Focused Assessment No change from prior assessment  Early Detection of Sepsis Score *See Row Information* Low  MEWS guidelines implemented *See Row Information* Yes  Treat  MEWS Interventions Administered prn meds/treatments  Pain Scale 0-10  Pain Score 10  Pain Type Acute pain  Pain Location Neck  Pain Orientation Right  Pain Radiating Towards back/clavicle  Pain Descriptors / Indicators Aching  Pain Frequency Constant  Pain Onset On-going  Patients Stated Pain Goal 0  Pain Intervention(s) Medication (See eMAR)  Take Vital Signs  Increase Vital Sign Frequency  Yellow: Q 2hr X 2 then Q 4hr X 2, if remains yellow, continue Q 4hrs  Escalate  MEWS: Escalate Yellow: discuss with charge nurse/RN and consider discussing with provider and RRT  Notify: Charge Nurse/RN  Name of Charge Nurse/RN Notified Judeth Cornfield,  Date Charge Nurse/RN Notified 04/01/20  Time Charge Nurse/RN Notified 1536  Notify: Provider  Provider Name/Title Irene Limbo  Date Provider Notified 04/01/20  Time Provider Notified 1536  Notification Type Page  Notification Reason Other (Comment) (elevated temp)  Provider response See new orders  Date of Provider Response 04/01/20  Time of Provider Response 1537  Inserted for Wynelle Link RN

## 2020-04-03 NOTE — Progress Notes (Signed)
PROGRESS NOTE  Susan Terry SJG:[id number] DOB: 1975-10-04 DOA: 03/31/2020 [facility]  Brief History    44yow PMH IVDU w/ last heroin use within last week; lumbar discitis/osteomyelitis, MRSA bacteremia PRESENTED w/ new neck pain, chills and iaphoresis. Admitted for new acute discitiitis and osteo of C5-6 and neurosurgery consult. Medical management recommended. Echo unrevealing. Continue abx as per infectious disease.  Neurosurgery planning to obtain aspiration.  A & P  Acute discitis, osteo of C5-6 secondary to IVDU --Continues to have pain, continue analgesia.  Appreciate neurosurgery recommendations.  Further imaging confirms discitis and osteomyelitis.  Echocardiogram unremarkable. --ID consultation pending for today  Mild cellulitis right hand, bilateral hands appear improved today with no exudate noted.  No abscess or fluctuance noted.  No pain reported by patient.  Pain in neck secondary to above --Improved control with increase in Dilaudid.  Added oxycodone for moderate pain.  Left ankle pain resolved.  IVDU --last heroin use just before admission --Interested in quitting --After about a week of hospitalization when inflammation has decreased with antibiotics, transition to Suboxone --screen HIV   Disposition Plan:  Discussion:    Status is: Inpatient  Remains inpatient appropriate because:IV treatments appropriate due to intensity of illness or inability to take PO and Inpatient level of care appropriate due to severity of illness   Dispo: The patient is from: Home              Anticipated d/c is to: Home              Patient currently is not medically stable to d/c.   Difficult to place patient Yes  DVT prophylaxis: Place and maintain sequential compression device Start: 03/31/20 2000 SCDs Start: 03/31/20 1929   Code Status: Full Code Level of care: Med-Surg Family Communication: none  Brendia Sacks, MD  [facility] Direct contact: see  [email or web address] (further directions at bottom of note if needed) 7PM-7AM contact night coverage as at bottom of note 04/03/2020, 4:14 PM  LOS: 3 days   Significant Hospital Events   . 3/5 admit for cervical osteo and discitis   Consults:  . Neurosurgery . ID   Procedures:  .   Significant Diagnostic Tests:  Marland Kitchen    Micro Data:  .    Antimicrobials:  .   Interval History/Subjective  CC: f/u neck pain  Feels better now w/ less pain with increased Dilaudid Gets anxious at times No new issues reported  Objective   Vitals:  Vitals:   04/03/20 0915 04/03/20 1311  BP: 115/65 126/85  Pulse: 91 84  Resp: 15 16  Temp: 100.3 F (37.9 C) 98.5 F (36.9 C)  SpO2: 97% 100%    Exam:  Constitutional:   . Appears calm and comfortable ENMT:  . grossly normal hearing  Respiratory:  . CTA bilaterally, no w/r/r.  . Respiratory effort normal.  Cardiovascular:  . RRR, no m/r/g Psychiatric:  . Mental status o Mood, affect appropriate  I have personally reviewed the following:   Today's Data  . Na+ 131 . K+ 3.0 . CBC stable  Scheduled Meds: . folic acid  1 mg Oral Daily  . multivitamin with minerals  1 tablet Oral Daily  . nicotine  21 mg Transdermal Daily  . pantoprazole (PROTONIX) IV  40 mg Intravenous Q24H  . potassium chloride  40 mEq Oral Q4H  . thiamine  100 mg Oral Daily   Or  . thiamine  100 mg Intravenous Daily   Continuous Infusions: .  ceFEPime (MAXIPIME) IV 2 g (04/03/20 1432)  . vancomycin 1,000 mg (04/03/20 1042)    Principal Problem:   Osteomyelitis of vertebra of cervical region [facility]) Active Problems:   Discitis of cervical region   Tobacco use disorder   IVDU (intravenous drug user)   Heroin abuse (HCC)   Pain in neck   LOS: 3 days   How to contact the [facility] Attending or Consulting provider 7A - 7P or covering provider during after hours 7P -7A, for this patient?  1. Check the care team in [facility] and look for a) attending/consulting [facility] provider  listed and b) the [facility] team listed 2. Log into [email or web address] and use [place]'s universal password to access. If you do not have the password, please contact the hospital operator. 3. Locate the [facility] provider you are looking for under [facility] and page to a number that you can be directly reached. 4. If you still have difficulty reaching the provider, please page the [facility] (Director on Call) for the Hospitalists listed on amion for assistance.     '

## 2020-04-04 DIAGNOSIS — Z8614 Personal history of Methicillin resistant Staphylococcus aureus infection: Secondary | ICD-10-CM

## 2020-04-04 LAB — C-REACTIVE PROTEIN: CRP: 6.2 mg/dL — ABNORMAL HIGH (ref <1.0)

## 2020-04-04 LAB — CREATININE, SERUM
Creatinine, Ser: 0.69 mg/dL (ref 0.44–1.00)
GFR, Estimated: >60 mL/min (ref >60)

## 2020-04-04 LAB — BASIC METABOLIC PANEL
Anion gap: 9 (ref 5–15)
BUN: 8 mg/dL (ref 6–20)
CO2: 23 mmol/L (ref 22–32)
Calcium: 8.5 mg/dL — ABNORMAL LOW (ref 8.9–10.3)
Chloride: 101 mmol/L (ref 98–111)
Creatinine, Ser: 0.59 mg/dL (ref 0.44–1.00)
GFR, Estimated: >60 mL/min (ref >60)
Glucose, Bld: 131 mg/dL — ABNORMAL HIGH (ref 70–99)
Potassium: 3.8 mmol/L (ref 3.5–5.1)
Sodium: 133 mmol/L — ABNORMAL LOW (ref 135–145)

## 2020-04-04 LAB — CBC
HCT: 37.9 % (ref 36.0–46.0)
Hemoglobin: 12.5 g/dL (ref 12.0–15.0)
MCH: 26.7 pg (ref 26.0–34.0)
MCHC: 33 g/dL (ref 30.0–36.0)
MCV: 80.8 fL (ref 80.0–100.0)
Platelets: 185 10*3/uL (ref 150–400)
RBC: 4.69 MIL/uL (ref 3.87–5.11)
RDW: 14.7 % (ref 11.5–15.5)
WBC: 7.7 10*3/uL (ref 4.0–10.5)
nRBC: 0 % (ref 0.0–0.2)

## 2020-04-04 LAB — MAGNESIUM: Magnesium: 1.9 mg/dL (ref 1.7–2.4)

## 2020-04-04 LAB — VANCOMYCIN, TROUGH: Vancomycin Tr: 8 ug/mL — ABNORMAL LOW (ref 15–20)

## 2020-04-04 LAB — PHOSPHORUS: Phosphorus: 2 mg/dL — ABNORMAL LOW (ref 2.5–4.6)

## 2020-04-04 LAB — VANCOMYCIN, PEAK: Vancomycin Pk: 22 ug/mL — ABNORMAL LOW (ref 30–40)

## 2020-04-04 MED ORDER — VANCOMYCIN HCL 1000 MG/200ML IV SOLN
1000.0000 mg | Freq: Three times a day (TID) | INTRAVENOUS | Status: DC
  Administered 2020-04-04 – 2020-04-05 (×2): 1000 mg via INTRAVENOUS
  Filled 2020-04-04 (×6): qty 200

## 2020-04-04 MED ORDER — SODIUM CHLORIDE 0.9 % IV SOLN
INTRAVENOUS | Status: DC | PRN
  Administered 2020-04-04 – 2020-04-08 (×11): 250 mL via INTRAVENOUS

## 2020-04-04 MED ORDER — POTASSIUM & SODIUM PHOSPHATES 280-160-250 MG PO PACK
1.0000 | PACK | Freq: Three times a day (TID) | ORAL | Status: AC
  Administered 2020-04-04 – 2020-04-05 (×4): 1 via ORAL
  Filled 2020-04-04 (×4): qty 1

## 2020-04-04 NOTE — Progress Notes (Signed)
PROGRESS NOTE    Susan Terry  ZDG:[id number] DOB: 06-20-1975 DOA: 03/31/2020 [facility]   Assessment & Plan:   Principal Problem:   Osteomyelitis of vertebra of cervical region [facility]) Active Problems:   Discitis of cervical region   Tobacco use disorder   IVDU (intravenous drug user)   Heroin abuse (HCC)   Pain in neck   Acute discitis & osteomyelitis of C5-6: still w/ significant neck pain. Secondary to IVDU. Continue on IV vanco, cefepime. No acute surgery as per neurosurg. Echo does not show any vegetations. ID following and recs apprec   Mild cellulitis right hand: continue on IV cefepime, vanco as per ID.   IVDU: last heroin use was just before admission. Illicit drug use cessation counseling  Hypophosphatemia: will replete phosp. Will continue to monitor   DVT prophylaxis: SCDs Code Status: full Family Communication:  Disposition Plan: possibly back home   Level of care: Med-Surg   Status is: Inpatient  Remains inpatient appropriate because:Unsafe d/c plan, IV treatments appropriate due to intensity of illness or inability to take PO and Inpatient level of care appropriate due to severity of illness   Dispo: The patient is from: Home              Anticipated d/c is to: Home              Patient currently is not medically stable to d/c.   Difficult to place patient Yes     Consultants:   neurosurg  ID   Procedures   Antimicrobials: vanco, cefepime    Subjective: Pt c/o severe neck pain   Objective: Vitals:   04/04/20 0037 04/04/20 0433 04/04/20 0753 04/04/20 1318  BP: (!) 112/55 119/80 120/88 116/74  Pulse: (!) 102 88 86 93  Resp: 16 18 19 15   Temp: 100.1 F (37.8 C) 99.5 F (37.5 C) 98.4 F (36.9 C) 98.9 F (37.2 C)  TempSrc: Oral     SpO2: 99% 99% 100% 100%  Weight:      Height:        Intake/Output Summary (Last 24 hours) at 04/04/2020 1323 Last data filed at 04/03/2020 1835 Gross per 24 hour  Intake 420 ml  Output --   Net 420 ml   Filed Weights   03/31/20 1150 04/02/20 0500  Weight: 63.5 kg 67.8 kg    Examination:  General exam: Appears uncomfortable  Respiratory system: Clear to auscultation. Respiratory effort normal. Cardiovascular system: S1 & S2 +. No rubs, gallops or clicks.  Gastrointestinal system: Abdomen is nondistended, soft and nontender. Hypoactive bowel sounds heard. Central nervous system: Alert and oriented. Moves all extremities  Psychiatry: Judgement and insight appear normal. Flat mood and affect     Data Reviewed: I have personally reviewed following labs and imaging studies  CBC: Recent Labs  Lab 03/31/20 1356 04/03/20 0823 04/04/20 0933  WBC 6.7 6.0 7.7  NEUTROABS 5.2  --   --   HGB 12.8 11.8* 12.5  HCT 39.9 36.0 37.9  MCV 83.3 81.6 80.8  PLT 137* 149* 185   Basic Metabolic Panel: Recent Labs  Lab 03/31/20 1356 04/02/20 0445 04/03/20 0823 04/04/20 0018 04/04/20 0933  NA 135  --  131*  --  133*  K 4.2  --  3.0*  --  3.8  CL 104  --  99  --  101  CO2 22  --  22  --  23  GLUCOSE 111*  --  179*  --  131*  BUN 9  --  10  --  8  CREATININE 0.54 0.54 0.60 0.69 0.59  CALCIUM 8.5*  --  8.0*  --  8.5*  MG  --   --   --   --  1.9  PHOS  --   --   --   --  2.0*   GFR: Estimated Creatinine Clearance: 89.1 mL/min (by C-G formula based on SCr of 0.59 mg/dL). Liver Function Tests: Recent Labs  Lab 03/31/20 1356  AST 39  ALT 35  ALKPHOS 85  BILITOT 0.7  PROT 7.4  ALBUMIN 3.3*   No results for input(s): LIPASE, AMYLASE in the last 168 hours. No results for input(s): AMMONIA in the last 168 hours. Coagulation Profile: No results for input(s): INR, PROTIME in the last 168 hours. Cardiac Enzymes: No results for input(s): CKTOTAL, CKMB, CKMBINDEX, TROPONINI in the last 168 hours. BNP (last 3 results) No results for input(s): PROBNP in the last 8760 hours. HbA1C: No results for input(s): HGBA1C in the last 72 hours. CBG: No results for input(s): GLUCAP  in the last 168 hours. Lipid Profile: No results for input(s): CHOL, HDL, LDLCALC, TRIG, CHOLHDL, LDLDIRECT in the last 72 hours. Thyroid Function Tests: No results for input(s): TSH, T4TOTAL, FREET4, T3FREE, THYROIDAB in the last 72 hours. Anemia Panel: No results for input(s): VITAMINB12, FOLATE, FERRITIN, TIBC, IRON, RETICCTPCT in the last 72 hours. Sepsis Labs: Recent Labs  Lab 03/31/20 1356  LATICACIDVEN 1.0    Recent Results (from the past 240 hour(s))  Resp Panel by RT-PCR (Flu A&B, Covid) Nasopharyngeal Swab     Status: None   Collection Time: 03/31/20  8:01 PM   Specimen: Nasopharyngeal Swab; Nasopharyngeal(NP) swabs in vial transport medium  Result Value Ref Range Status   SARS Coronavirus 2 by RT PCR NEGATIVE NEGATIVE Final    Comment: (NOTE) SARS-CoV-2 target nucleic acids are NOT DETECTED.  The SARS-CoV-2 RNA is generally detectable in upper respiratory specimens during the acute phase of infection. The lowest concentration of SARS-CoV-2 viral copies this assay can detect is 138 copies/mL. A negative result does not preclude SARS-Cov-2 infection and should not be used as the sole basis for treatment or other patient management decisions. A negative result may occur with  improper specimen collection/handling, submission of specimen other than nasopharyngeal swab, presence of viral mutation(s) within the areas targeted by this assay, and inadequate number of viral copies(<138 copies/mL). A negative result must be combined with clinical observations, patient history, and epidemiological information. The expected result is Negative.  Fact Sheet for Patients:  BloggerCourse.com  Fact Sheet for Healthcare Providers:  SeriousBroker.it  This test is no t yet approved or cleared by the [place] FDA and  has been authorized for detection and/or diagnosis of SARS-CoV-2 by FDA under an Emergency Use Authorization  (EUA). This EUA will remain  in effect (meaning this test can be used) for the duration of the COVID-19 declaration under Section 564(b)(1) of the Act, 21 U.S.C.section 360bbb-3(b)(1), unless the authorization is terminated  or revoked sooner.       Influenza A by PCR NEGATIVE NEGATIVE Final   Influenza B by PCR NEGATIVE NEGATIVE Final    Comment: (NOTE) The Xpert Xpress SARS-CoV-2/FLU/RSV plus assay is intended as an aid in the diagnosis of influenza from Nasopharyngeal swab specimens and should not be used as a sole basis for treatment. Nasal washings and aspirates are unacceptable for Xpert Xpress SARS-CoV-2/FLU/RSV testing.  Fact Sheet for Patients: BloggerCourse.com  Fact  Sheet for Healthcare Providers: SeriousBroker.ithttps://www.fda.gov/media/152162/download  This test is not yet approved or cleared by the [place] FDA and has been authorized for detection and/or diagnosis of SARS-CoV-2 by FDA under an Emergency Use Authorization (EUA). This EUA will remain in effect (meaning this test can be used) for the duration of the COVID-19 declaration under Section 564(b)(1) of the Act, 21 U.S.C. section 360bbb-3(b)(1), unless the authorization is terminated or revoked.  Performed at [facility], [address] [place], [place] [address]   Culture, blood (Routine X 2) w Reflex to ID Panel     Status: None (Preliminary result)   Collection Time: 03/31/20 11:50 PM   Specimen: BLOOD  Result Value Ref Range Status   Specimen Description BLOOD BLOOD LEFT WRIST  Final   Special Requests   Final    BOTTLES DRAWN AEROBIC AND ANAEROBIC Blood Culture adequate volume   Culture   Final    NO GROWTH 4 DAYS Performed at [facility], [address] [place], [place] [address]    Report Status PENDING  Incomplete  MRSA PCR Screening     Status: None   Collection Time: 04/02/20 11:40 AM   Specimen: Nasal Mucosa; Nasopharyngeal  Result Value Ref Range  Status   MRSA by PCR NEGATIVE NEGATIVE Final    Comment:        The GeneXpert MRSA Assay (FDA approved for NASAL specimens only), is one component of a comprehensive MRSA colonization surveillance program. It is not intended to diagnose MRSA infection nor to guide or monitor treatment for MRSA infections. Performed at [facility], [address] [place], [place] [address]   Aerobic Culture w Gram Stain (superficial specimen)     Status: None (Preliminary result)   Collection Time: 04/02/20  7:23 PM   Specimen: Hand; Wound  Result Value Ref Range Status   Specimen Description   Final    HAND Performed at [facility], [address] [place], [place] [address]    Special Requests   Final    NONE Performed at [facility], [address] [place], [place] [address]    Gram Stain   Final    MODERATE WBC PRESENT, PREDOMINANTLY PMN RARE GRAM POSITIVE COCCI    Culture   Final    MODERATE STAPHYLOCOCCUS AUREUS SUSCEPTIBILITIES TO FOLLOW Performed at [facility], [address] [place], [place] [address]    Report Status PENDING  Incomplete  Culture, blood (Routine X 2) w Reflex to ID Panel     Status: None (Preliminary result)   Collection Time: 04/02/20 11:06 PM   Specimen: BLOOD  Result Value Ref Range Status   Specimen Description BLOOD RIGHT HAND  Final   Special Requests   Final    BOTTLES DRAWN AEROBIC AND ANAEROBIC Blood Culture adequate volume   Culture   Final    NO GROWTH 2 DAYS Performed at [facility], [address] [place], [place] [address]    Report Status PENDING  Incomplete  Culture, blood (Routine X 2) w Reflex to ID Panel     Status: None (Preliminary result)   Collection Time: 04/02/20 11:06 PM   Specimen: BLOOD  Result Value Ref Range Status   Specimen Description BLOOD LEFT AC  Final   Special Requests   Final    BOTTLES DRAWN AEROBIC AND ANAEROBIC Blood Culture adequate volume   Culture   Final     NO GROWTH 2 DAYS Performed at  [facility], [address], [place], [place] [address]    Report Status PENDING  Incomplete         Radiology Studies: No results found.      Scheduled Meds: . folic acid  1 mg Oral Daily  . multivitamin with minerals  1 tablet Oral Daily  . nicotine  21 mg Transdermal Daily  . pantoprazole (PROTONIX) IV  40 mg Intravenous Q24H  . thiamine  100 mg Oral Daily   Or  . thiamine  100 mg Intravenous Daily   Continuous Infusions: . sodium chloride 250 mL (04/04/20 1109)  . ceFEPime (MAXIPIME) IV 2 g (04/04/20 0451)  . vancomycin       LOS: 4 days    Time spent: 33 mins     Charise Killian, MD [facility] Pager [phone]  If 7PM-7AM, please contact night-coverage 04/04/2020, 1:23 PM

## 2020-04-04 NOTE — Progress Notes (Addendum)
Pharmacy Antibiotic Note  Susan Terry is a 45 y.o. 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 Pharmacy has been consulted for vancomycin dosing.   CT reveals C5-6 vertebral OM and epidural phlegmon.  She has PMH of L2-3 vertebral OM w/ MRSA and serratia  Today, 04/04/2020 Day # 4 vancomycin and cefepime  Renal: SCr stable  WBC WNL  Tm 102 1600 3/8  Vancomycin levels performed with 3/8 21:38 dose of vancomycin 1gm IV q12h  Peak = 22 @ 0018  Trough = 8 @ 0933  TTE from 3/7 negative for vegetations  Culture from hand with S. Aureus.    Awaiting possible plans for source control  Plan:  1) Continue Cefepime to 2 grams IV every 8 hours  2) Based on vancomycin levels, adjust vancomycin dose to 1000 mg IV Q 8 hrs to achieve AUC goal AND a vancomycin trough of 15-20 mcg/m d/t epidural phlegmon/abscess  Goal AUC 400-600  New dose will provide Vanco Peak 33, trough 15, AUC 557  Will plan to recheck vancomycin levels in ~48hours  Daily renal function assessment while on IV vancomycin  Await Hand culture and possible surgical plans  Height: 5\' 7"  (170.2 cm) Weight: 67.8 kg (149 lb 8 oz) IBW/kg (Calculated) : 61.6  Temp (24hrs), Avg:99.6 F (37.6 C), Min:98.4 F (36.9 C), Max:102 F (38.9 C)  Recent Labs  Lab 03/31/20 1356 04/02/20 0445 04/03/20 0823 04/04/20 0018 04/04/20 0933  WBC 6.7  --  6.0  --  7.7  CREATININE 0.54 0.54 0.60 0.69 0.59  LATICACIDVEN 1.0  --   --   --   --   VANCOTROUGH  --   --   --   --  8*  VANCOPEAK  --   --   --  22*  --     Estimated Creatinine Clearance: 89.1 mL/min (by C-G formula based on SCr of 0.59 mg/dL).    No Known Allergies  Antimicrobials this admission: 03/05 cefepime >>  03/05 metronidazole >> 03/05 vancomycin >>   Microbiology results: 03/5 BCx: NGTD 3/7 Bcx:  NGTD 3/7 Hand cx: S aureus 03/05 SARS CoV-2: negative 03/05 influenza A/B: negative    Thank you for allowing pharmacy to be a part of this patient's care.  05/05 04/04/2020 10:27 AM

## 2020-04-04 NOTE — Progress Notes (Signed)
Date of Admission:  03/31/2020     ID: Susan Terry is a 45 y.o. female  Principal Problem:   Osteomyelitis of vertebra of cervical region [facility]) Active Problems:   Discitis of cervical region   Tobacco use disorder   IVDU (intravenous drug user)   Heroin abuse (HCC)   Pain in neck    Subjective: Pain - cervical spine Fever chills  Medications:  . folic acid  1 mg Oral Daily  . multivitamin with minerals  1 tablet Oral Daily  . nicotine  21 mg Transdermal Daily  . pantoprazole (PROTONIX) IV  40 mg Intravenous Q24H  . thiamine  100 mg Oral Daily   Or  . thiamine  100 mg Intravenous Daily    Objective: Vital signs in last 24 hours: Patient Vitals for the past 24 hrs:  BP Temp Temp src Pulse Resp SpO2  04/04/20 0753 120/88 98.4 F (36.9 C) - 86 19 100 %  04/04/20 0433 119/80 99.5 F (37.5 C) - 88 18 99 %  04/04/20 0037 (!) 112/55 100.1 F (37.8 C) Oral (!) 102 16 99 %  04/03/20 2002 109/68 99.1 F (37.3 C) - 84 17 100 %  04/03/20 1614 121/67 (!) 102 F (38.9 C) - 96 17 98 %  04/03/20 1311 126/85 98.5 F (36.9 C) - 84 16 100 %   PHYSICAL EXAM:  General: Alert, cooperative, in some distress,  Lungs: Clear to auscultation bilaterally. No Wheezing or Rhonchi. No rales. Heart: s1s2 Abdomen: Soft, non-tender,not distended. Bowel sounds normal. No masses Extremities: atraumatic, no cyanosis. No edema. No clubbing Skin: No rashes or lesions. Or bruising Lymph: Cervical, supraclavicular normal. Neurologic: Grossly non-focal  Lab Results Recent Labs    04/03/20 0823 04/04/20 0018 04/04/20 0933  WBC 6.0  --  7.7  HGB 11.8*  --  12.5  HCT 36.0  --  37.9  NA 131*  --  133*  K 3.0*  --  3.8  CL 99  --  101  CO2 22  --  23  BUN 10  --  8  CREATININE 0.60 0.69 0.59   Liver Panel No results for input(s): PROT, ALBUMIN, AST, ALT, ALKPHOS, BILITOT, BILIDIR, IBILI in the last 72 hours. Sedimentation Rate No results for input(s): ESRSEDRATE in the last 72  hours. C-Reactive Protein Recent Labs    04/02/20 0445 04/03/20 0823  CRP 19.6* 9.7*    Microbiology: 03/31/2020 1 set of blood culture no growth 04/02/2020 superficial wound has staph aureus 04/02/2020 blood cultures no growth so far Studies/Results: No results found.   Assessment/Plan: 45 year old female with history of IV drug use, history of lumbar osteomyelitis with Serratia, MRSA, status post surgery and fusion in 2020 presents with cervical pain and fever  Cervical vertebral osteomyelitis and discitis involving C5-C6 with epidural phlegmon extending up to C1.  She is currently on Vanco and cefepime.  Blood cultures have been negative so far.  She spiking fevers up to 102.  She will need debridement of the cervical spine phlegmon.  Patient has multiple needle puncture site small wounds.  One of them was cultured.  Staph aureus growing in the culture.  But being an IV drug user there could be polymicrobial infection  and we cannot assume that the cervical spine infection is also caused by staph aureus as the blood culture have been neg. We will continue both Vanco and cefepime   History of lumbar discitis and osteomyelitis with a fusion surgery History of MRSA  infection History of Serratia infection Discussed the management with neurosurgery

## 2020-04-04 NOTE — Plan of Care (Signed)
°  Problem: Clinical Measurements: °Goal: Ability to maintain clinical measurements within normal limits will improve °Outcome: Progressing °Goal: Will remain free from infection °Outcome: Progressing °Goal: Diagnostic test results will improve °Outcome: Progressing °Goal: Respiratory complications will improve °Outcome: Progressing °Goal: Cardiovascular complication will be avoided °Outcome: Progressing °  °Problem: Coping: °Goal: Level of anxiety will decrease °Outcome: Progressing °  °Problem: Elimination: °Goal: Will not experience complications related to bowel motility °Outcome: Progressing °Goal: Will not experience complications related to urinary retention °Outcome: Progressing °  °Problem: Pain Managment: °Goal: General experience of comfort will improve °Outcome: Progressing °  °

## 2020-04-05 LAB — CULTURE, BLOOD (ROUTINE X 2)
Culture: NO GROWTH
Special Requests: ADEQUATE

## 2020-04-05 MED ORDER — SODIUM CHLORIDE 0.9% FLUSH
10.0000 mL | INTRAVENOUS | Status: DC | PRN
  Administered 2020-04-06 – 2020-04-10 (×14): 10 mL

## 2020-04-05 MED ORDER — SODIUM CHLORIDE 0.9 % IV SOLN
550.0000 mg | Freq: Every day | INTRAVENOUS | Status: AC
  Administered 2020-04-05 – 2020-04-13 (×9): 550 mg via INTRAVENOUS
  Filled 2020-04-05 (×9): qty 11

## 2020-04-05 MED ORDER — HYDROMORPHONE HCL 1 MG/ML IJ SOLN
1.0000 mg | INTRAMUSCULAR | Status: DC | PRN
  Administered 2020-04-09 – 2020-04-12 (×7): 1 mg via INTRAVENOUS
  Filled 2020-04-05 (×7): qty 1

## 2020-04-05 MED ORDER — SODIUM CHLORIDE 0.9% FLUSH
10.0000 mL | Freq: Two times a day (BID) | INTRAVENOUS | Status: DC
  Administered 2020-04-05: 10 mL
  Administered 2020-04-05: 22:00:00 30 mL
  Administered 2020-04-06 – 2020-04-12 (×14): 10 mL
  Administered 2020-04-13: 20 mL
  Administered 2020-04-13 – 2020-04-15 (×4): 10 mL
  Administered 2020-04-15: 10:00:00 20 mL
  Administered 2020-04-16: 22:00:00 10 mL
  Administered 2020-04-16: 10:00:00 30 mL
  Administered 2020-04-17: 10 mL

## 2020-04-05 MED ORDER — DICLOFENAC EPOLAMINE 1.3 % EX PTCH
1.0000 | MEDICATED_PATCH | Freq: Two times a day (BID) | CUTANEOUS | Status: DC
  Administered 2020-04-05: 1 via TRANSDERMAL
  Filled 2020-04-05 (×20): qty 1

## 2020-04-05 NOTE — Progress Notes (Signed)
ID Patient says after vancomycin the pain gets worse in her neck and shoulders.  She states whenever she receives vancomycin after that there is a burning sensation she has in that region.  She does not want to take it anymore. No bladder or bowel problems No weakness in extremities.  Patient Vitals for the past 24 hrs:  BP Temp Temp src Pulse Resp SpO2  04/05/20 2012 136/82 98.1 F (36.7 C) Oral 86 18 100 %  04/05/20 1520 138/71 98.8 F (37.1 C) -- 86 17 99 %  04/05/20 1126 130/83 99.4 F (37.4 C) -- 86 18 99 %  04/05/20 0743 101/64 98.6 F (37 C) -- 83 17 98 %  04/05/20 0429 132/86 98.7 F (37.1 C) -- 92 18 100 %  04/05/20 0039 119/78 99 F (37.2 C) -- 87 18 100 %   In distress Chest bilateral air entry Heart sound S1-S2 Abdomen soft   Labs CBC Latest Ref Rng & Units 04/04/2020 04/03/2020 03/31/2020  WBC 4.0 - 10.5 K/uL 7.7 6.0 6.7  Hemoglobin 12.0 - 15.0 g/dL 16.1 11.8(L) 12.8  Hematocrit 36.0 - 46.0 % 37.9 36.0 39.9  Platelets 150 - 400 K/uL 185 149(L) 137(L)   CMP Latest Ref Rng & Units 04/04/2020 04/04/2020 04/03/2020  Glucose 70 - 99 mg/dL 096(E) - 454(U)  BUN 6 - 20 mg/dL 8 - 10  Creatinine 9.81 - 1.00 mg/dL 1.91 4.78 2.95  Sodium 135 - 145 mmol/L 133(L) - 131(L)  Potassium 3.5 - 5.1 mmol/L 3.8 - 3.0(L)  Chloride 98 - 111 mmol/L 101 - 99  CO2 22 - 32 mmol/L 23 - 22  Calcium 8.9 - 10.3 mg/dL 6.2(Z) - 8.0(L)  Total Protein 6.5 - 8.1 g/dL - - -  Total Bilirubin 0.3 - 1.2 mg/dL - - -  Alkaline Phos 38 - 126 U/L - - -  AST 15 - 41 U/L - - -  ALT 0 - 44 U/L - - -   03/31/2020 blood culture no growth 04/02/2020 blood culture no growth 04/02/2020 right arm wound culture is MRSA  Impression/recommendation 45 year old female with history of IV drug use, history of lumbar osteomyelitis with Serratia and MRSA, status post surgery and fusion in 2020 presents with cervical pain and fever. Cervical vertebral osteomyelitis and discitis involving C5-C6 with epidural phlegmon extending up  to C1.  She is currently on Vanco and cefepime but has been refusing vancomycin because she thinks the pain is getting worse after the vancomycin.  So we will switch her to daptomycin.  8 mg/kg body weight.  Blood cultures have been negative The wound culture from one of the IV drug site has MRSA.  Will have to cover her for MRSA as well as for gram-negative's like Serratia.  As we do not know what the spine infection is due to. If she has fever or worsening pain then will have to repeat imaging.   History of lumbar discitis and osteomyelitis of the fusion surgery  History of MRSA infection next History of Serratia infection history  Neurosurgery discussed with patient and currently she is not for any intervention.

## 2020-04-05 NOTE — Progress Notes (Signed)
PROGRESS NOTE    Susan Terry  EGB:[id number] DOB: 1975/03/09 DOA: 03/31/2020 [facility]   Assessment & Plan:   Principal Problem:   Osteomyelitis of vertebra of cervical region [facility]) Active Problems:   Discitis of cervical region   Tobacco use disorder   IVDU (intravenous drug user)   Heroin abuse (HCC)   Pain in neck   Acute discitis & osteomyelitis of C5-6: secondary to IVDU. Still w/ significant neck pain. Continue on IV vanco, cefepime as per ID. Needs debridement as per ID. Echo did not show any vegetations. ID following and recs apprec   Mild cellulitis right hand: continue w/ IV vanco, cefepime as per ID   IVDU: last heroin use was just before admission. Illicit drug use cessation counseling  Hypophosphatemia: awaiting labs today still   DVT prophylaxis: SCDs Code Status: full Family Communication:  Disposition Plan: possibly back home   Level of care: Med-Surg   Status is: Inpatient  Remains inpatient appropriate because:Unsafe d/c plan, IV treatments appropriate due to intensity of illness or inability to take PO and Inpatient level of care appropriate due to severity of illness   Dispo: The patient is from: Home              Anticipated d/c is to: Home              Patient currently is not medically stable to d/c.   Difficult to place patient Yes     Consultants:   neurosurg  ID   Procedures   Antimicrobials: vanco, cefepime    Subjective: Pt still c/o severe neck pain   Objective: Vitals:   04/05/20 0039 04/05/20 0429 04/05/20 0743 04/05/20 1126  BP: 119/78 132/86 101/64 130/83  Pulse: 87 92 83 86  Resp: 18 18 17 18   Temp: 99 F (37.2 C) 98.7 F (37.1 C) 98.6 F (37 C) 99.4 F (37.4 C)  TempSrc:      SpO2: 100% 100% 98% 99%  Weight:      Height:        Intake/Output Summary (Last 24 hours) at 04/05/2020 1220 Last data filed at 04/05/2020 0600 Gross per 24 hour  Intake 1181.9 ml  Output -  Net 1181.9 ml    Filed Weights   03/31/20 1150 04/02/20 0500  Weight: 63.5 kg 67.8 kg    Examination:  General exam: Appears calm but uncomfortable   Respiratory system: clear breath sounds b/l  Cardiovascular system: S1/S2+. No rubs or clicks  Gastrointestinal system: Abd is soft, NT, ND & hypoactive bowel sounds  Central nervous system: alert and oriented. Moves all 4 extremities  Psychiatry: Judgement and insight appears normal. Flat mood and affect    Data Reviewed: I have personally reviewed following labs and imaging studies  CBC: Recent Labs  Lab 03/31/20 1356 04/03/20 0823 04/04/20 0933  WBC 6.7 6.0 7.7  NEUTROABS 5.2  --   --   HGB 12.8 11.8* 12.5  HCT 39.9 36.0 37.9  MCV 83.3 81.6 80.8  PLT 137* 149* 185   Basic Metabolic Panel: Recent Labs  Lab 03/31/20 1356 04/02/20 0445 04/03/20 0823 04/04/20 0018 04/04/20 0933  NA 135  --  131*  --  133*  K 4.2  --  3.0*  --  3.8  CL 104  --  99  --  101  CO2 22  --  22  --  23  GLUCOSE 111*  --  179*  --  131*  BUN 9  --  10  --  8  CREATININE 0.54 0.54 0.60 0.69 0.59  CALCIUM 8.5*  --  8.0*  --  8.5*  MG  --   --   --   --  1.9  PHOS  --   --   --   --  2.0*   GFR: Estimated Creatinine Clearance: 89.1 mL/min (by C-G formula based on SCr of 0.59 mg/dL). Liver Function Tests: Recent Labs  Lab 03/31/20 1356  AST 39  ALT 35  ALKPHOS 85  BILITOT 0.7  PROT 7.4  ALBUMIN 3.3*   No results for input(s): LIPASE, AMYLASE in the last 168 hours. No results for input(s): AMMONIA in the last 168 hours. Coagulation Profile: No results for input(s): INR, PROTIME in the last 168 hours. Cardiac Enzymes: No results for input(s): CKTOTAL, CKMB, CKMBINDEX, TROPONINI in the last 168 hours. BNP (last 3 results) No results for input(s): PROBNP in the last 8760 hours. HbA1C: No results for input(s): HGBA1C in the last 72 hours. CBG: No results for input(s): GLUCAP in the last 168 hours. Lipid Profile: No results for input(s):  CHOL, HDL, LDLCALC, TRIG, CHOLHDL, LDLDIRECT in the last 72 hours. Thyroid Function Tests: No results for input(s): TSH, T4TOTAL, FREET4, T3FREE, THYROIDAB in the last 72 hours. Anemia Panel: No results for input(s): VITAMINB12, FOLATE, FERRITIN, TIBC, IRON, RETICCTPCT in the last 72 hours. Sepsis Labs: Recent Labs  Lab 03/31/20 1356  LATICACIDVEN 1.0    Recent Results (from the past 240 hour(s))  Resp Panel by RT-PCR (Flu A&B, Covid) Nasopharyngeal Swab     Status: None   Collection Time: 03/31/20  8:01 PM   Specimen: Nasopharyngeal Swab; Nasopharyngeal(NP) swabs in vial transport medium  Result Value Ref Range Status   SARS Coronavirus 2 by RT PCR NEGATIVE NEGATIVE Final    Comment: (NOTE) SARS-CoV-2 target nucleic acids are NOT DETECTED.  The SARS-CoV-2 RNA is generally detectable in upper respiratory specimens during the acute phase of infection. The lowest concentration of SARS-CoV-2 viral copies this assay can detect is 138 copies/mL. A negative result does not preclude SARS-Cov-2 infection and should not be used as the sole basis for treatment or other patient management decisions. A negative result may occur with  improper specimen collection/handling, submission of specimen other than nasopharyngeal swab, presence of viral mutation(s) within the areas targeted by this assay, and inadequate number of viral copies(<138 copies/mL). A negative result must be combined with clinical observations, patient history, and epidemiological information. The expected result is Negative.  Fact Sheet for Patients:  BloggerCourse.comhttps://www.fda.gov/media/152166/download  Fact Sheet for Healthcare Providers:  SeriousBroker.ithttps://www.fda.gov/media/152162/download  This test is no t yet approved or cleared by the [place] FDA and  has been authorized for detection and/or diagnosis of SARS-CoV-2 by FDA under an Emergency Use Authorization (EUA). This EUA will remain  in effect (meaning this test can be  used) for the duration of the COVID-19 declaration under Section 564(b)(1) of the Act, 21 U.S.C.section 360bbb-3(b)(1), unless the authorization is terminated  or revoked sooner.       Influenza A by PCR NEGATIVE NEGATIVE Final   Influenza B by PCR NEGATIVE NEGATIVE Final    Comment: (NOTE) The Xpert Xpress SARS-CoV-2/FLU/RSV plus assay is intended as an aid in the diagnosis of influenza from Nasopharyngeal swab specimens and should not be used as a sole basis for treatment. Nasal washings and aspirates are unacceptable for Xpert Xpress SARS-CoV-2/FLU/RSV testing.  Fact Sheet for Patients: BloggerCourse.comhttps://www.fda.gov/media/152166/download  Fact Sheet for Healthcare Providers: SeriousBroker.ithttps://www.fda.gov/media/152162/download  This test is not yet approved or cleared by the [place] and has been authorized for detection and/or diagnosis of SARS-CoV-2 by FDA under an Emergency Use Authorization (EUA). This EUA will remain in effect (meaning this test can be used) for the duration of the COVID-19 declaration under Section 564(b)(1) of the Act, 21 U.S.C. section 360bbb-3(b)(1), unless the authorization is terminated or revoked.  Performed at [facility], [address] [place], [place] [address]   Culture, blood (Routine X 2) w Reflex to ID Panel     Status: None   Collection Time: 03/31/20 11:50 PM   Specimen: BLOOD  Result Value Ref Range Status   Specimen Description BLOOD BLOOD LEFT WRIST  Final   Special Requests   Final    BOTTLES DRAWN AEROBIC AND ANAEROBIC Blood Culture adequate volume   Culture   Final    NO GROWTH 5 DAYS Performed at [facility], [address] [place], [place] [address]    Report Status 04/05/2020 FINAL  Final  MRSA PCR Screening     Status: None   Collection Time: 04/02/20 11:40 AM   Specimen: Nasal Mucosa; Nasopharyngeal  Result Value Ref Range Status   MRSA by PCR NEGATIVE NEGATIVE Final    Comment:        The GeneXpert  MRSA Assay (FDA approved for NASAL specimens only), is one component of a comprehensive MRSA colonization surveillance program. It is not intended to diagnose MRSA infection nor to guide or monitor treatment for MRSA infections. Performed at [facility], [address] [place], [place] [address]   Aerobic Culture w Gram Stain (superficial specimen)     Status: None (Preliminary result)   Collection Time: 04/02/20  7:23 PM   Specimen: Hand; Wound  Result Value Ref Range Status   Specimen Description   Final    HAND Performed at [facility], [address], [place], [place] [address]    Special Requests   Final    NONE Performed at [facility], [address] [place], [place] [address]    Gram Stain   Final    MODERATE WBC PRESENT, PREDOMINANTLY PMN RARE GRAM POSITIVE COCCI    Culture   Final    MODERATE STAPHYLOCOCCUS AUREUS SUSCEPTIBILITIES TO FOLLOW Performed at [facility], [address], [place], [place] [address]    Report Status PENDING  Incomplete  Culture, blood (Routine X 2) w Reflex to ID Panel     Status: None (Preliminary result)   Collection Time: 04/02/20 11:06 PM   Specimen: BLOOD  Result Value Ref Range Status   Specimen Description BLOOD RIGHT HAND  Final   Special Requests   Final    BOTTLES DRAWN AEROBIC AND ANAEROBIC Blood Culture adequate volume   Culture   Final    NO GROWTH 3 DAYS Performed at [facility], [address], [place], [place] [address]    Report Status PENDING  Incomplete  Culture, blood (Routine X 2) w Reflex to ID Panel     Status: None (Preliminary result)   Collection Time: 04/02/20 11:06 PM   Specimen: BLOOD  Result Value Ref Range Status   Specimen Description BLOOD LEFT [facility]  Final   Special Requests   Final    BOTTLES DRAWN AEROBIC AND ANAEROBIC Blood Culture adequate volume   Culture   Final    NO GROWTH 3 DAYS Performed at [facility], [address]  [place], [place] [address]    Report Status PENDING  Incomplete         Radiology Studies: No results found.      Scheduled Meds: . folic acid  1 mg Oral Daily  . multivitamin with minerals  1 tablet Oral Daily  . nicotine  21 mg Transdermal Daily  . pantoprazole (PROTONIX) IV  40 mg Intravenous Q24H  . potassium & sodium phosphates  1 packet Oral TID WC & HS  . sodium chloride flush  10-40 mL Intracatheter Q12H  . thiamine  100 mg Oral Daily   Or  . thiamine  100 mg Intravenous Daily   Continuous Infusions: . sodium chloride 250 mL (04/05/20 1115)  . ceFEPime (MAXIPIME) IV 2 g (04/05/20 0545)  . vancomycin 1,000 mg (04/05/20 1117)     LOS: 5 days    Time spent: 30 mins     Charise Killian, MD [facility] Pager [phone]  If 7PM-7AM, please contact night-coverage 04/05/2020, 12:20 PM

## 2020-04-05 NOTE — Progress Notes (Signed)
Referring Physician:  No referring provider defined for this encounter.  Primary Physician:  [facility]  Chief Complaint:  Neck pain  History of Present Illness: 04/04/2020 She has no new symptoms.  04/01/2020 Susan Terry is a 45 y.o. female who presents with the chief complaint of severe neck pain over the past few days.  All movements make her pain worse.  She denies any new neurologic symptoms, weakness, numbness, imbalance, or any other new issues with function of her arms or legs.  She is using IV heroin, last within the last week.  She had a draining wound on her R arm recently, and was concerned that she had an infection.  Review of Systems:  A 10 point review of systems is negative, except for the pertinent positives and negatives detailed in the HPI.  Past Medical History:     Past Medical History:  Diagnosis Date  . Drug abuse (HCC)   . Osteomyelitis [facility])     Past Surgical History:      Past Surgical History:  Procedure Laterality Date  . BACK SURGERY    . INCISION AND DRAINAGE FOREARM / WRIST DEEP Right     Allergies:    Allergies as of 03/31/2020  . (No Known Allergies)    Medications:  Current Facility-Administered Medications:  .  0.9 %  sodium chloride infusion, , Intravenous, Continuous, Gertha Calkin, MD, Last Rate: 100 mL/hr at 04/01/20 0500, New Bag at 04/01/20 0500 .  aspirin EC tablet 81 mg, 81 mg, Oral, Daily, Irena Cords V, MD, 81 mg at 04/01/20 1009 .  ceFEPIme (MAXIPIME) 2 g in sodium chloride 0.9 % 100 mL IVPB, 2 g, Intravenous, Q8H, Lowella Bandy, RPH, Last Rate: 200 mL/hr at 04/01/20 1018, 2 g at 04/01/20 1018 .  folic acid (FOLVITE) tablet 1 mg, 1 mg, Oral, Daily, Standley Brooking, MD, 1 mg at 04/01/20 1009 .  HYDROmorphone (DILAUDID) injection 1 mg, 1 mg, Intravenous, Q2H PRN, Standley Brooking, MD, 1 mg at 04/01/20 1008 .  metroNIDAZOLE (FLAGYL) IVPB 500 mg, 500 mg, Intravenous, Q8H, Irena Cords V, MD, Last  Rate: 100 mL/hr at 04/01/20 0513, 500 mg at 04/01/20 0513 .  multivitamin with minerals tablet 1 tablet, 1 tablet, Oral, Daily, Standley Brooking, MD, 1 tablet at 04/01/20 1009 .  nicotine (NICODERM CQ - dosed in mg/24 hours) patch 21 mg, 21 mg, Transdermal, Daily, Patel, Ekta V, MD .  ondansetron (ZOFRAN) tablet 4 mg, 4 mg, Oral, Q6H PRN **OR** ondansetron (ZOFRAN) injection 4 mg, 4 mg, Intravenous, Q6H PRN, Gertha Calkin, MD, 4 mg at 04/01/20 1014 .  pantoprazole (PROTONIX) injection 40 mg, 40 mg, Intravenous, Q24H, Irena Cords V, MD .  thiamine tablet 100 mg, 100 mg, Oral, Daily, 100 mg at 04/01/20 1009 **OR** thiamine (B-1) injection 100 mg, 100 mg, Intravenous, Daily, Standley Brooking, MD .  vancomycin (VANCOREADY) IVPB 1000 mg/200 mL, 1,000 mg, Intravenous, Q12H, Lowella Bandy, RPH   Social History: Social History        Tobacco Use  . Smoking status: Current Every Day Smoker    Packs/day: 0.50    Types: Cigarettes  . Smokeless tobacco: Never Used  Vaping Use  . Vaping Use: Former  Substance Use Topics  . Alcohol use: No  . Drug use: Not Currently    Types: Methamphetamines, IV    Family Medical History:      Family History  Problem Relation Age of Onset  . Cancer  Mother        cancer throughout family    Physical Examination: See vitals flowsheet  General:          Patient is well developed, well nourished, calm, collected, and in pain. She is not guarding her neck.  Psychiatric:      Patient is non-anxious.  Head:               Pupils equal, round, and reactive to light.  ENT:                Oral mucosa appears well hydrated.  Neck:               She will not move her neck.   Respiratory:     Patient is breathing without any difficulty.  Extremities:     Edema has resolved.  Vascular:         Palpable pulses in dorsal pedal vessels.  Skin:                On exposed skin, there are many abnormal skin lesions consistent with  IVDU.  NEUROLOGICAL:  General: In no acute distress.   Awake, alert, oriented to person, place, and time.  Pupils equal round and reactive to light.  Facial tone is symmetric.  Tongue protrusion is midline.   ROM of spine: guarding - will not move neck.   Strength: Side Biceps Triceps Deltoid Interossei Grip Wrist Ext. Wrist Flex.  R 5 5 5 5 5 5 5   L 5 5 5 5 5 5 5    Side Iliopsoas Quads Hamstring PF DF EHL  R 5 5 5 5 5 5   L 5 5 5 5 5 5     Bilateral upper and lower extremity sensation is intact to light touch. Reflexes are 1+ and symmetric at the biceps, triceps, brachioradialis, patella and achilles. Hoffman's is absent.  Clonus is not present.  Toes are down-going.    Gait is untested due to pain.   Imaging: MRI C spine 03/31/2020 IMPRESSION: Abnormal appearance of the C5-6 level is most consistent with the patient's prior history of discitis and osteomyelitis. There is mild edema in the endplates and some prevertebral fluid worrisome for active infection. Postcontrast imaging of the cervical spine is recommended for further evaluation.  Mild kyphosis centered about the C5-6 level and a broad-based disc bulge result in flattening of the cord.   Electronically Signed By: M.D. On: 03/31/2020 16:48 I have personally reviewed the images and agree with the above interpretation.  Labs: CBC Latest Ref Rng & Units 03/31/2020 11/22/2018 04/05/2017  WBC 4.0 - 10.5 K/uL 6.7 12.0(H) 8.2  Hemoglobin 12.0 - 15.0 g/dL 05/31/2020 11.4(L) 12.7  Hematocrit 36.0 - 46.0 % 39.9 36.1 38.7  Platelets 150 - 400 K/uL 137(L) 223 187       Assessment and Plan: Susan Terry is a pleasant 45 y.o. 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 She does not need acute surgery.  We discussed whether to proceed with biopsy of her disc.  I discussed that she may have a nondiagnostic biopsy due to her being on antibiotics.  She expressed that she would not  wish to have surgery for diagnostic purposes unless there was a high chance of getting a positive biopsy. I could not gaurantee that, so she declined biopsy.  She may develop kyphosis over time, or myelopathy or radiculopathy due to compression.  If her symptoms change, I would likely offer her surgery at  that time.   I will sign off for now. If her symptoms change, please contact me or my partner Dr. Adriana Simas for reevaluation.   Susan Terry K. Myer Haff MD, MPHS Dept. of Neurosurgery

## 2020-04-05 NOTE — TOC Initial Note (Signed)
Transition of Care [facility]) - Initial/Assessment Note    Patient Details  Name: Susan Terry MRN: [id number] Date of Birth: 11-13-1975  [facility] CM/SW Contact:    Magnus Ivan, LCSW Phone Number: 04/05/2020, 2:42 PM  Clinical Narrative:                [facility] consult for substance abuse resources. CSW met with patient at bedside. Patient lives with her parents. Patient reported she plans to use [facility] for PCP and Pharmacy since she does not have insurance. Patient refused SA resources. Patient denied additional needs at this time.   Expected Discharge Plan: Home/Self Care Barriers to Discharge: Continued Medical Work up   Patient Goals and CMS Choice Patient states their goals for this hospitalization and ongoing recovery are:: to return home CMS Medicare.gov Compare Post Acute Care list provided to:: Patient Choice offered to / list presented to : Patient  Expected Discharge Plan and Services Expected Discharge Plan: Home/Self Care                                              Prior Living Arrangements/Services     Patient language and need for interpreter reviewed:: Yes Do you feel safe going back to the place where you live?: Yes      Need for Family Participation in Patient Care: Yes (Comment) Care giver support system in place?: Yes (comment)   Criminal Activity/Legal Involvement Pertinent to Current Situation/Hospitalization: No - Comment as needed  Activities of Daily Living Home Assistive Devices/Equipment: None ADL Screening (condition at time of admission) Patient's cognitive ability adequate to safely complete daily activities?: No Is the patient deaf or have difficulty hearing?: No Does the patient have difficulty seeing, even when wearing glasses/contacts?: No Does the patient have difficulty concentrating, remembering, or making decisions?: No Patient able to express need for assistance with ADLs?: No Does the patient  have difficulty dressing or bathing?: No Independently performs ADLs?: No Communication: Independent Dressing (OT): Independent Grooming: Independent Feeding: Independent Bathing: Independent Toileting: Independent In/Out Bed: Independent Walks in Home: Independent Does the patient have difficulty walking or climbing stairs?: No Weakness of Legs: None Weakness of Arms/Hands: None  Permission Sought/Granted                  Emotional Assessment       Orientation: : Oriented to Self,Oriented to Place,Oriented to  Time,Oriented to [place] Orientation (Suspected and/or reported Sundowners) Alcohol / Substance Use: Illicit Drugs Psych Involvement: No (comment)  Admission diagnosis:  Discitis of cervical region [M46.42] Osteomyelitis of cervical spine [facility]) [M46.22] Patient Active Problem List   Diagnosis Date Noted  . Osteomyelitis of vertebra of cervical region ([place]) 04/01/2020  . IVDU (intravenous drug user) 04/01/2020  . Heroin abuse ([place]) 04/01/2020  . Pain in neck 04/01/2020  . Discitis of cervical region 03/31/2020  . Tobacco use disorder 07/20/2018  . Abscess of right forearm   . Cellulitis 04/06/2017   PCP:  [facility] Pharmacy:   [place], [place] - [place] [address] Phone: [phone] Fax: [phone]  [place] [address]), [place] - [place]) [place] [address] Phone: [phone] Fax: [phone]     Social Determinants of Health (SDOH) Interventions  Readmission Risk Interventions No flowsheet data found.

## 2020-04-06 DIAGNOSIS — D72828 Other elevated white blood cell count: Secondary | ICD-10-CM

## 2020-04-06 LAB — BASIC METABOLIC PANEL
Anion gap: 8 (ref 5–15)
BUN: 7 mg/dL (ref 6–20)
CO2: 24 mmol/L (ref 22–32)
Calcium: 8.4 mg/dL — ABNORMAL LOW (ref 8.9–10.3)
Chloride: 100 mmol/L (ref 98–111)
Creatinine, Ser: 0.52 mg/dL (ref 0.44–1.00)
GFR, Estimated: >60 mL/min (ref >60)
Glucose, Bld: 115 mg/dL — ABNORMAL HIGH (ref 70–99)
Potassium: 3.6 mmol/L (ref 3.5–5.1)
Sodium: 132 mmol/L — ABNORMAL LOW (ref 135–145)

## 2020-04-06 LAB — CBC
HCT: 35.7 % — ABNORMAL LOW (ref 36.0–46.0)
Hemoglobin: 11.9 g/dL — ABNORMAL LOW (ref 12.0–15.0)
MCH: 26.5 pg (ref 26.0–34.0)
MCHC: 33.3 g/dL (ref 30.0–36.0)
MCV: 79.5 fL — ABNORMAL LOW (ref 80.0–100.0)
Platelets: 277 10*3/uL (ref 150–400)
RBC: 4.49 MIL/uL (ref 3.87–5.11)
RDW: 14.7 % (ref 11.5–15.5)
WBC: 10.6 10*3/uL — ABNORMAL HIGH (ref 4.0–10.5)
nRBC: 0 % (ref 0.0–0.2)

## 2020-04-06 LAB — PHOSPHORUS: Phosphorus: 3 mg/dL (ref 2.5–4.6)

## 2020-04-06 LAB — C-REACTIVE PROTEIN: CRP: 4.2 mg/dL — ABNORMAL HIGH (ref <1.0)

## 2020-04-06 LAB — CK: Total CK: 13 U/L — ABNORMAL LOW (ref 38–234)

## 2020-04-06 NOTE — Progress Notes (Signed)
PROGRESS NOTE    Susan Terry  QQV:[id number] DOB: 03-Feb-1975 DOA: 03/31/2020 [facility]   Assessment & Plan:   Principal Problem:   Osteomyelitis of vertebra of cervical region [facility]) Active Problems:   Discitis of cervical region   Tobacco use disorder   IVDU (intravenous drug user)   Heroin abuse (HCC)   Pain in neck   Acute discitis & osteomyelitis of C5-6: secondary to IVDU. Still w/ pain. Continue on IV cefepime, daptomycin x 6 weeks as per ID. No acute surgery needed as per neurosurg. Echo did not show any vegetations. Pt has no insurance so pt will not be able to go to CIR or SNF for long term abx.  Mild cellulitis right hand: continue on IV daptomycin, cefepime as per ID  Leukocytosis: secondary to above infection. Continue on IV abxs    IVDU: last heroin use was just before admission. Illicit drug use cessation counseling  Hypophosphatemia: WNL today   DVT prophylaxis: SCDs Code Status: full Family Communication:  Disposition Plan: possibly back home   Level of care: Med-Surg   Status is: Inpatient  Remains inpatient appropriate because:Unsafe d/c plan, IV treatments appropriate due to intensity of illness or inability to take PO and Inpatient level of care appropriate due to severity of illness   Dispo: The patient is from: Home              Anticipated d/c is to: Home              Patient currently is not medically stable to d/c.   Difficult to place patient Yes     Consultants:   neurosurg  ID   Procedures   Antimicrobials: vanco, cefepime    Subjective: Pt still c/o significant neck pain   Objective: Vitals:   04/06/20 0132 04/06/20 0546 04/06/20 0743 04/06/20 1128  BP: (!) 145/81 140/76 130/76 (!) 148/75  Pulse: 84 92 97 95  Resp: 18 18 17 17   Temp: 98.5 F (36.9 C) 99.1 F (37.3 C) 98.6 F (37 C) 97.7 F (36.5 C)  TempSrc: Oral Oral    SpO2: 99% 99% 99% 100%  Weight:      Height:        Intake/Output Summary  (Last 24 hours) at 04/06/2020 1340 Last data filed at 04/06/2020 1013 Gross per 24 hour  Intake 619.98 ml  Output --  Net 619.98 ml   Filed Weights   03/31/20 1150 04/02/20 0500  Weight: 63.5 kg 67.8 kg    Examination:  General exam: Appears frustrated and agitated   Respiratory system: clear breath sounds b/l. No wheezes Cardiovascular system: S1/S2+. No gallops or clicks   Gastrointestinal system: Abd is soft, NT, ND & normal bowel sounds  Central nervous system: Alert and oriented. Moves all 4 extremities   Psychiatry: Judgement and insight appear normal. Agitated and frustrated    Data Reviewed: I have personally reviewed following labs and imaging studies  CBC: Recent Labs  Lab 03/31/20 1356 04/03/20 0823 04/04/20 0933 04/06/20 0810  WBC 6.7 6.0 7.7 10.6*  NEUTROABS 5.2  --   --   --   HGB 12.8 11.8* 12.5 11.9*  HCT 39.9 36.0 37.9 35.7*  MCV 83.3 81.6 80.8 79.5*  PLT 137* 149* 185 277   Basic Metabolic Panel: Recent Labs  Lab 03/31/20 1356 04/02/20 0445 04/03/20 0823 04/04/20 0018 04/04/20 0933 04/06/20 0810  NA 135  --  131*  --  133* 132*  K 4.2  --  3.0*  --  3.8 3.6  CL 104  --  99  --  101 100  CO2 22  --  22  --  23 24  GLUCOSE 111*  --  179*  --  131* 115*  BUN 9  --  10  --  8 7  CREATININE 0.54 0.54 0.60 0.69 0.59 0.52  CALCIUM 8.5*  --  8.0*  --  8.5* 8.4*  MG  --   --   --   --  1.9  --   PHOS  --   --   --   --  2.0* 3.0   GFR: Estimated Creatinine Clearance: 89.1 mL/min (by C-G formula based on SCr of 0.52 mg/dL). Liver Function Tests: Recent Labs  Lab 03/31/20 1356  AST 39  ALT 35  ALKPHOS 85  BILITOT 0.7  PROT 7.4  ALBUMIN 3.3*   No results for input(s): LIPASE, AMYLASE in the last 168 hours. No results for input(s): AMMONIA in the last 168 hours. Coagulation Profile: No results for input(s): INR, PROTIME in the last 168 hours. Cardiac Enzymes: Recent Labs  Lab 04/06/20 0810  CKTOTAL 13*   BNP (last 3 results) No  results for input(s): PROBNP in the last 8760 hours. HbA1C: No results for input(s): HGBA1C in the last 72 hours. CBG: No results for input(s): GLUCAP in the last 168 hours. Lipid Profile: No results for input(s): CHOL, HDL, LDLCALC, TRIG, CHOLHDL, LDLDIRECT in the last 72 hours. Thyroid Function Tests: No results for input(s): TSH, T4TOTAL, FREET4, T3FREE, THYROIDAB in the last 72 hours. Anemia Panel: No results for input(s): VITAMINB12, FOLATE, FERRITIN, TIBC, IRON, RETICCTPCT in the last 72 hours. Sepsis Labs: Recent Labs  Lab 03/31/20 1356  LATICACIDVEN 1.0    Recent Results (from the past 240 hour(s))  Resp Panel by RT-PCR (Flu A&B, Covid) Nasopharyngeal Swab     Status: None   Collection Time: 03/31/20  8:01 PM   Specimen: Nasopharyngeal Swab; Nasopharyngeal(NP) swabs in vial transport medium  Result Value Ref Range Status   SARS Coronavirus 2 by RT PCR NEGATIVE NEGATIVE Final    Comment: (NOTE) SARS-CoV-2 target nucleic acids are NOT DETECTED.  The SARS-CoV-2 RNA is generally detectable in upper respiratory specimens during the acute phase of infection. The lowest concentration of SARS-CoV-2 viral copies this assay can detect is 138 copies/mL. A negative result does not preclude SARS-Cov-2 infection and should not be used as the sole basis for treatment or other patient management decisions. A negative result may occur with  improper specimen collection/handling, submission of specimen other than nasopharyngeal swab, presence of viral mutation(s) within the areas targeted by this assay, and inadequate number of viral copies(<138 copies/mL). A negative result must be combined with clinical observations, patient history, and epidemiological information. The expected result is Negative.  Fact Sheet for Patients:  BloggerCourse.com  Fact Sheet for Healthcare Providers:  SeriousBroker.it  This test is no t yet approved  or cleared by the [place] FDA and  has been authorized for detection and/or diagnosis of SARS-CoV-2 by FDA under an Emergency Use Authorization (EUA). This EUA will remain  in effect (meaning this test can be used) for the duration of the COVID-19 declaration under Section 564(b)(1) of the Act, 21 U.S.C.section 360bbb-3(b)(1), unless the authorization is terminated  or revoked sooner.       Influenza A by PCR NEGATIVE NEGATIVE Final   Influenza B by PCR NEGATIVE NEGATIVE Final    Comment: (NOTE) The Xpert  Xpress SARS-CoV-2/FLU/RSV plus assay is intended as an aid in the diagnosis of influenza from Nasopharyngeal swab specimens and should not be used as a sole basis for treatment. Nasal washings and aspirates are unacceptable for Xpert Xpress SARS-CoV-2/FLU/RSV testing.  Fact Sheet for Patients: BloggerCourse.com  Fact Sheet for Healthcare Providers: SeriousBroker.it  This test is not yet approved or cleared by the [place] FDA and has been authorized for detection and/or diagnosis of SARS-CoV-2 by FDA under an Emergency Use Authorization (EUA). This EUA will remain in effect (meaning this test can be used) for the duration of the COVID-19 declaration under Section 564(b)(1) of the Act, 21 U.S.C. section 360bbb-3(b)(1), unless the authorization is terminated or revoked.  Performed at [facility], [address] [place], [place] [address]   Culture, blood (Routine X 2) w Reflex to ID Panel     Status: None   Collection Time: 03/31/20 11:50 PM   Specimen: BLOOD  Result Value Ref Range Status   Specimen Description BLOOD BLOOD LEFT WRIST  Final   Special Requests   Final    BOTTLES DRAWN AEROBIC AND ANAEROBIC Blood Culture adequate volume   Culture   Final    NO GROWTH 5 DAYS Performed at [facility], [address] [place], [place] [address]    Report Status 04/05/2020 FINAL  Final  MRSA  PCR Screening     Status: None   Collection Time: 04/02/20 11:40 AM   Specimen: Nasal Mucosa; Nasopharyngeal  Result Value Ref Range Status   MRSA by PCR NEGATIVE NEGATIVE Final    Comment:        The GeneXpert MRSA Assay (FDA approved for NASAL specimens only), is one component of a comprehensive MRSA colonization surveillance program. It is not intended to diagnose MRSA infection nor to guide or monitor treatment for MRSA infections. Performed at [facility], [address], [place], [place] [address]   Aerobic Culture w Gram Stain (superficial specimen)     Status: None   Collection Time: 04/02/20  7:23 PM   Specimen: Hand; Wound  Result Value Ref Range Status   Specimen Description   Final    HAND Performed at [facility], [address] [place], [place] [address]    Special Requests   Final    NONE Performed at [facility], [address] [place], [place] [address]    Gram Stain   Final    MODERATE WBC PRESENT, PREDOMINANTLY PMN RARE GRAM POSITIVE COCCI Performed at [facility], [address], [place], [place] [address]    Culture   Final    MODERATE METHICILLIN RESISTANT STAPHYLOCOCCUS AUREUS   Report Status 04/05/2020 FINAL  Final   Organism ID, Bacteria METHICILLIN RESISTANT STAPHYLOCOCCUS AUREUS  Final      Susceptibility   Methicillin resistant staphylococcus aureus - MIC*    CIPROFLOXACIN >=8 RESISTANT Resistant     ERYTHROMYCIN >=8 RESISTANT Resistant     GENTAMICIN >=16 RESISTANT Resistant     OXACILLIN >=4 RESISTANT Resistant     TETRACYCLINE >=16 RESISTANT Resistant     VANCOMYCIN 1 SENSITIVE Sensitive     TRIMETH/SULFA >=320 RESISTANT Resistant     CLINDAMYCIN >=8 RESISTANT Resistant     RIFAMPIN <=0.5 SENSITIVE Sensitive     Inducible Clindamycin NEGATIVE Sensitive     * MODERATE METHICILLIN RESISTANT STAPHYLOCOCCUS AUREUS  Culture, blood (Routine X 2) w Reflex to ID Panel     Status: None (Preliminary  result)   Collection Time: 04/02/20 11:06 PM   Specimen: BLOOD  Result Value Ref Range Status   Specimen Description BLOOD RIGHT HAND  Final   Special Requests   Final    BOTTLES DRAWN AEROBIC AND ANAEROBIC Blood Culture adequate volume   Culture   Final    NO GROWTH 4 DAYS Performed at [facility], [address] [place], [place] [address]    Report Status PENDING  Incomplete  Culture, blood (Routine X 2) w Reflex to ID Panel     Status: None (Preliminary result)   Collection Time: 04/02/20 11:06 PM   Specimen: BLOOD  Result Value Ref Range Status   [facility]  Final   Special Requests   Final    BOTTLES DRAWN AEROBIC AND ANAEROBIC Blood Culture adequate volume   Culture   Final    NO GROWTH 4 DAYS Performed at [facility], [address] [place], [place] [address]    Report Status PENDING  Incomplete         Radiology Studies: No results found.      Scheduled Meds: . diclofenac  1 patch Transdermal BID  . folic acid  1 mg Oral Daily  . multivitamin with minerals  1 tablet Oral Daily  . nicotine  21 mg Transdermal Daily  . pantoprazole (PROTONIX) IV  40 mg Intravenous Q24H  . sodium chloride flush  10-40 mL Intracatheter Q12H  . thiamine  100 mg Oral Daily   Or  . thiamine  100 mg Intravenous Daily   Continuous Infusions: . sodium chloride 250 mL (04/06/20 [id number])  . ceFEPime (MAXIPIME) IV 2 g (04/06/20 [id number])  . DAPTOmycin (CUBICIN)  IV 550 mg (04/05/20 1834)     LOS: 6 days    Time spent: 30 mins     Charise KillianJamiese M Jennet Scroggin, MD [facility] Pager [phone]  If 7PM-7AM, please contact night-coverage 04/06/2020, 1:40 PM

## 2020-04-06 NOTE — Progress Notes (Signed)
ID  Pt is more calm today Says neck pain is controlled with ice pack No fever Says since vancomycin was discontinued the burning and pain was better  Patient Vitals for the past 24 hrs:  BP Temp Temp src Pulse Resp SpO2  04/06/20 1128 (!) 148/75 97.7 F (36.5 C) - 95 17 100 %  04/06/20 0743 130/76 98.6 F (37 C) - 97 17 99 %  04/06/20 0546 140/76 99.1 F (37.3 C) Oral 92 18 99 %  04/06/20 0132 (!) 145/81 98.5 F (36.9 C) Oral 84 18 99 %  04/05/20 2012 136/82 98.1 F (36.7 C) Oral 86 18 100 %  04/05/20 1520 138/71 98.8 F (37.1 C) - 86 17 99 %   O/e awake and alert Lying with icepack around her neck Chest b/l Air entry [id number] abd soft Cns non focal   Labs CBC Latest Ref Rng & Units 04/06/2020 04/04/2020 04/03/2020  WBC 4.0 - 10.5 K/uL 10.6(H) 7.7 6.0  Hemoglobin 12.0 - 15.0 g/dL 11.9(L) 12.5 11.8(L)  Hematocrit 36.0 - 46.0 % 35.7(L) 37.9 36.0  Platelets 150 - 400 K/uL 277 185 149(L)    CMP Latest Ref Rng & Units 04/06/2020 04/04/2020 04/04/2020  Glucose 70 - 99 mg/dL 937(T) 024(O) -  BUN 6 - 20 mg/dL 7 8 -  Creatinine 9.73 - 1.00 mg/dL 5.32 9.92 4.26  Sodium 135 - 145 mmol/L 132(L) 133(L) -  Potassium 3.5 - 5.1 mmol/L 3.6 3.8 -  Chloride 98 - 111 mmol/L 100 101 -  CO2 22 - 32 mmol/L 24 23 -  Calcium 8.9 - 10.3 mg/dL 8.3(M) 1.9(Q) -  Total Protein 6.5 - 8.1 g/dL - - -  Total Bilirubin 0.3 - 1.2 mg/dL - - -  Alkaline Phos 38 - 126 U/L - - -  AST 15 - 41 U/L - - -  ALT 0 - 44 U/L - - -   Micro  03/31/2020 blood culture no growth 04/02/2020 blood culture no growth 04/02/2020 right arm wound culture is MRSA  Impression/recommendation 45 yr old female with history of IVDA, presents with cervical pain and fever  Cervical vertebral osteomyelitis and discitis involving C5-C6 with epidural phlegmon extending up to C1.  Patient currently on cefepime and daptomycin.  She was on vancomycin which has been changed to daptomycin at her insistence because she thought vancomycin was  causing burning and increasing pain.  Daptomycin is not the first line of therapy for MRSA epidural abscess.  Bioavailability in the CSF space may be diminished.  Currently on 8 mg/kg body weight.  Explained this to the patient and she is willing to try vancomycin again early next week.  We will get cervical vertebra MRI early next week. She needs 6 weeks of IV antibiotics..  Explained this to her as well. Blood culture is negative.  One of the wound culture from the I needle site had MRSA.  But there is no guarantee that is what is causing her epidural abscess.  So hence she is being covered both for MRSA as well as gram-negative rods.  History of lumbar discitis and osteomyelitis.  Leading to surgery and fusion.  History of MRSA infection  History of Serratia infection  Discussed the management with neurosurgery and hospitalist.

## 2020-04-06 NOTE — Progress Notes (Signed)
Patient has refused her 2200 dose of Maxipime. She reports she is in too much pain currently and needs a break. She reports she will call when she is ready.

## 2020-04-07 LAB — BASIC METABOLIC PANEL
Anion gap: 9 (ref 5–15)
BUN: 10 mg/dL (ref 6–20)
CO2: 27 mmol/L (ref 22–32)
Calcium: 9.1 mg/dL (ref 8.9–10.3)
Chloride: 97 mmol/L — ABNORMAL LOW (ref 98–111)
Creatinine, Ser: 0.81 mg/dL (ref 0.44–1.00)
GFR, Estimated: >60 mL/min (ref >60)
Glucose, Bld: 111 mg/dL — ABNORMAL HIGH (ref 70–99)
Potassium: 4.5 mmol/L (ref 3.5–5.1)
Sodium: 133 mmol/L — ABNORMAL LOW (ref 135–145)

## 2020-04-07 LAB — CBC
HCT: 39.6 % (ref 36.0–46.0)
Hemoglobin: 13.2 g/dL (ref 12.0–15.0)
MCH: 26.5 pg (ref 26.0–34.0)
MCHC: 33.3 g/dL (ref 30.0–36.0)
MCV: 79.5 fL — ABNORMAL LOW (ref 80.0–100.0)
Platelets: 302 10*3/uL (ref 150–400)
RBC: 4.98 MIL/uL (ref 3.87–5.11)
RDW: 14.5 % (ref 11.5–15.5)
WBC: 11.2 10*3/uL — ABNORMAL HIGH (ref 4.0–10.5)
nRBC: 0 % (ref 0.0–0.2)

## 2020-04-07 LAB — CULTURE, BLOOD (ROUTINE X 2)
Culture: NO GROWTH
Culture: NO GROWTH
Special Requests: ADEQUATE
Special Requests: ADEQUATE

## 2020-04-07 LAB — C-REACTIVE PROTEIN: CRP: 3.2 mg/dL — ABNORMAL HIGH (ref <1.0)

## 2020-04-07 NOTE — Progress Notes (Signed)
Patient requests Dilaudid and Ativan simultaneously. Administered Dilaudid and informed patient she will have to wait for Ativan. She reports taking it frequently at home with other meds. Nurse advised she will come back within to an hour.

## 2020-04-07 NOTE — Progress Notes (Signed)
PROGRESS NOTE    Susan Terry  QMV:[id number] DOB: 1975-10-30 DOA: 03/31/2020 [facility]   Assessment & Plan:   Principal Problem:   Osteomyelitis of vertebra of cervical region [facility](HCC) Active Problems:   Discitis of cervical region   Tobacco use disorder   IVDU (intravenous drug user)   Heroin abuse (HCC)   Pain in neck   Acute discitis & osteomyelitis of C5-6: secondary to IVDU. Did not c/o pain this morning. Continue on IV cefepime, daptomycin x 6 weeks as per ID. No acute surgery needed as per neurosurg. Echo did not show any vegetations. Pt has no insurance so pt will not be able to go to CIR or SNF for long term abx.  Mild cellulitis right hand: continue on IV abxs as per ID  Leukocytosis: labile & secondary to above infection. Continue on IV abxs   IVDU: last heroin use was just before admission. Illicit drug use cessation counseling  Hypophosphatemia: resolved    Hyponatremia: labile. Will continue to monitor    DVT prophylaxis: SCDs Code Status: full Family Communication:  Disposition Plan: possibly back home   Level of care: Med-Surg   Status is: Inpatient  Remains inpatient appropriate because:Unsafe d/c plan, IV treatments appropriate due to intensity of illness or inability to take PO and Inpatient level of care appropriate due to severity of illness   Dispo: The patient is from: Home              Anticipated d/c is to: Home              Patient currently is not medically stable to d/c.   Difficult to place patient Yes     Consultants:   neurosurg  ID   Procedures   Antimicrobials: dapto, cefepime    Subjective: Pt c/o fatigue   Objective: Vitals:   04/06/20 1533 04/06/20 1955 04/07/20 0435 04/07/20 0814  BP: 132/71 137/90 139/77 135/73  Pulse: 81 90 81 89  Resp: 17   18  Temp: 97.7 F (36.5 C) 97.6 F (36.4 C)  98.4 F (36.9 C)  TempSrc:      SpO2: 100%   98%  Weight:      Height:        Intake/Output Summary  (Last 24 hours) at 04/07/2020 1134 Last data filed at 04/06/2020 1800 Gross per 24 hour  Intake 328.05 ml  Output --  Net 328.05 ml   Filed Weights   03/31/20 1150 04/02/20 0500  Weight: 63.5 kg 67.8 kg    Examination:  General exam: Appears lethargic  Respiratory system: clear breath sounds b/l  Cardiovascular system: S1&S2+. No rubs or clicks  Gastrointestinal system: Abd is soft, NT, ND & hypoactive bowel sounds   Central nervous system: Lethargic. Moves all extremities  Psychiatry: Judgement and insight appear normal. Flat mood and affect     Data Reviewed: I have personally reviewed following labs and imaging studies  CBC: Recent Labs  Lab 03/31/20 1356 04/03/20 0823 04/04/20 0933 04/06/20 0810 04/07/20 0725  WBC 6.7 6.0 7.7 10.6* 11.2*  NEUTROABS 5.2  --   --   --   --   HGB 12.8 11.8* 12.5 11.9* 13.2  HCT 39.9 36.0 37.9 35.7* 39.6  MCV 83.3 81.6 80.8 79.5* 79.5*  PLT 137* 149* 185 277 302   Basic Metabolic Panel: Recent Labs  Lab 03/31/20 1356 04/02/20 0445 04/03/20 0823 04/04/20 0018 04/04/20 0933 04/06/20 0810 04/07/20 0725  NA 135  --  131*  --  133* 132* 133*  K 4.2  --  3.0*  --  3.8 3.6 4.5  CL 104  --  99  --  101 100 97*  CO2 22  --  22  --  23 24 27   GLUCOSE 111*  --  179*  --  131* 115* 111*  BUN 9  --  10  --  8 7 10   CREATININE 0.54   < > 0.60 0.69 0.59 0.52 0.81  CALCIUM 8.5*  --  8.0*  --  8.5* 8.4* 9.1  MG  --   --   --   --  1.9  --   --   PHOS  --   --   --   --  2.0* 3.0  --    < > = values in this interval not displayed.   GFR: Estimated Creatinine Clearance: 88 mL/min (by C-G formula based on SCr of 0.81 mg/dL). Liver Function Tests: Recent Labs  Lab 03/31/20 1356  AST 39  ALT 35  ALKPHOS 85  BILITOT 0.7  PROT 7.4  ALBUMIN 3.3*   No results for input(s): LIPASE, AMYLASE in the last 168 hours. No results for input(s): AMMONIA in the last 168 hours. Coagulation Profile: No results for input(s): INR, PROTIME in the  last 168 hours. Cardiac Enzymes: Recent Labs  Lab 04/06/20 0810  CKTOTAL 13*   BNP (last 3 results) No results for input(s): PROBNP in the last 8760 hours. HbA1C: No results for input(s): HGBA1C in the last 72 hours. CBG: No results for input(s): GLUCAP in the last 168 hours. Lipid Profile: No results for input(s): CHOL, HDL, LDLCALC, TRIG, CHOLHDL, LDLDIRECT in the last 72 hours. Thyroid Function Tests: No results for input(s): TSH, T4TOTAL, FREET4, T3FREE, THYROIDAB in the last 72 hours. Anemia Panel: No results for input(s): VITAMINB12, FOLATE, FERRITIN, TIBC, IRON, RETICCTPCT in the last 72 hours. Sepsis Labs: Recent Labs  Lab 03/31/20 1356  LATICACIDVEN 1.0    Recent Results (from the past 240 hour(s))  Resp Panel by RT-PCR (Flu A&B, Covid) Nasopharyngeal Swab     Status: None   Collection Time: 03/31/20  8:01 PM   Specimen: Nasopharyngeal Swab; Nasopharyngeal(NP) swabs in vial transport medium  Result Value Ref Range Status   SARS Coronavirus 2 by RT PCR NEGATIVE NEGATIVE Final    Comment: (NOTE) SARS-CoV-2 target nucleic acids are NOT DETECTED.  The SARS-CoV-2 RNA is generally detectable in upper respiratory specimens during the acute phase of infection. The lowest concentration of SARS-CoV-2 viral copies this assay can detect is 138 copies/mL. A negative result does not preclude SARS-Cov-2 infection and should not be used as the sole basis for treatment or other patient management decisions. A negative result may occur with  improper specimen collection/handling, submission of specimen other than nasopharyngeal swab, presence of viral mutation(s) within the areas targeted by this assay, and inadequate number of viral copies(<138 copies/mL). A negative result must be combined with clinical observations, patient history, and epidemiological information. The expected result is Negative.  Fact Sheet for Patients:  05/31/20  Fact  Sheet for Healthcare Providers:  05/31/20  This test is no t yet approved or cleared by the [employer] FDA and  has been authorized for detection and/or diagnosis of SARS-CoV-2 by FDA under an Emergency Use Authorization (EUA). This EUA will remain  in effect (meaning this test can be used) for the duration of the COVID-19 declaration under Section 564(b)(1) of the Act, 21 U.S.C.section 360bbb-3(b)(1), unless the  authorization is terminated  or revoked sooner.       Influenza A by PCR NEGATIVE NEGATIVE Final   Influenza B by PCR NEGATIVE NEGATIVE Final    Comment: (NOTE) The Xpert Xpress SARS-CoV-2/FLU/RSV plus assay is intended as an aid in the diagnosis of influenza from Nasopharyngeal swab specimens and should not be used as a sole basis for treatment. Nasal washings and aspirates are unacceptable for Xpert Xpress SARS-CoV-2/FLU/RSV testing.  Fact Sheet for Patients: BloggerCourse.com  Fact Sheet for Healthcare Providers: SeriousBroker.it  This test is not yet approved or cleared by the [place] FDA and has been authorized for detection and/or diagnosis of SARS-CoV-2 by FDA under an Emergency Use Authorization (EUA). This EUA will remain in effect (meaning this test can be used) for the duration of the COVID-19 declaration under Section 564(b)(1) of the Act, 21 U.S.C. section 360bbb-3(b)(1), unless the authorization is terminated or revoked.  Performed at [facility], [address] [place], [place] [address]   Culture, blood (Routine X 2) w Reflex to ID Panel     Status: None   Collection Time: 03/31/20 11:50 PM   Specimen: BLOOD  Result Value Ref Range Status   Specimen Description BLOOD BLOOD LEFT WRIST  Final   Special Requests   Final    BOTTLES DRAWN AEROBIC AND ANAEROBIC Blood Culture adequate volume   Culture   Final    NO GROWTH 5 DAYS Performed at [facility], [address] [place], [place] [address]    Report Status 04/05/2020 FINAL  Final  MRSA PCR Screening     Status: None   Collection Time: 04/02/20 11:40 AM   Specimen: Nasal Mucosa; Nasopharyngeal  Result Value Ref Range Status   MRSA by PCR NEGATIVE NEGATIVE Final    Comment:        The GeneXpert MRSA Assay (FDA approved for NASAL specimens only), is one component of a comprehensive MRSA colonization surveillance program. It is not intended to diagnose MRSA infection nor to guide or monitor treatment for MRSA infections. Performed at [facility], [address] [place], [place] [address]   Aerobic Culture w Gram Stain (superficial specimen)     Status: None   Collection Time: 04/02/20  7:23 PM   Specimen: Hand; Wound  Result Value Ref Range Status   Specimen Description   Final    HAND Performed at [facility], [address], [place], [place] [address]    Special Requests   Final    NONE Performed at [facility], [address] [place], [place] [address]    Gram Stain   Final    MODERATE WBC PRESENT, PREDOMINANTLY PMN RARE GRAM POSITIVE COCCI Performed at [facility], [address] [place], [place] [address]    Culture   Final    MODERATE METHICILLIN RESISTANT STAPHYLOCOCCUS AUREUS   Report Status 04/05/2020 FINAL  Final   Organism ID, Bacteria METHICILLIN RESISTANT STAPHYLOCOCCUS AUREUS  Final      Susceptibility   Methicillin resistant staphylococcus aureus - MIC*    CIPROFLOXACIN >=8 RESISTANT Resistant     ERYTHROMYCIN >=8 RESISTANT Resistant     GENTAMICIN >=16 RESISTANT Resistant     OXACILLIN >=4 RESISTANT Resistant     TETRACYCLINE >=16 RESISTANT Resistant     VANCOMYCIN 1 SENSITIVE Sensitive     TRIMETH/SULFA >=320 RESISTANT Resistant     CLINDAMYCIN >=8 RESISTANT Resistant     RIFAMPIN <=0.5 SENSITIVE Sensitive  Inducible Clindamycin NEGATIVE Sensitive     * MODERATE METHICILLIN RESISTANT  STAPHYLOCOCCUS AUREUS  Culture, blood (Routine X 2) w Reflex to ID Panel     Status: None   Collection Time: 04/02/20 11:06 PM   Specimen: BLOOD  Result Value Ref Range Status   Specimen Description BLOOD RIGHT HAND  Final   Special Requests   Final    BOTTLES DRAWN AEROBIC AND ANAEROBIC Blood Culture adequate volume   Culture   Final    NO GROWTH 5 DAYS Performed at [facility], [address], [place], [place] [address]    Report Status 04/07/2020 FINAL  Final  Culture, blood (Routine X 2) w Reflex to ID Panel     Status: None   Collection Time: 04/02/20 11:06 PM   Specimen: BLOOD  Result Value Ref Range Status   Specimen Description BLOOD LEFT [facility]  Final   Special Requests   Final    BOTTLES DRAWN AEROBIC AND ANAEROBIC Blood Culture adequate volume   Culture   Final    NO GROWTH 5 DAYS Performed at [facility], [address], [place], [place] [address]    Report Status 04/07/2020 FINAL  Final         Radiology Studies: No results found.      Scheduled Meds: . diclofenac  1 patch Transdermal BID  . folic acid  1 mg Oral Daily  . multivitamin with minerals  1 tablet Oral Daily  . nicotine  21 mg Transdermal Daily  . pantoprazole (PROTONIX) IV  40 mg Intravenous Q24H  . sodium chloride flush  10-40 mL Intracatheter Q12H  . thiamine  100 mg Oral Daily   Or  . thiamine  100 mg Intravenous Daily   Continuous Infusions: . sodium chloride 10 mL/hr at 04/07/20 0732  . ceFEPime (MAXIPIME) IV 2 g (04/07/20 0734)  . DAPTOmycin (CUBICIN)  IV 550 mg (04/06/20 2005)     LOS: 7 days    Time spent: 28 mins     Charise Killian, MD [facility] Pager [phone]  If 7PM-7AM, please contact night-coverage 04/07/2020, 11:34 AM

## 2020-04-08 LAB — CBC
HCT: 38.3 % (ref 36.0–46.0)
Hemoglobin: 12.9 g/dL (ref 12.0–15.0)
MCH: 26.9 pg (ref 26.0–34.0)
MCHC: 33.7 g/dL (ref 30.0–36.0)
MCV: 79.8 fL — ABNORMAL LOW (ref 80.0–100.0)
Platelets: 338 10*3/uL (ref 150–400)
RBC: 4.8 MIL/uL (ref 3.87–5.11)
RDW: 14.5 % (ref 11.5–15.5)
WBC: 12.5 10*3/uL — ABNORMAL HIGH (ref 4.0–10.5)
nRBC: 0 % (ref 0.0–0.2)

## 2020-04-08 LAB — BASIC METABOLIC PANEL
Anion gap: 9 (ref 5–15)
BUN: 14 mg/dL (ref 6–20)
CO2: 26 mmol/L (ref 22–32)
Calcium: 9.1 mg/dL (ref 8.9–10.3)
Chloride: 96 mmol/L — ABNORMAL LOW (ref 98–111)
Creatinine, Ser: 0.46 mg/dL (ref 0.44–1.00)
GFR, Estimated: >60 mL/min (ref >60)
Glucose, Bld: 99 mg/dL (ref 70–99)
Potassium: 4.6 mmol/L (ref 3.5–5.1)
Sodium: 131 mmol/L — ABNORMAL LOW (ref 135–145)

## 2020-04-08 MED ORDER — ENOXAPARIN SODIUM 40 MG/0.4ML ~~LOC~~ SOLN
40.0000 mg | SUBCUTANEOUS | Status: DC
  Administered 2020-04-08 – 2020-04-12 (×4): 40 mg via SUBCUTANEOUS
  Filled 2020-04-08 (×7): qty 0.4

## 2020-04-08 NOTE — Plan of Care (Signed)
  Problem: Clinical Measurements: Goal: Ability to maintain clinical measurements within normal limits will improve Outcome: Progressing   Problem: Clinical Measurements: Goal: Will remain free from infection Outcome: Progressing   Problem: Clinical Measurements: Goal: Diagnostic test results will improve Outcome: Progressing   

## 2020-04-08 NOTE — Progress Notes (Signed)
PROGRESS NOTE    Susan Terry  ZOX:[id number] DOB: [birth year] DOA: 03/31/2020 [facility]   Assessment & Plan:   Principal Problem:   Osteomyelitis of vertebra of cervical region [facility](HCC) Active Problems:   Discitis of cervical region   Tobacco use disorder   IVDU (intravenous drug user)   Heroin abuse (HCC)   Pain in neck   Acute discitis & osteomyelitis of C5-6: secondary to IVDU. C/o pain again this morning. Continue on IV cefepime, daptomycin x 6 weeks as per ID. No acute surgery needed as per neurosurg. Echo did not show any vegetations. Pt has no insurance so pt will not be able to go to CIR or SNF for long term abx.  Mild cellulitis right hand: hand cx growing MRSA. Continue on IV abxs   Leukocytosis: labile. Secondary to above infections. Continue on IV abxs   IVDU: last heroin use was just before admission. Illicit drug use cessation counseling  Hypophosphatemia: resolved    Hyponatremia: labile. Will continue to monitor    DVT prophylaxis: SCDs, lovenox Code Status: full Family Communication:  Disposition Plan: possibly back home   Level of care: Med-Surg   Status is: Inpatient  Remains inpatient appropriate because:Unsafe d/c plan, IV treatments appropriate due to intensity of illness or inability to take PO and Inpatient level of care appropriate due to severity of illness needs 6 weeks of IV abxs   Dispo: The patient is from: Home              Anticipated d/c is to: Home              Patient currently is not medically stable to d/c.   Difficult to place patient Yes     Consultants:   neurosurg  ID   Procedures   Antimicrobials: dapto, cefepime    Subjective: Pt c/o neck pain  Objective: Vitals:   04/07/20 1459 04/07/20 2002 04/08/20 0041 04/08/20 0457  BP: (!) 139/99 133/80 132/80 (!) 137/97  Pulse: 93 98 91 91  Resp: 17 15 16 17   Temp: 98.1 F (36.7 C) 98.3 F (36.8 C) 98.1 F (36.7 C) 98.5 F (36.9 C)  TempSrc:    Oral Oral  SpO2: 99% 99% 99% 100%  Weight:      Height:       No intake or output data in the 24 hours ending 04/08/20 0730 Filed Weights   03/31/20 1150 04/02/20 0500  Weight: 63.5 kg 67.8 kg    Examination:  General exam: Appears comfortable  Respiratory system: clear breath sounds b/l  Cardiovascular system: S1/S2+. No rubs or gallops  Gastrointestinal system: Abd is soft, NT, ND & hypoactive bowel sounds  Central nervous system: Awake and oriented. Moves all 4 extremities  Psychiatry: Judgement and insight appear normal. Flat mood and affect     Data Reviewed: I have personally reviewed following labs and imaging studies  CBC: Recent Labs  Lab 04/03/20 0823 04/04/20 0933 04/06/20 0810 04/07/20 0725 04/08/20 0431  WBC 6.0 7.7 10.6* 11.2* 12.5*  HGB 11.8* 12.5 11.9* 13.2 12.9  HCT 36.0 37.9 35.7* 39.6 38.3  MCV 81.6 80.8 79.5* 79.5* 79.8*  PLT 149* 185 277 302 338   Basic Metabolic Panel: Recent Labs  Lab 04/03/20 0823 04/04/20 0018 04/04/20 0933 04/06/20 0810 04/07/20 0725 04/08/20 0431  NA 131*  --  133* 132* 133* 131*  K 3.0*  --  3.8 3.6 4.5 4.6  CL 99  --  101 100 97* 96*  CO2 22  --  23 24 27 26   GLUCOSE 179*  --  131* 115* 111* 99  BUN 10  --  8 7 10 14   CREATININE 0.60 0.69 0.59 0.52 0.81 0.46  CALCIUM 8.0*  --  8.5* 8.4* 9.1 9.1  MG  --   --  1.9  --   --   --   PHOS  --   --  2.0* 3.0  --   --    GFR: Estimated Creatinine Clearance: 89.1 mL/min (by C-G formula based on SCr of 0.46 mg/dL). Liver Function Tests: No results for input(s): AST, ALT, ALKPHOS, BILITOT, PROT, ALBUMIN in the last 168 hours. No results for input(s): LIPASE, AMYLASE in the last 168 hours. No results for input(s): AMMONIA in the last 168 hours. Coagulation Profile: No results for input(s): INR, PROTIME in the last 168 hours. Cardiac Enzymes: Recent Labs  Lab 04/06/20 0810  CKTOTAL 13*   BNP (last 3 results) No results for input(s): PROBNP in the last 8760  hours. HbA1C: No results for input(s): HGBA1C in the last 72 hours. CBG: No results for input(s): GLUCAP in the last 168 hours. Lipid Profile: No results for input(s): CHOL, HDL, LDLCALC, TRIG, CHOLHDL, LDLDIRECT in the last 72 hours. Thyroid Function Tests: No results for input(s): TSH, T4TOTAL, FREET4, T3FREE, THYROIDAB in the last 72 hours. Anemia Panel: No results for input(s): VITAMINB12, FOLATE, FERRITIN, TIBC, IRON, RETICCTPCT in the last 72 hours. Sepsis Labs: No results for input(s): PROCALCITON, LATICACIDVEN in the last 168 hours.  Recent Results (from the past 240 hour(s))  Resp Panel by RT-PCR (Flu A&B, Covid) Nasopharyngeal Swab     Status: None   Collection Time: 03/31/20  8:01 PM   Specimen: Nasopharyngeal Swab; Nasopharyngeal(NP) swabs in vial transport medium  Result Value Ref Range Status   SARS Coronavirus 2 by RT PCR NEGATIVE NEGATIVE Final    Comment: (NOTE) SARS-CoV-2 target nucleic acids are NOT DETECTED.  The SARS-CoV-2 RNA is generally detectable in upper respiratory specimens during the acute phase of infection. The lowest concentration of SARS-CoV-2 viral copies this assay can detect is 138 copies/mL. A negative result does not preclude SARS-Cov-2 infection and should not be used as the sole basis for treatment or other patient management decisions. A negative result may occur with  improper specimen collection/handling, submission of specimen other than nasopharyngeal swab, presence of viral mutation(s) within the areas targeted by this assay, and inadequate number of viral copies(<138 copies/mL). A negative result must be combined with clinical observations, patient history, and epidemiological information. The expected result is Negative.  Fact Sheet for Patients:  06/06/20  Fact Sheet for Healthcare Providers:  05/31/20  This test is no t yet approved or cleared by the [employer] FDA and  has been authorized for detection and/or diagnosis of SARS-CoV-2 by FDA under an Emergency Use Authorization (EUA). This EUA will remain  in effect (meaning this test can be used) for the duration of the COVID-19 declaration under Section 564(b)(1) of the Act, 21 U.S.C.section 360bbb-3(b)(1), unless the authorization is terminated  or revoked sooner.       Influenza A by PCR NEGATIVE NEGATIVE Final   Influenza B by PCR NEGATIVE NEGATIVE Final    Comment: (NOTE) The Xpert Xpress SARS-CoV-2/FLU/RSV plus assay is intended as an aid in the diagnosis of influenza from Nasopharyngeal swab specimens and should not be used as a sole basis for treatment. Nasal washings and aspirates are unacceptable for Xpert Xpress  SARS-CoV-2/FLU/RSV testing.  Fact Sheet for Patients: [employer]Course.com  Fact Sheet for Healthcare Providers: SeriousBroker.it  This test is not yet approved or cleared by the [place] FDA and has been authorized for detection and/or diagnosis of SARS-CoV-2 by FDA under an Emergency Use Authorization (EUA). This EUA will remain in effect (meaning this test can be used) for the duration of the COVID-19 declaration under Section 564(b)(1) of the Act, 21 U.S.C. section 360bbb-3(b)(1), unless the authorization is terminated or revoked.  Performed at [facility], [address] [place], [place] [address]   Culture, blood (Routine X 2) w Reflex to ID Panel     Status: None   Collection Time: 03/31/20 11:50 PM   Specimen: BLOOD  Result Value Ref Range Status   Specimen Description BLOOD BLOOD LEFT WRIST  Final   Special Requests   Final    BOTTLES DRAWN AEROBIC AND ANAEROBIC Blood Culture adequate volume   Culture   Final    NO GROWTH 5 DAYS Performed at [facility], [address] [place], [place] [address]    Report Status 04/05/2020 FINAL  Final  MRSA PCR Screening     Status:  None   Collection Time: 04/02/20 11:40 AM   Specimen: Nasal Mucosa; Nasopharyngeal  Result Value Ref Range Status   MRSA by PCR NEGATIVE NEGATIVE Final    Comment:        The GeneXpert MRSA Assay (FDA approved for NASAL specimens only), is one component of a comprehensive MRSA colonization surveillance program. It is not intended to diagnose MRSA infection nor to guide or monitor treatment for MRSA infections. Performed at [facility], [address], [place], [place] [address]   Aerobic Culture w Gram Stain (superficial specimen)     Status: None   Collection Time: 04/02/20  7:23 PM   Specimen: Hand; Wound  Result Value Ref Range Status   Specimen Description   Final    HAND Performed at [facility], [address], [place], [place] [address]    Special Requests   Final    NONE Performed at [facility], [address] [place], [place] [address]    Gram Stain   Final    MODERATE WBC PRESENT, PREDOMINANTLY PMN RARE GRAM POSITIVE COCCI Performed at [facility], [address], [place], [place] [address]    Culture   Final    MODERATE METHICILLIN RESISTANT STAPHYLOCOCCUS AUREUS   Report Status 04/05/2020 FINAL  Final   Organism ID, Bacteria METHICILLIN RESISTANT STAPHYLOCOCCUS AUREUS  Final      Susceptibility   Methicillin resistant staphylococcus aureus - MIC*    CIPROFLOXACIN >=8 RESISTANT Resistant     ERYTHROMYCIN >=8 RESISTANT Resistant     GENTAMICIN >=16 RESISTANT Resistant     OXACILLIN >=4 RESISTANT Resistant     TETRACYCLINE >=16 RESISTANT Resistant     VANCOMYCIN 1 SENSITIVE Sensitive     TRIMETH/SULFA >=320 RESISTANT Resistant     CLINDAMYCIN >=8 RESISTANT Resistant     RIFAMPIN <=0.5 SENSITIVE Sensitive     Inducible Clindamycin NEGATIVE Sensitive     * MODERATE METHICILLIN RESISTANT STAPHYLOCOCCUS AUREUS  Culture, blood (Routine X 2) w Reflex to ID Panel     Status: None   Collection Time: 04/02/20 11:06 PM    Specimen: BLOOD  Result Value Ref Range Status   Specimen Description BLOOD RIGHT HAND  Final   Special Requests   Final    BOTTLES DRAWN AEROBIC  AND ANAEROBIC Blood Culture adequate volume   Culture   Final    NO GROWTH 5 DAYS Performed at [facility], [address] [place], [place] [address]    Report Status 04/07/2020 FINAL  Final  Culture, blood (Routine X 2) w Reflex to ID Panel     Status: None   Collection Time: 04/02/20 11:06 PM   Specimen: BLOOD  Result Value Ref Range Status   Specimen Description BLOOD LEFT [facility]  Final   Special Requests   Final    BOTTLES DRAWN AEROBIC AND ANAEROBIC Blood Culture adequate volume   Culture   Final    NO GROWTH 5 DAYS Performed at [facility], [address], [place], [place] [address]    Report Status 04/07/2020 FINAL  Final         Radiology Studies: No results found.      Scheduled Meds: . diclofenac  1 patch Transdermal BID  . folic acid  1 mg Oral Daily  . multivitamin with minerals  1 tablet Oral Daily  . nicotine  21 mg Transdermal Daily  . pantoprazole (PROTONIX) IV  40 mg Intravenous Q24H  . sodium chloride flush  10-40 mL Intracatheter Q12H  . thiamine  100 mg Oral Daily   Or  . thiamine  100 mg Intravenous Daily   Continuous Infusions: . sodium chloride 250 mL (04/07/20 1455)  . ceFEPime (MAXIPIME) IV 2 g (04/08/20 0634)  . DAPTOmycin (CUBICIN)  IV 550 mg (04/07/20 2017)     LOS: 8 days    Time spent: 25 mins     Charise Killian, MD [facility] Pager [phone]  If 7PM-7AM, please contact night-coverage 04/08/2020, 7:30 AM

## 2020-04-09 DIAGNOSIS — G062 Extradural and subdural abscess, unspecified: Secondary | ICD-10-CM

## 2020-04-09 DIAGNOSIS — Z720 Tobacco use: Secondary | ICD-10-CM

## 2020-04-09 LAB — DRUG PROFILE, UR, 9 DRUGS (LABCORP)
Amphetamines, Urine: NEGATIVE ng/mL
Barbiturate, Ur: NEGATIVE ng/mL
Benzodiazepine Quant, Ur: NEGATIVE ng/mL
Cannabinoid Quant, Ur: NEGATIVE ng/mL
Cocaine (Metab.): NEGATIVE ng/mL
Methadone Screen, Urine: NEGATIVE ng/mL
Opiate Quant, Ur: NEGATIVE ng/mL
Phencyclidine, Ur: NEGATIVE ng/mL
Propoxyphene, Urine: NEGATIVE ng/mL

## 2020-04-09 NOTE — Progress Notes (Signed)
Pt found out of room. Had walked to gift shop without staff permission. I explained to patient that she could only walk around the unit wearing gown, gloves and mask. Must be with staff member. Verbalized understanding.  Physician Dr. Mayford Knife notified.

## 2020-04-09 NOTE — Progress Notes (Signed)
Date of Admission:  03/31/2020     ID: Susan Terry is a 45 y.o. female Principal Problem:   Osteomyelitis of vertebra of cervical region [facility]) Active Problems:   Discitis of cervical region   Tobacco use disorder   IVDU (intravenous drug user)   Heroin abuse (HCC)   Pain in neck    Subjective: Pt wants to know whether she can walk in the corridor Has neck pain but better than before She also wants to go home earlier than 6 weeks of Iv if possible  Medications:  . diclofenac  1 patch Transdermal BID  . enoxaparin (LOVENOX) injection  40 mg Subcutaneous Q24H  . folic acid  1 mg Oral Daily  . multivitamin with minerals  1 tablet Oral Daily  . nicotine  21 mg Transdermal Daily  . pantoprazole (PROTONIX) IV  40 mg Intravenous Q24H  . sodium chloride flush  10-40 mL Intracatheter Q12H  . thiamine  100 mg Oral Daily   Or  . thiamine  100 mg Intravenous Daily    Objective: Vital signs in last 24 hours: Temp:  [98.3 F (36.8 C)-99.2 F (37.3 C)] 99.2 F (37.3 C) (03/14 0737) Pulse Rate:  [90-105] 90 (03/14 0737) Resp:  [17-19] 17 (03/14 0737) BP: (119-138)/(66-107) 122/70 (03/14 0737) SpO2:  [99 %-100 %] 99 % (03/14 0737)  PHYSICAL EXAM:  General: Alert, cooperative, no distress, Head: Normocephalic, without obvious abnormality, atraumatic. Eyes: Conjunctivae clear, anicteric sclerae. Pupils are equal ENT Nares normal. No drainage or sinus tenderness. Lips, mucosa, and tongue normal. No Thrush Lungs: Clear to auscultation bilaterally. No Wheezing or Rhonchi. No rales. Heart: Regular rate and rhythm, no murmur, rub or gallop. Abdomen: Soft, non-tender,not distended. Bowel sounds normal. No masses Extremities: atraumatic, no cyanosis. No edema. No clubbing Skin: No rashes or lesions. Or bruising Lymph: Cervical, supraclavicular normal. Neurologic: Grossly non-focal  Lab Results Recent Labs    04/07/20 0725 04/08/20 0431  WBC 11.2* 12.5*  HGB 13.2 12.9  HCT 39.6  38.3  NA 133* 131*  K 4.5 4.6  CL 97* 96*  CO2 27 26  BUN 10 14  CREATININE 0.81 0.46   Liver Panel No results for input(s): PROT, ALBUMIN, AST, ALT, ALKPHOS, BILITOT, BILIDIR, IBILI in the last 72 hours. Sedimentation Rate No results for input(s): ESRSEDRATE in the last 72 hours. C-Reactive Protein Recent Labs    04/07/20 0725  CRP 3.2*    Microbiology:  03/31/2020 blood culture no growth 04/02/2020 blood culture no growth 04/02/2020 right arm wound culture is MRSA    Assessment/Plan: 45 year old female with history of IVDA presents with cervical pain and fever  Cervical vertebral osteomyelitis and discitis involving C5-C6 with epidural phlegmon extending up to C1.  Patient is currently on cefepime and daptomycin.  She initially was on vancomycin which was changed to daptomycin at her insistence because she thought vancomycin was caused burning and increasing pain. Daptomycin is not the first line of therapy for MRSA epidural abscess.  Bioavailability the CSF space may be diminished.  Her white count is trending up. We will repeat MRI of the cervical spine.  Decide on switching her back to vancomycin and giving it slowly She needs 6 weeks of IV antibiotics.  But she is requesting whether she could be discharged earlier on p.o. antibiotic.  After 2 weeks of IV antibiotic will look into the option of giving dalbavancin IV once a week for [redacted] weeks along with p.o. antibiotic.  History of lumbar discitis  and osteomyelitis.  Leading to surgery.  History of MRSA infection  History of Serratia infection.  Patient would like to ambulate in the corridor. We will check with infection prevention regarding that  Patient would benefit from a soft cervical collar while in bed and hard collar while moving.  Discussed the management with the patient in detail.

## 2020-04-09 NOTE — Progress Notes (Signed)
PROGRESS NOTE    Susan Terry  CWU:[id number] DOB: 1975-02-23 DOA: 03/31/2020 [facility]   Assessment & Plan:   Principal Problem:   Osteomyelitis of vertebra of cervical region [facility]) Active Problems:   Discitis of cervical region   Tobacco use disorder   IVDU (intravenous drug user)   Heroin abuse (HCC)   Pain in neck   Acute discitis & osteomyelitis of C5-6: secondary to IVDU. Continue on IV daptomycin, cefepime x 6 weeks as per ID. No acute surgery needed as per neurosurg. Echo did not show any vegetations. Pt has no insurance so pt will not able to go to CIR or SNF for long term abx   Mild cellulitis right hand: hand cx growing MRSA. Continue on IV abxs   Leukocytosis: labile. Secondary to above infections   IVDU: last heroin use was just before admission. Illicit drug use cessation counseling  Hypophosphatemia: resolved    Hyponatremia: labile. Will continue to monitor    DVT prophylaxis: SCDs, lovenox Code Status: full Family Communication:  Disposition Plan: possibly back home   Level of care: Med-Surg   Status is: Inpatient  Remains inpatient appropriate because:Unsafe d/c plan, IV treatments appropriate due to intensity of illness or inability to take PO and Inpatient level of care appropriate due to severity of illness needs 6 weeks of IV abxs   Dispo: The patient is from: Home              Anticipated d/c is to: Home              Patient currently is not medically stable to d/c.   Difficult to place patient Yes     Consultants:   neurosurg  ID   Procedures   Antimicrobials: dapto, cefepime    Subjective: Pt c/o fatigue  Objective: Vitals:   04/08/20 2125 04/08/20 2326 04/09/20 0529 04/09/20 0737  BP: 121/77 120/66 (!) 119/107 122/70  Pulse: (!) 101 91 95 90  Resp: 17 17 17 17   Temp: 98.3 F (36.8 C) 98.3 F (36.8 C) 98.5 F (36.9 C) 99.2 F (37.3 C)  TempSrc: Oral Oral Oral   SpO2: 99% 99% 100% 99%  Weight:       Height:       No intake or output data in the 24 hours ending 04/09/20 0856 Filed Weights   03/31/20 1150 04/02/20 0500  Weight: 63.5 kg 67.8 kg    Examination:  General exam: Appears comfortable  Respiratory system: clear breath sounds b/l  Cardiovascular system: S1 & S2+. No rubs or gallops  Gastrointestinal system: Abd is soft, NT,ND & normal bowel sounds  Central nervous system: Awake and oriented. Moves all 4 extremities  Psychiatry: Judgement and insight appear normal. Flat mood and affect     Data Reviewed: I have personally reviewed following labs and imaging studies  CBC: Recent Labs  Lab 04/03/20 0823 04/04/20 0933 04/06/20 0810 04/07/20 0725 04/08/20 0431  WBC 6.0 7.7 10.6* 11.2* 12.5*  HGB 11.8* 12.5 11.9* 13.2 12.9  HCT 36.0 37.9 35.7* 39.6 38.3  MCV 81.6 80.8 79.5* 79.5* 79.8*  PLT 149* 185 277 302 338   Basic Metabolic Panel: Recent Labs  Lab 04/03/20 0823 04/04/20 0018 04/04/20 0933 04/06/20 0810 04/07/20 0725 04/08/20 0431  NA 131*  --  133* 132* 133* 131*  K 3.0*  --  3.8 3.6 4.5 4.6  CL 99  --  101 100 97* 96*  CO2 22  --  23 24 27  26  GLUCOSE 179*  --  131* 115* 111* 99  BUN 10  --  8 7 10 14   CREATININE 0.60 0.69 0.59 0.52 0.81 0.46  CALCIUM 8.0*  --  8.5* 8.4* 9.1 9.1  MG  --   --  1.9  --   --   --   PHOS  --   --  2.0* 3.0  --   --    GFR: Estimated Creatinine Clearance: 89.1 mL/min (by C-G formula based on SCr of 0.46 mg/dL). Liver Function Tests: No results for input(s): AST, ALT, ALKPHOS, BILITOT, PROT, ALBUMIN in the last 168 hours. No results for input(s): LIPASE, AMYLASE in the last 168 hours. No results for input(s): AMMONIA in the last 168 hours. Coagulation Profile: No results for input(s): INR, PROTIME in the last 168 hours. Cardiac Enzymes: Recent Labs  Lab 04/06/20 0810  CKTOTAL 13*   BNP (last 3 results) No results for input(s): PROBNP in the last 8760 hours. HbA1C: No results for input(s): HGBA1C in the  last 72 hours. CBG: No results for input(s): GLUCAP in the last 168 hours. Lipid Profile: No results for input(s): CHOL, HDL, LDLCALC, TRIG, CHOLHDL, LDLDIRECT in the last 72 hours. Thyroid Function Tests: No results for input(s): TSH, T4TOTAL, FREET4, T3FREE, THYROIDAB in the last 72 hours. Anemia Panel: No results for input(s): VITAMINB12, FOLATE, FERRITIN, TIBC, IRON, RETICCTPCT in the last 72 hours. Sepsis Labs: No results for input(s): PROCALCITON, LATICACIDVEN in the last 168 hours.  Recent Results (from the past 240 hour(s))  Resp Panel by RT-PCR (Flu A&B, Covid) Nasopharyngeal Swab     Status: None   Collection Time: 03/31/20  8:01 PM   Specimen: Nasopharyngeal Swab; Nasopharyngeal(NP) swabs in vial transport medium  Result Value Ref Range Status   SARS Coronavirus 2 by RT PCR NEGATIVE NEGATIVE Final    Comment: (NOTE) SARS-CoV-2 target nucleic acids are NOT DETECTED.  The SARS-CoV-2 RNA is generally detectable in upper respiratory specimens during the acute phase of infection. The lowest concentration of SARS-CoV-2 viral copies this assay can detect is 138 copies/mL. A negative result does not preclude SARS-Cov-2 infection and should not be used as the sole basis for treatment or other patient management decisions. A negative result may occur with  improper specimen collection/handling, submission of specimen other than nasopharyngeal swab, presence of viral mutation(s) within the areas targeted by this assay, and inadequate number of viral copies(<138 copies/mL). A negative result must be combined with clinical observations, patient history, and epidemiological information. The expected result is Negative.  Fact Sheet for Patients:  05/31/20  Fact Sheet for Healthcare Providers:  BloggerCourse.com  This test is no t yet approved or cleared by the [place] FDA and  has been authorized for detection and/or  diagnosis of SARS-CoV-2 by FDA under an Emergency Use Authorization (EUA). This EUA will remain  in effect (meaning this test can be used) for the duration of the COVID-19 declaration under Section 564(b)(1) of the Act, 21 U.S.C.section 360bbb-3(b)(1), unless the authorization is terminated  or revoked sooner.       Influenza A by PCR NEGATIVE NEGATIVE Final   Influenza B by PCR NEGATIVE NEGATIVE Final    Comment: (NOTE) The Xpert Xpress SARS-CoV-2/FLU/RSV plus assay is intended as an aid in the diagnosis of influenza from Nasopharyngeal swab specimens and should not be used as a sole basis for treatment. Nasal washings and aspirates are unacceptable for Xpert Xpress SARS-CoV-2/FLU/RSV testing.  Fact Sheet for Patients: [place]  Fact Sheet for Healthcare Providers: SeriousBroker.it  This test is not yet approved or cleared by the [place] FDA and has been authorized for detection and/or diagnosis of SARS-CoV-2 by FDA under an Emergency Use Authorization (EUA). This EUA will remain in effect (meaning this test can be used) for the duration of the COVID-19 declaration under Section 564(b)(1) of the Act, 21 U.S.C. section 360bbb-3(b)(1), unless the authorization is terminated or revoked.  Performed at [facility], [address] [place], [place] [address]   Culture, blood (Routine X 2) w Reflex to ID Panel     Status: None   Collection Time: 03/31/20 11:50 PM   Specimen: BLOOD  Result Value Ref Range Status   Specimen Description BLOOD BLOOD LEFT WRIST  Final   Special Requests   Final    BOTTLES DRAWN AEROBIC AND ANAEROBIC Blood Culture adequate volume   Culture   Final    NO GROWTH 5 DAYS Performed at [facility], [address] [place], [place] [address]    Report Status 04/05/2020 FINAL  Final  MRSA PCR Screening     Status: None   Collection Time: 04/02/20 11:40 AM   Specimen:  Nasal Mucosa; Nasopharyngeal  Result Value Ref Range Status   MRSA by PCR NEGATIVE NEGATIVE Final    Comment:        The GeneXpert MRSA Assay (FDA approved for NASAL specimens only), is one component of a comprehensive MRSA colonization surveillance program. It is not intended to diagnose MRSA infection nor to guide or monitor treatment for MRSA infections. Performed at [facility], [address] [place], [place] [address]   Aerobic Culture w Gram Stain (superficial specimen)     Status: None   Collection Time: 04/02/20  7:23 PM   Specimen: Hand; Wound  Result Value Ref Range Status   Specimen Description   Final    HAND Performed at [facility], [address], [place], [place] [address]    Special Requests   Final    NONE Performed at [facility], [address] [place], [place] [address]    Gram Stain   Final    MODERATE WBC PRESENT, PREDOMINANTLY PMN RARE GRAM POSITIVE COCCI Performed at [facility], [address], [place], [place] [address]    Culture   Final    MODERATE METHICILLIN RESISTANT STAPHYLOCOCCUS AUREUS   Report Status 04/05/2020 FINAL  Final   Organism ID, Bacteria METHICILLIN RESISTANT STAPHYLOCOCCUS AUREUS  Final      Susceptibility   Methicillin resistant staphylococcus aureus - MIC*    CIPROFLOXACIN >=8 RESISTANT Resistant     ERYTHROMYCIN >=8 RESISTANT Resistant     GENTAMICIN >=16 RESISTANT Resistant     OXACILLIN >=4 RESISTANT Resistant     TETRACYCLINE >=16 RESISTANT Resistant     VANCOMYCIN 1 SENSITIVE Sensitive     TRIMETH/SULFA >=320 RESISTANT Resistant     CLINDAMYCIN >=8 RESISTANT Resistant     RIFAMPIN <=0.5 SENSITIVE Sensitive     Inducible Clindamycin NEGATIVE Sensitive     * MODERATE METHICILLIN RESISTANT STAPHYLOCOCCUS AUREUS  Culture, blood (Routine X 2) w Reflex to ID Panel     Status: None   Collection Time: 04/02/20 11:06 PM   Specimen: BLOOD  Result Value Ref Range Status    Specimen Description BLOOD RIGHT HAND  Final   Special Requests   Final    BOTTLES DRAWN AEROBIC AND ANAEROBIC Blood Culture adequate volume   Culture  Final    NO GROWTH 5 DAYS Performed at [facility], [address] [place], [place] [address]    Report Status 04/07/2020 FINAL  Final  Culture, blood (Routine X 2) w Reflex to ID Panel     Status: None   Collection Time: 04/02/20 11:06 PM   Specimen: BLOOD  Result Value Ref Range Status   Specimen Description [facility]LOOD LEFT [facility]  Final   Special Requests   Final    BOTTLES DRAWN AEROBIC AND ANAEROBIC Blood Culture adequate volume   Culture   Final    NO GROWTH 5 DAYS Performed at [facility], [address] [place], [place] [address]    Report Status 04/07/2020 FINAL  Final         Radiology Studies: No results found.      Scheduled Meds: . diclofenac  1 patch Transdermal BID  . enoxaparin (LOVENOX) injection  40 mg Subcutaneous Q24H  . folic acid  1 mg Oral Daily  . multivitamin with minerals  1 tablet Oral Daily  . nicotine  21 mg Transdermal Daily  . pantoprazole (PROTONIX) IV  40 mg Intravenous Q24H  . sodium chloride flush  10-40 mL Intracatheter Q12H  . thiamine  100 mg Oral Daily   Or  . thiamine  100 mg Intravenous Daily   Continuous Infusions: . sodium chloride 250 mL (04/08/20 1514)  . ceFEPime (MAXIPIME) IV 2 g (04/09/20 0604)  . DAPTOmycin (CUBICIN)  IV 550 mg (04/08/20 2046)     LOS: 9 days    Time spent: 23 mins     Charise KillianJamiese M Sakari Raisanen, MD [facility] Pager [phone]  If 7PM-7AM, please contact night-coverage 04/09/2020, 8:56 AM

## 2020-04-10 ENCOUNTER — Inpatient Hospital Stay: Payer: Self-pay

## 2020-04-10 LAB — BASIC METABOLIC PANEL
Anion gap: 7 (ref 5–15)
BUN: 13 mg/dL (ref 6–20)
CO2: 26 mmol/L (ref 22–32)
Calcium: 8.7 mg/dL — ABNORMAL LOW (ref 8.9–10.3)
Chloride: 102 mmol/L (ref 98–111)
Creatinine, Ser: 0.63 mg/dL (ref 0.44–1.00)
GFR, Estimated: >60 mL/min (ref >60)
Glucose, Bld: 83 mg/dL (ref 70–99)
Potassium: 4.4 mmol/L (ref 3.5–5.1)
Sodium: 135 mmol/L (ref 135–145)

## 2020-04-10 LAB — CBC
HCT: 36.3 % (ref 36.0–46.0)
Hemoglobin: 11.8 g/dL — ABNORMAL LOW (ref 12.0–15.0)
MCH: 26.8 pg (ref 26.0–34.0)
MCHC: 32.5 g/dL (ref 30.0–36.0)
MCV: 82.3 fL (ref 80.0–100.0)
Platelets: 324 10*3/uL (ref 150–400)
RBC: 4.41 MIL/uL (ref 3.87–5.11)
RDW: 14.2 % (ref 11.5–15.5)
WBC: 8.3 10*3/uL (ref 4.0–10.5)
nRBC: 0 % (ref 0.0–0.2)

## 2020-04-10 IMAGING — MR MR CERVICAL SPINE WO/W CM
5 of 8 series · 29 of 48 positions shown · IV contrast (6ml Gadavist)
Comparison: [DATE], [DATE]

CLINICAL DATA: Discitis/osteomyelitis, follow-up

EXAM:
MRI CERVICAL SPINE WITHOUT AND WITH CONTRAST
TECHNIQUE: Multiplanar and multiecho pulse sequences of the cervical spine, to
include the craniocervical junction and cervicothoracic junction,
were obtained without and with intravenous contrast.
CONTRAST:  6mL GADAVIST GADOBUTROL 1 MMOL/ML IV SOLN

[Series 5: T2 · sagittal · 3.0mm · 0.62mm/px · 4 of 15 slices shown (1 of 2)]
[im 1/15]
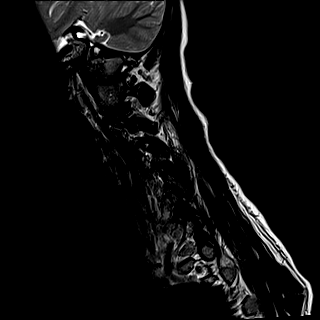
[im 5/15]
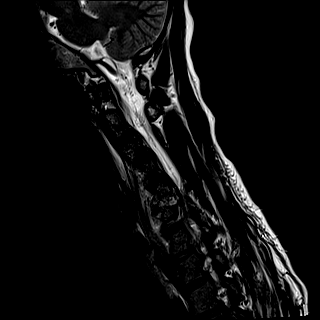
[im 10/15]
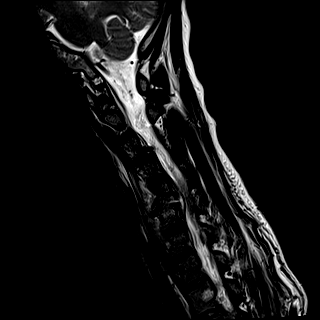
[im 15/15]
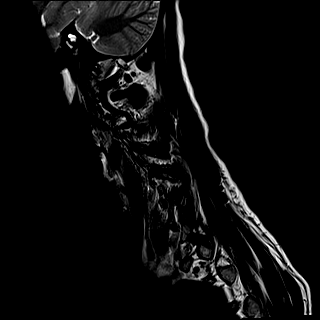

[Series 7: STIR · sagittal · 3.0mm · 0.62mm/px · 4 of 15 slices shown]
[im 1/15]
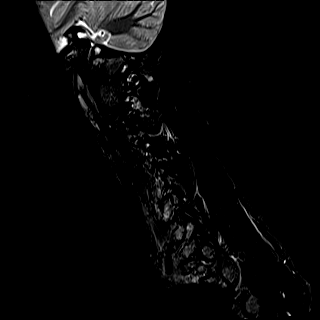
[im 5/15]
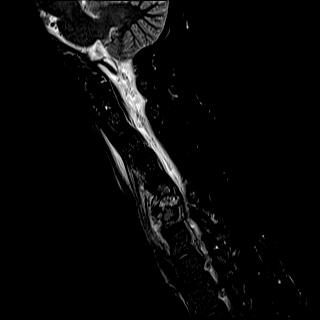
[im 10/15]
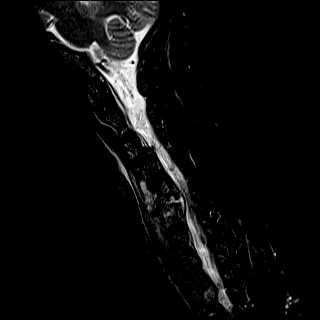
[im 15/15]
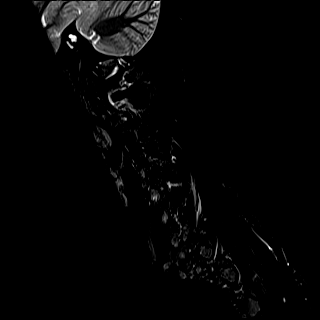

[Series 8: T2 · axial · 3.0mm · 0.70mm/px · z∈[-93,-4]mm · 8 of 29 slices shown (2 of 2)]
[im 1/29]
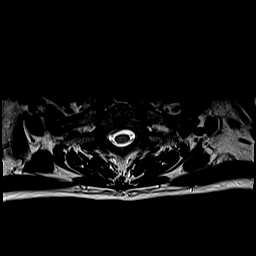
[im 5/29]
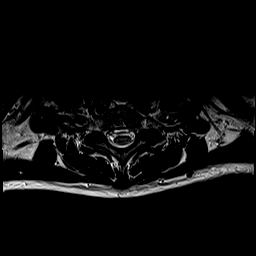
[im 9/29]
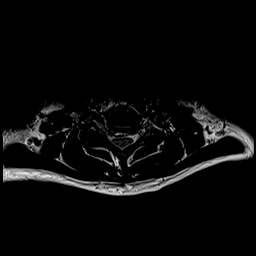
[im 13/29]
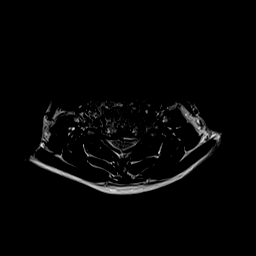
[im 17/29]
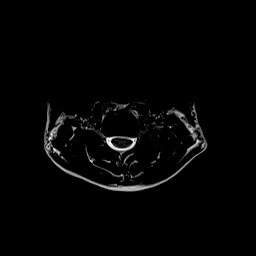
[im 21/29]
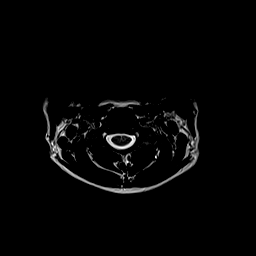
[im 25/29]
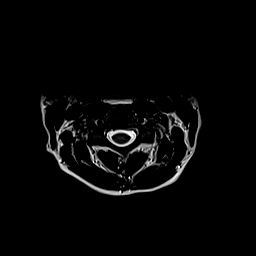
[im 29/29]
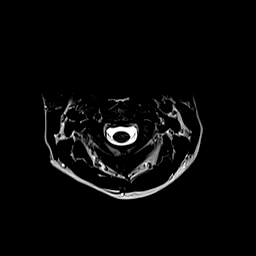

[Series 10: T1 · axial · non-contrast · 3.0mm · 0.35mm/px · z∈[-93,-4]mm · 8 of 29 slices shown]
[im 1/29]
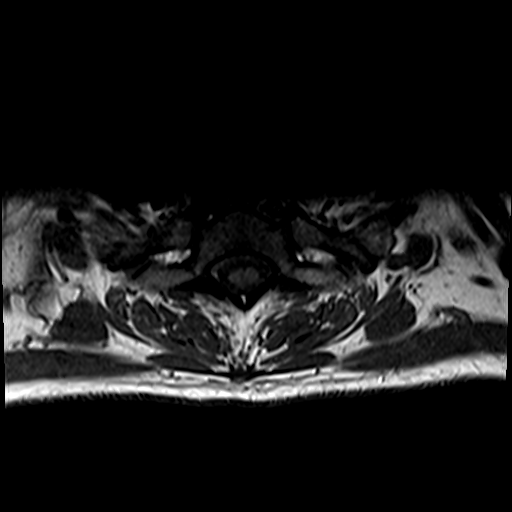
[im 5/29]
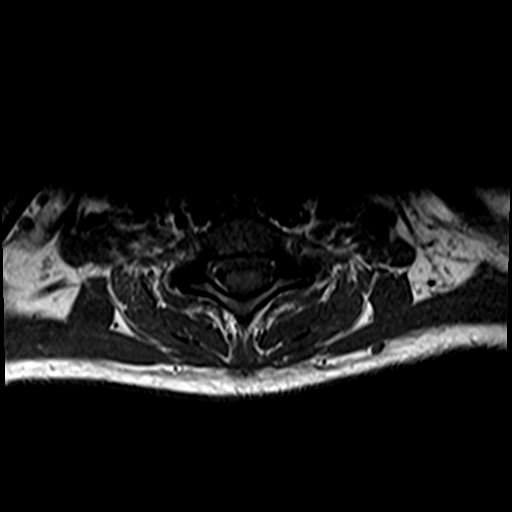
[im 9/29]
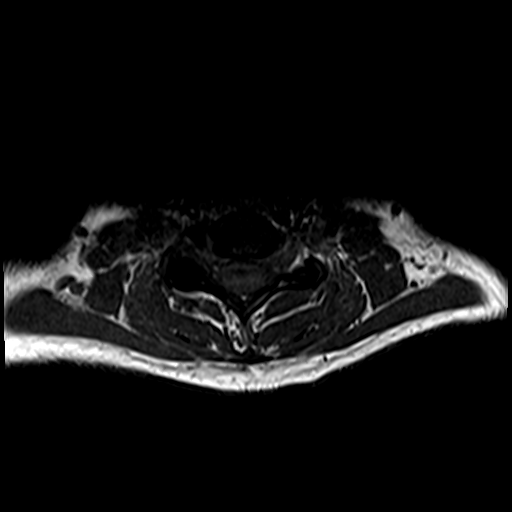
[im 13/29]
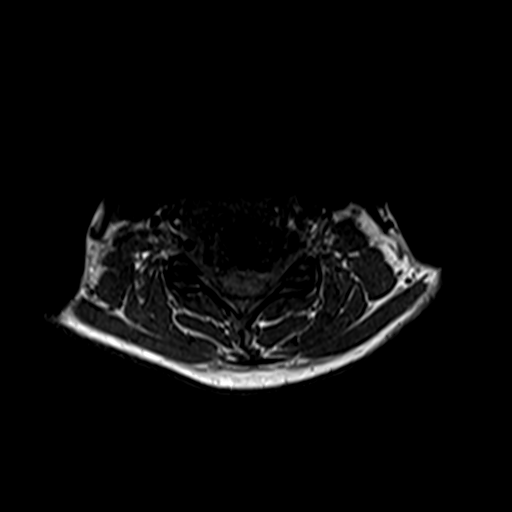
[im 17/29]
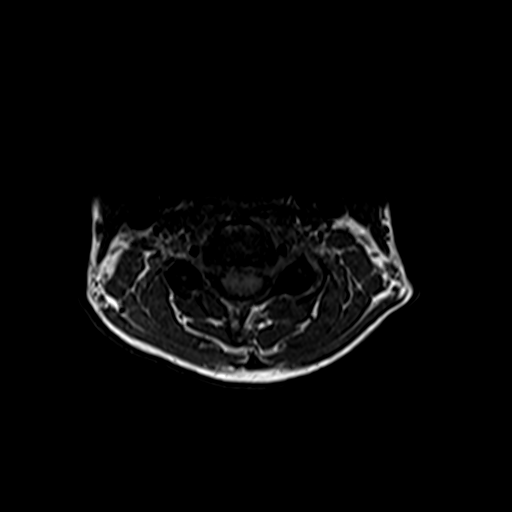
[im 21/29]
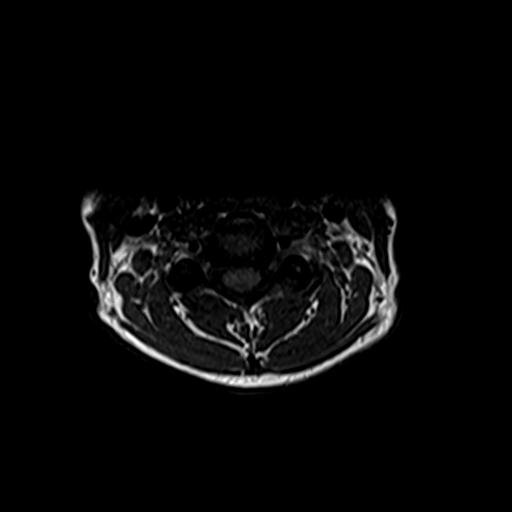
[im 25/29]
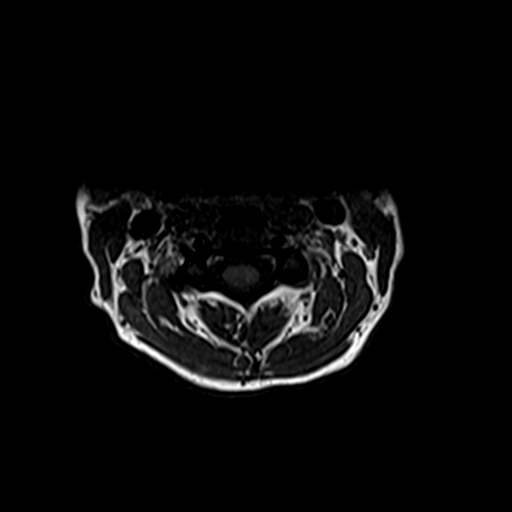
[im 29/29]
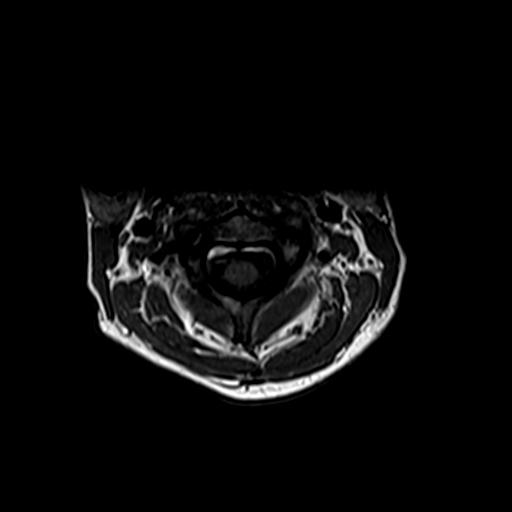

[Series 12: T1 post-contrast · axial · 3.0mm · 0.35mm/px · z∈[-93,-42]mm · 5 of 29 slices shown]
[im 1/29]
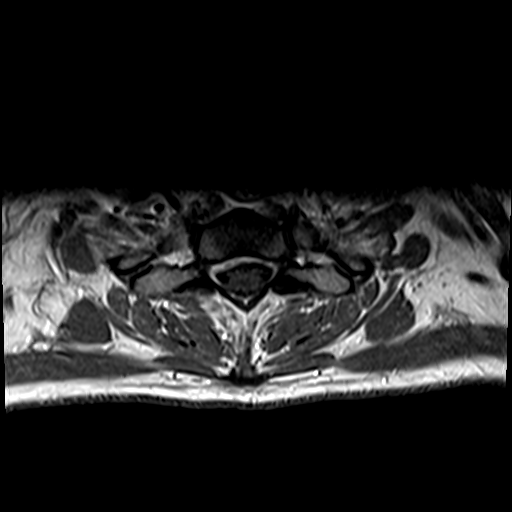
[im 5/29]
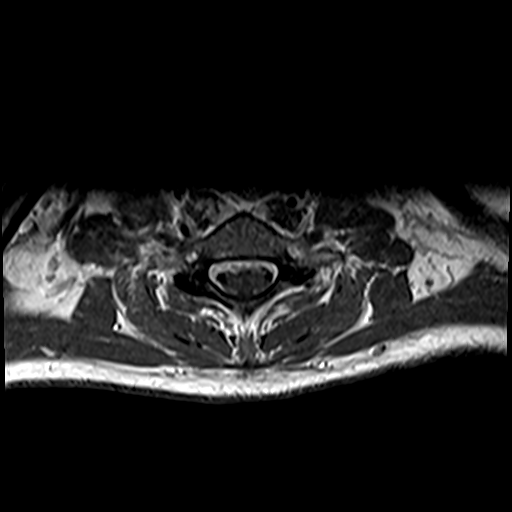
[im 9/29]
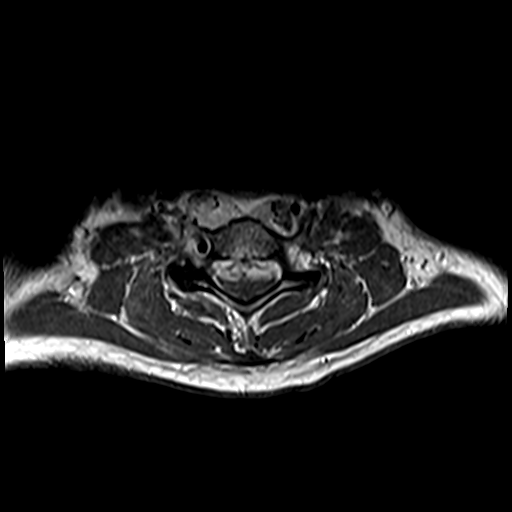
[im 13/29]
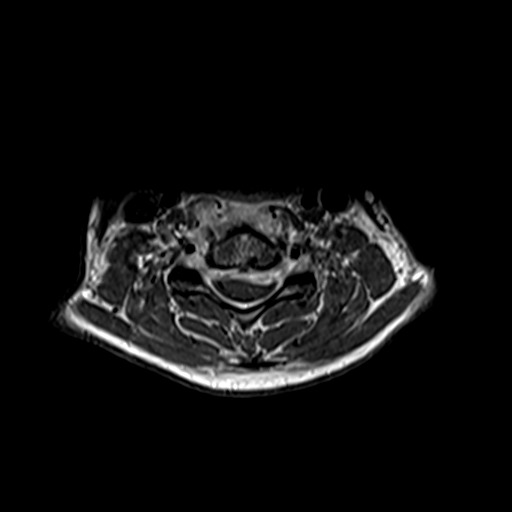
[im 17/29]
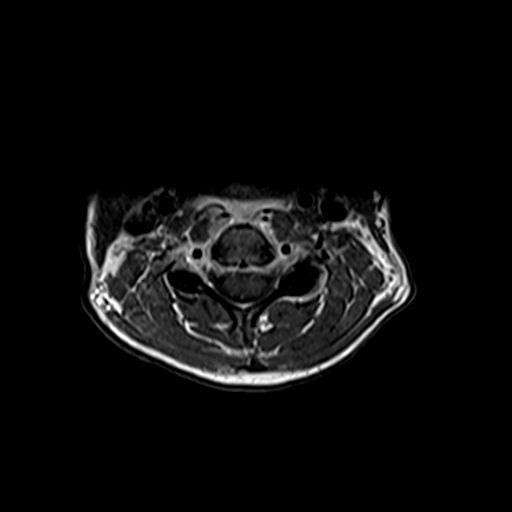

[29 of 48 positions shown; findings below may reference images not displayed]

FINDINGS: Alignment: Stable with retrolisthesis at C5-C6.

Vertebrae: Increased abnormal signal and enhancement of the C5 and
C6 vertebral bodies. Persistent endplate irregularity/erosion.
Ventral epidural STIR hyperintensity and enhancement spanning
primarily C4-C5 to C7-T1. There is also slightly improved dorsal
epidural enhancement from C3 to C5. Normal marrow signal is
otherwise preserved.

Cord: No abnormal signal.

Posterior Fossa, vertebral arteries, paraspinal tissues: Increased
prevertebral fluid but similar enhancement spanning C2 to upper
thoracic levels but greatest at the mid cervical spine. Otherwise
unremarkable.

Disc levels: Multilevel degenerative changes are stable in the short
interval. These changes and superimposed above findings at C5-C6
result in similar marked canal stenosis. Increased ventral epidural
phlegmon at C6 and C7 levels causes moderate to marked stenosis at
C6 and mild stenosis at C7. There is likely foraminal involvement at
C5-C6 and C6-C7.
IMPRESSION: Evolving changes of discitis/osteomyelitis at C5-C6. Persistent
prevertebral phlegmon spanning C2 to upper thoracic levels.
Increased ventral epidural phlegmon spanning C4-C5 to C7-T1.
Slightly decreased dorsal epidural phlegmon at C3-C5. No abscess.

Persistent marked canal stenosis at C5-C6. Increased moderate to
marked stenosis dorsal to C6. Probable foraminal involvement at
C5-C6 and C6-C7.

## 2020-04-10 MED ORDER — GADOBUTROL 1 MMOL/ML IV SOLN
6.0000 mL | Freq: Once | INTRAVENOUS | Status: AC | PRN
  Administered 2020-04-10: 6 mL via INTRAVENOUS

## 2020-04-10 NOTE — Plan of Care (Signed)
°  Problem: Education: °Goal: Knowledge of General Education information will improve °Description: Including pain rating scale, medication(s)/side effects and non-pharmacologic comfort measures °Outcome: Progressing °  °Problem: Clinical Measurements: °Goal: Cardiovascular complication will be avoided °Outcome: Progressing °  °Problem: Activity: °Goal: Risk for activity intolerance will decrease °Outcome: Progressing °  °

## 2020-04-10 NOTE — Progress Notes (Signed)
Date of Admission:  03/31/2020    ID: Susan Terry is a 45 y.o. female  Principal Problem:   Osteomyelitis of vertebra of cervical region [facility]) Active Problems:   Discitis of cervical region   Tobacco use disorder   IVDU (intravenous drug user)   Heroin abuse (HCC)   Pain in neck    Subjective: Pain better than last week  Neck pain, shoulder pain and arm pain  Medications:  . diclofenac  1 patch Transdermal BID  . enoxaparin (LOVENOX) injection  40 mg Subcutaneous Q24H  . folic acid  1 mg Oral Daily  . multivitamin with minerals  1 tablet Oral Daily  . nicotine  21 mg Transdermal Daily  . pantoprazole (PROTONIX) IV  40 mg Intravenous Q24H  . sodium chloride flush  10-40 mL Intracatheter Q12H  . thiamine  100 mg Oral Daily   Or  . thiamine  100 mg Intravenous Daily    Objective: Vital signs in last 24 hours: Temp:  [98.1 F (36.7 C)-98.8 F (37.1 C)] 98.8 F (37.1 C) (03/15 1114) Pulse Rate:  [87-109] 88 (03/15 1114) Resp:  [15-18] 18 (03/15 1114) BP: (125-146)/(71-86) 131/72 (03/15 1114) SpO2:  [99 %-100 %] 100 % (03/15 1114)  PHYSICAL EXAM:  General: Alert, cooperative, no distress, appears stated age.  Head: Normocephalic, without obvious abnormality, atraumatic. Eyes: Conjunctivae clear, anicteric sclerae. Pupils are equal ENT Nares normal. No drainage or sinus tenderness. Lips, mucosa, and tongue normal. No Thrush Neck: Supple, symmetrical, no adenopathy, thyroid: non tender no carotid bruit and no JVD. Back: No CVA tenderness. Lungs: Clear to auscultation bilaterally. No Wheezing or Rhonchi. No rales. Heart: Regular rate and rhythm, no murmur, rub or gallop. Abdomen: Soft, non-tender,not distended. Bowel sounds normal. No masses Extremities: atraumatic, no cyanosis. No edema. No clubbing Skin: No rashes or lesions. Or bruising Lymph: Cervical, supraclavicular normal. Neurologic: Grossly non-focal  Lab Results Recent Labs    04/08/20 0431  04/10/20 0818  WBC 12.5* 8.3  HGB 12.9 11.8*  HCT 38.3 36.3  NA 131* 135  K 4.6 4.4  CL 96* 102  CO2 26 26  BUN 14 13  CREATININE 0.46 0.63    Microbiology:  03/31/2020 blood culture no growth 04/02/2020 blood culture no growth 04/02/2020 right arm wound culture is MRSA  Studies/Results: MR CERVICAL SPINE W WO CONTRAST  Result Date: 04/10/2020 CLINICAL DATA:  Discitis/osteomyelitis, follow-up EXAM: MRI CERVICAL SPINE WITHOUT AND WITH CONTRAST TECHNIQUE: Multiplanar and multiecho pulse sequences of the cervical spine, to include the craniocervical junction and cervicothoracic junction, were obtained without and with intravenous contrast. CONTRAST:  67mL GADAVIST GADOBUTROL 1 MMOL/ML IV SOLN COMPARISON:  03/31/2020, 04/01/2020 FINDINGS: Alignment: Stable with retrolisthesis at C5-C6. Vertebrae: Increased abnormal signal and enhancement of the C5 and C6 vertebral bodies. Persistent endplate irregularity/erosion. Ventral epidural STIR hyperintensity and enhancement spanning primarily C4-C5 to C7-T1. There is also slightly improved dorsal epidural enhancement from C3 to C5. Normal marrow signal is otherwise preserved. Cord: No abnormal signal. Posterior Fossa, vertebral arteries, paraspinal tissues: Increased prevertebral fluid but similar enhancement spanning C2 to upper thoracic levels but greatest at the mid cervical spine. Otherwise unremarkable. Disc levels: Multilevel degenerative changes are stable in the short interval. These changes and superimposed above findings at C5-C6 result in similar marked canal stenosis. Increased ventral epidural phlegmon at C6 and C7 levels causes moderate to marked stenosis at C6 and mild stenosis at C7. There is likely foraminal involvement at C5-C6 and C6-C7. IMPRESSION: Evolving changes  of discitis/osteomyelitis at C5-C6. Persistent prevertebral phlegmon spanning C2 to upper thoracic levels. Increased ventral epidural phlegmon spanning C4-C5 to C7-T1. Slightly  decreased dorsal epidural phlegmon at C3-C5. No abscess. Persistent marked canal stenosis at C5-C6. Increased moderate to marked stenosis dorsal to C6. Probable foraminal involvement at C5-C6 and C6-C7. Electronically Signed   By: Guadlupe Spanish M.D.   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  Patient is currently on cefepime and daptomycin.  She initially was on vancomycin which was changed to daptomycin at her insistence because she thought vancomycin was caused burning and increasing pain. Daptomycin is not the first line of therapy for MRSA epidural abscess.  Bioavailability the CSF space may be diminished.  as WBC normalized and repeat MRI stable will continue the same antibiotic.  She needs 6 weeks of IV antibiotics.  But she is requesting whether she could be discharged earlier on p.o. antibiotic.  After 3  weeks of IV antibiotic will look into the option of giving dalbavancin IV once a week for [redacted] weeks along with p.o. antibiotic.  History of lumbar discitis and osteomyelitis.  Leading to surgery.  History of MRSA infection  History of Serratia infection.  Patient would like to ambulate in the corridor. We will check with infection prevention regarding that  Patient would benefit from a soft cervical collar while in bed and hard collar while moving.  Discussed the management with the patient in detail.
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
## 2020-04-11 DIAGNOSIS — D72829 Elevated white blood cell count, unspecified: Secondary | ICD-10-CM

## 2020-04-11 DIAGNOSIS — F1120 Opioid dependence, uncomplicated: Secondary | ICD-10-CM

## 2020-04-11 DIAGNOSIS — L03119 Cellulitis of unspecified part of limb: Secondary | ICD-10-CM

## 2020-04-11 DIAGNOSIS — L03113 Cellulitis of right upper limb: Secondary | ICD-10-CM

## 2020-04-11 LAB — BASIC METABOLIC PANEL
Anion gap: 9 (ref 5–15)
BUN: 12 mg/dL (ref 6–20)
CO2: 24 mmol/L (ref 22–32)
Calcium: 8.9 mg/dL (ref 8.9–10.3)
Chloride: 103 mmol/L (ref 98–111)
Creatinine, Ser: 0.57 mg/dL (ref 0.44–1.00)
GFR, Estimated: >60 mL/min (ref >60)
Glucose, Bld: 115 mg/dL — ABNORMAL HIGH (ref 70–99)
Potassium: 4.5 mmol/L (ref 3.5–5.1)
Sodium: 136 mmol/L (ref 135–145)

## 2020-04-11 LAB — CBC
HCT: 34.7 % — ABNORMAL LOW (ref 36.0–46.0)
Hemoglobin: 11.1 g/dL — ABNORMAL LOW (ref 12.0–15.0)
MCH: 26.6 pg (ref 26.0–34.0)
MCHC: 32 g/dL (ref 30.0–36.0)
MCV: 83.2 fL (ref 80.0–100.0)
Platelets: UNDETERMINED 10*3/uL (ref 150–400)
RBC: 4.17 MIL/uL (ref 3.87–5.11)
RDW: 14.2 % (ref 11.5–15.5)
WBC: 8.7 10*3/uL (ref 4.0–10.5)
nRBC: 0 % (ref 0.0–0.2)

## 2020-04-11 MED ORDER — PANTOPRAZOLE SODIUM 40 MG PO TBEC
40.0000 mg | DELAYED_RELEASE_TABLET | Freq: Every day | ORAL | Status: DC
  Administered 2020-04-11 – 2020-04-16 (×6): 40 mg via ORAL
  Filled 2020-04-11 (×6): qty 1

## 2020-04-11 NOTE — Progress Notes (Signed)
Referring Physician:  No referring provider defined for this encounter.  Primary Physician:  [facility]  Chief Complaint:  Neck pain  History of Present Illness: 04/11/2020 Susan Terry reports that her neck pain is improving. She denies weakness.  She has some numbness, but it is improving.   04/04/2020 She has no new symptoms.  04/01/2020 Susan Terry is a 45 y.o. female who presents with the chief complaint of severe neck pain over the past few days.  All movements make her pain worse.  She denies any new neurologic symptoms, weakness, numbness, imbalance, or any other new issues with function of her arms or legs.  She is using IV heroin, last within the last week.  She had a draining wound on her R arm recently, and was concerned that she had an infection.  Review of Systems:  A 10 point review of systems is negative, except for the pertinent positives and negatives detailed in the HPI.  Past Medical History:     Past Medical History:  Diagnosis Date  . Drug abuse (HCC)   . Osteomyelitis [facility])     Past Surgical History:      Past Surgical History:  Procedure Laterality Date  . BACK SURGERY    . INCISION AND DRAINAGE FOREARM / WRIST DEEP Right     Allergies:    Allergies as of 03/31/2020  . (No Known Allergies)    Medications:  Current Facility-Administered Medications:  .  0.9 %  sodium chloride infusion, , Intravenous, Continuous, Gertha Calkin, MD, Last Rate: 100 mL/hr at 04/01/20 0500, New Bag at 04/01/20 0500 .  aspirin EC tablet 81 mg, 81 mg, Oral, Daily, Irena Cords V, MD, 81 mg at 04/01/20 1009 .  ceFEPIme (MAXIPIME) 2 g in sodium chloride 0.9 % 100 mL IVPB, 2 g, Intravenous, Q8H, Lowella Bandy, RPH, Last Rate: 200 mL/hr at 04/01/20 1018, 2 g at 04/01/20 1018 .  folic acid (FOLVITE) tablet 1 mg, 1 mg, Oral, Daily, Standley Brooking, MD, 1 mg at 04/01/20 1009 .  HYDROmorphone (DILAUDID) injection 1 mg, 1 mg, Intravenous, Q2H PRN,  Standley Brooking, MD, 1 mg at 04/01/20 1008 .  metroNIDAZOLE (FLAGYL) IVPB 500 mg, 500 mg, Intravenous, Q8H, Irena Cords V, MD, Last Rate: 100 mL/hr at 04/01/20 0513, 500 mg at 04/01/20 0513 .  multivitamin with minerals tablet 1 tablet, 1 tablet, Oral, Daily, Standley Brooking, MD, 1 tablet at 04/01/20 1009 .  nicotine (NICODERM CQ - dosed in mg/24 hours) patch 21 mg, 21 mg, Transdermal, Daily, Patel, Ekta V, MD .  ondansetron (ZOFRAN) tablet 4 mg, 4 mg, Oral, Q6H PRN **OR** ondansetron (ZOFRAN) injection 4 mg, 4 mg, Intravenous, Q6H PRN, Gertha Calkin, MD, 4 mg at 04/01/20 1014 .  pantoprazole (PROTONIX) injection 40 mg, 40 mg, Intravenous, Q24H, Irena Cords V, MD .  thiamine tablet 100 mg, 100 mg, Oral, Daily, 100 mg at 04/01/20 1009 **OR** thiamine (B-1) injection 100 mg, 100 mg, Intravenous, Daily, Standley Brooking, MD .  vancomycin (VANCOREADY) IVPB 1000 mg/200 mL, 1,000 mg, Intravenous, Q12H, Lowella Bandy, RPH   Social History: Social History        Tobacco Use  . Smoking status: Current Every Day Smoker    Packs/day: 0.50    Types: Cigarettes  . Smokeless tobacco: Never Used  Vaping Use  . Vaping Use: Former  Substance Use Topics  . Alcohol use: No  . Drug use: Not Currently    Types:  Methamphetamines, IV    Family Medical History:      Family History  Problem Relation Age of Onset  . Cancer Mother        cancer throughout family    Physical Examination: See vitals flowsheet  General:          Patient is well developed, well nourished, calm, collected, and in pain. She is not guarding her neck.  Psychiatric:      Patient is non-anxious.  Head:               Pupils equal, round, and reactive to light.  ENT:                Oral mucosa appears well hydrated.  Neck:               She will not move her neck.   Respiratory:     Patient is breathing without any difficulty.  Extremities:     Edema has resolved.  Vascular:          Palpable pulses in dorsal pedal vessels.  Skin:                On exposed skin, there are many abnormal skin lesions consistent with IVDU.  NEUROLOGICAL:  General: In no acute distress.   Awake, alert, oriented to person, place, and time.  Pupils equal round and reactive to light.  Facial tone is symmetric.  Tongue protrusion is midline.   ROM of spine: not guarding  Strength: Side Biceps Triceps Deltoid Interossei Grip Wrist Ext. Wrist Flex.  R 5 5 5 5 5 5 5   L 5 5 5 5 5 5 5    Side Iliopsoas Quads Hamstring PF DF EHL  R 5 5 5 5 5 5   L 5 5 5 5 5 5     Bilateral upper and lower extremity sensation is intact to light touch. Reflexes are 1+ and symmetric at the biceps, triceps, brachioradialis, patella and achilles. Hoffman's is absent.  Clonus is not present.  Toes are down-going.      Imaging: MRI C spine 03/31/2020 IMPRESSION: Abnormal appearance of the C5-6 level is most consistent with the patient's prior history of discitis and osteomyelitis. There is mild edema in the endplates and some prevertebral fluid worrisome for active infection. Postcontrast imaging of the cervical spine is recommended for further evaluation.  Mild kyphosis centered about the C5-6 level and a broad-based disc bulge result in flattening of the cord.   Electronically Signed By: M.D. On: 03/31/2020 16:48  MRI C spine 04/10/2020 IMPRESSION: Evolving changes of discitis/osteomyelitis at C5-C6. Persistent prevertebral phlegmon spanning C2 to upper thoracic levels. Increased ventral epidural phlegmon spanning C4-C5 to C7-T1. Slightly decreased dorsal epidural phlegmon at C3-C5. No abscess.  Persistent marked canal stenosis at C5-C6. Increased moderate to marked stenosis dorsal to C6. Probable foraminal involvement at C5-C6 and C6-C7.   Electronically Signed   By: 05/31/2020 M.D.   On: 04/10/2020 08:37  I have personally reviewed the images and agree  with the above interpretation.  Labs: CBC Latest Ref Rng & Units 03/31/2020 11/22/2018 04/05/2017  WBC 4.0 - 10.5 K/uL 6.7 12.0(H) 8.2  Hemoglobin 12.0 - 15.0 g/dL 04/12/2020 11.4(L) 12.7  Hematocrit 36.0 - 46.0 % 39.9 36.1 38.7  Platelets 150 - 400 K/uL 137(L) 223 187       Assessment and Plan: Susan Terry is a pleasant 45 y.o. female with cervical discitis osteomyelitis. Her symptoms are improving.  She does not need acute surgery.  If her symptoms continue to improve, we may not need to intervene.  We will continue to monitor.  If she has a change in status, please contact me or my partner on call.  Veneda Kirksey K. Myer Haff MD, MPHS Dept. of Neurosurgery

## 2020-04-11 NOTE — Progress Notes (Signed)
PHARMACIST - PHYSICIAN COMMUNICATION  DR: Renae Gloss  CONCERNING: IV to Oral Route Change Policy  RECOMMENDATION: This patient is receiving pantoprazole by the intravenous route.  Based on criteria approved by the Pharmacy and Therapeutics Committee, the intravenous medication(s) is/are being converted to the equivalent oral dose form(s).   DESCRIPTION: These criteria include:  The patient is eating (either orally or via tube) and/or has been taking other orally administered medications for a least 24 hours  The patient has no evidence of active gastrointestinal bleeding or impaired GI absorption (gastrectomy, short bowel, patient on TNA or NPO).  If you have questions about this conversion, please contact the Pharmacy Department  []   [phone] )  ( [phone] [x]   [phone] )  [facility] []   [phone] )  [facility] []   [phone] )  [facility] []   [phone] )  [facility]   ( [phone], [facility] 04/11/2020 10:49 AM

## 2020-04-11 NOTE — Plan of Care (Signed)

## 2020-04-11 NOTE — Progress Notes (Signed)
Date of Admission:  03/31/2020     ID: Susan Terry is a 45 y.o. female  Principal Problem:   Osteomyelitis of vertebra of cervical region [facility]) Active Problems:   Discitis of cervical region   Tobacco use disorder   IVDU (intravenous drug user)   Heroin abuse (HCC)   Pain in neck    Subjective: C/o pain and numbness arms and hands Rt > left No weakness No fever  Medications:  . diclofenac  1 patch Transdermal BID  . enoxaparin (LOVENOX) injection  40 mg Subcutaneous Q24H  . folic acid  1 mg Oral Daily  . multivitamin with minerals  1 tablet Oral Daily  . nicotine  21 mg Transdermal Daily  . pantoprazole (PROTONIX) IV  40 mg Intravenous Q24H  . sodium chloride flush  10-40 mL Intracatheter Q12H  . thiamine  100 mg Oral Daily   Or  . thiamine  100 mg Intravenous Daily    Objective: Vital signs in last 24 hours: Temp:  [97.7 F (36.5 C)-98.8 F (37.1 C)] 98.3 F (36.8 C) (03/16 0815) Pulse Rate:  [88-103] 91 (03/16 0815) Resp:  [16-18] 16 (03/16 0815) BP: (119-150)/(65-86) 119/65 (03/16 0815) SpO2:  [99 %-100 %] 100 % (03/16 0815)  PHYSICAL EXAM:  General: Alert, cooperative, some  distress,  Head: Normocephalic, without obvious abnormality, atraumatic. Lungs: Clear to auscultation bilaterally. No Wheezing or Rhonchi. No rales. Heart: Regular rate and rhythm, no murmur, rub or gallop. Abdomen: Soft, non-tender,not distended. Bowel sounds normal. No masses Extremities: rt arm some swelling Skin: No rashes or lesions. Or bruising Lymph: Cervical, supraclavicular normal. Neurologic:decreased sensation to touch rt arm, hand and left arm Power 4/5 equal both arms, hands  Lab Results Recent Labs    04/10/20 0818 04/11/20 0837  WBC 8.3 8.7  HGB 11.8* 11.1*  HCT 36.3 34.7*  NA 135 136  K 4.4 4.5  CL 102 103  CO2 26 24  BUN 13 12  CREATININE 0.63 0.57    Microbiology: 03/31/20 BC- NG 04/02/20 wound culture MRSA Studies/Results: MR CERVICAL SPINE W WO  CONTRAST  Result Date: 04/10/2020 CLINICAL DATA:  Discitis/osteomyelitis, follow-up EXAM: MRI CERVICAL SPINE WITHOUT AND WITH CONTRAST TECHNIQUE: Multiplanar and multiecho pulse sequences of the cervical spine, to include the craniocervical junction and cervicothoracic junction, were obtained without and with intravenous contrast. CONTRAST:  43mL GADAVIST GADOBUTROL 1 MMOL/ML IV SOLN COMPARISON:  03/31/2020, 04/01/2020 FINDINGS: Alignment: Stable with retrolisthesis at C5-C6. Vertebrae: Increased abnormal signal and enhancement of the C5 and C6 vertebral bodies. Persistent endplate irregularity/erosion. Ventral epidural STIR hyperintensity and enhancement spanning primarily C4-C5 to C7-T1. There is also slightly improved dorsal epidural enhancement from C3 to C5. Normal marrow signal is otherwise preserved. Cord: No abnormal signal. Posterior Fossa, vertebral arteries, paraspinal tissues: Increased prevertebral fluid but similar enhancement spanning C2 to upper thoracic levels but greatest at the mid cervical spine. Otherwise unremarkable. Disc levels: Multilevel degenerative changes are stable in the short interval. These changes and superimposed above findings at C5-C6 result in similar marked canal stenosis. Increased ventral epidural phlegmon at C6 and C7 levels causes moderate to marked stenosis at C6 and mild stenosis at C7. There is likely foraminal involvement at C5-C6 and C6-C7. IMPRESSION: Evolving changes of discitis/osteomyelitis at C5-C6. Persistent prevertebral phlegmon spanning C2 to upper thoracic levels. Increased ventral epidural phlegmon spanning C4-C5 to C7-T1. Slightly decreased dorsal epidural phlegmon at C3-C5. No abscess. Persistent marked canal stenosis at C5-C6. Increased moderate to marked stenosis dorsal to  C6. Probable foraminal involvement at C5-C6 and C6-C7. Electronically Signed   By: Guadlupe Spanish M.D.   On: 04/10/2020 08:37     Assessment/Plan: 45 year old female with history  of IVDA presents with cervical pain and fever  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  Patient is getting cefepime and daptomycin.  The only culture that was positive was from a wound on the arm which was MRSA.  She initially was on vancomycin which was changed to daptomycin at her insistence because she thought vancomycin was causing burning and increasing pain. Daptomycin is not the first line of therapy for MRSA epidural abscess.  Bioavailability in the CSF space may be diminished.  As WBC normalized and and no fever she is continuing the same.  Repeat MRI was done yesterday.  It showed evolving changes of discitis osteomyelitis at C5-C6.  Persistent prevertebral phlegmon spanning C2 to upper thoracic levels.  Increased ventral epidural phlegmon spanning C4-C5 to C7-T1.  Slightly decreased dorsal epidural phlegmon at C3-C5.  No abscess. Persistent marked canal stenosis at C5-C6.  Increased moderate to marked stenosis dorsal to C6.  Probable foraminal involvement at C5-C6 and C6-C7. Discussed with neurosurgery.  They will evaluate the patient. 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

## 2020-04-11 NOTE — Plan of Care (Signed)
  Problem: Education: Goal: Knowledge of General Education information will improve Description: Including pain rating scale, medication(s)/side effects and non-pharmacologic comfort measures 04/11/2020 1711 by Jean Rosenthal, RN Outcome: Progressing 04/11/2020 1108 by Jean Rosenthal, RN Outcome: Progressing   Problem: Health Behavior/Discharge Planning: Goal: Ability to manage health-related needs will improve 04/11/2020 1711 by Jean Rosenthal, RN Outcome: Progressing 04/11/2020 1108 by Jean Rosenthal, RN Outcome: Progressing   Problem: Clinical Measurements: Goal: Ability to maintain clinical measurements within normal limits will improve 04/11/2020 1711 by Jean Rosenthal, RN Outcome: Progressing 04/11/2020 1108 by Jean Rosenthal, RN Outcome: Progressing Goal: Will remain free from infection 04/11/2020 1711 by Jean Rosenthal, RN Outcome: Progressing 04/11/2020 1108 by Jean Rosenthal, RN Outcome: Progressing Goal: Diagnostic test results will improve 04/11/2020 1711 by Jean Rosenthal, RN Outcome: Progressing 04/11/2020 1108 by Jean Rosenthal, RN Outcome: Progressing Goal: Respiratory complications will improve 04/11/2020 1711 by Jean Rosenthal, RN Outcome: Progressing 04/11/2020 1108 by Jean Rosenthal, RN Outcome: Progressing Goal: Cardiovascular complication will be avoided 04/11/2020 1711 by Jean Rosenthal, RN Outcome: Progressing 04/11/2020 1108 by Jean Rosenthal, RN Outcome: Progressing   Problem: Clinical Measurements: Goal: Respiratory complications will improve 04/11/2020 1711 by Jean Rosenthal, RN Outcome: Progressing 04/11/2020 1108 by Jean Rosenthal, RN Outcome: Progressing   Problem: Clinical Measurements: Goal: Diagnostic test results will improve 04/11/2020 1711 by Jean Rosenthal, RN Outcome: Progressing 04/11/2020 1108 by Jean Rosenthal, RN Outcome: Progressing   Problem: Activity: Goal: Risk for activity  intolerance will decrease 04/11/2020 1711 by Jean Rosenthal, RN Outcome: Progressing 04/11/2020 1108 by Jean Rosenthal, RN Outcome: Progressing   Problem: Nutrition: Goal: Adequate nutrition will be maintained 04/11/2020 1711 by Jean Rosenthal, RN Outcome: Progressing 04/11/2020 1108 by Jean Rosenthal, RN Outcome: Progressing   Problem: Coping: Goal: Level of anxiety will decrease 04/11/2020 1711 by Jean Rosenthal, RN Outcome: Progressing 04/11/2020 1108 by Jean Rosenthal, RN Outcome: Progressing   Problem: Elimination: Goal: Will not experience complications related to bowel motility 04/11/2020 1711 by Jean Rosenthal, RN Outcome: Progressing 04/11/2020 1108 by Jean Rosenthal, RN Outcome: Progressing Goal: Will not experience complications related to urinary retention 04/11/2020 1711 by Jean Rosenthal, RN Outcome: Progressing 04/11/2020 1108 by Jean Rosenthal, RN Outcome: Progressing   Problem: Pain Managment: Goal: General experience of comfort will improve 04/11/2020 1711 by Jean Rosenthal, RN Outcome: Progressing 04/11/2020 1108 by Jean Rosenthal, RN Outcome: Progressing   Problem: Safety: Goal: Ability to remain free from injury will improve 04/11/2020 1711 by Jean Rosenthal, RN Outcome: Progressing 04/11/2020 1108 by Jean Rosenthal, RN Outcome: Progressing   Problem: Skin Integrity: Goal: Risk for impaired skin integrity will decrease 04/11/2020 1711 by Jean Rosenthal, RN Outcome: Progressing 04/11/2020 1108 by Jean Rosenthal, RN Outcome: Progressing

## 2020-04-11 NOTE — Progress Notes (Signed)
Patient ID: Susan Terry, female   DOB: 1975-03-05, 45 y.o.   MRN: [id number] [facility] PROGRESS NOTE  Susan Terry YJE:[id number] DOB: 09-Apr-1975 DOA: 03/31/2020 [facility]  HPI/Subjective: Patient still having a lot of neck pain.  Patient states that she is able to walk around.  Admitted 03/31/2020 with osteomyelitis and discitis.  Objective: Vitals:   04/11/20 0815 04/11/20 1157  BP: 119/65 135/75  Pulse: 91 95  Resp: 16 18  Temp: 98.3 F (36.8 C) 98.2 F (36.8 C)  SpO2: 100% 100%   No intake or output data in the 24 hours ending 04/11/20 1524 Filed Weights   03/31/20 1150 04/02/20 0500  Weight: 63.5 kg 67.8 kg    ROS: Review of Systems  Respiratory: Negative for shortness of breath.   Cardiovascular: Negative for chest pain.  Gastrointestinal: Negative for abdominal pain, nausea and vomiting.  Musculoskeletal: Positive for neck pain.   Exam: Physical Exam HENT:     Head: Normocephalic.     Mouth/Throat:     Pharynx: No oropharyngeal exudate.  Eyes:     General: Lids are normal.     Conjunctiva/sclera: Conjunctivae normal.  Cardiovascular:     Rate and Rhythm: Normal rate and regular rhythm.     Heart sounds: Normal heart sounds, S1 normal and S2 normal.  Pulmonary:     Breath sounds: Normal breath sounds. No decreased breath sounds, wheezing, rhonchi or rales.  Abdominal:     Palpations: Abdomen is soft.     Tenderness: There is no abdominal tenderness.  Musculoskeletal:     Right ankle: No swelling.     Left ankle: No swelling.  Skin:    General: Skin is warm.     Findings: No rash.  Neurological:     Mental Status: She is alert and oriented to person, place, and time.       Data Reviewed: Basic Metabolic Panel: Recent Labs  Lab 04/06/20 0810 04/07/20 0725 04/08/20 0431 04/10/20 0818 04/11/20 0837  NA 132* 133* 131* 135 136  K 3.6 4.5 4.6 4.4 4.5  CL 100 97* 96* 102 103  CO2 24 27 26 26 24   GLUCOSE 115* 111* 99 83 115*   BUN 7 10 14 13 12   CREATININE 0.52 0.81 0.46 0.63 0.57  CALCIUM 8.4* 9.1 9.1 8.7* 8.9  PHOS 3.0  --   --   --   --    CBC: Recent Labs  Lab 04/06/20 0810 04/07/20 0725 04/08/20 0431 04/10/20 0818 04/11/20 0837  WBC 10.6* 11.2* 12.5* 8.3 8.7  HGB 11.9* 13.2 12.9 11.8* 11.1*  HCT 35.7* 39.6 38.3 36.3 34.7*  MCV 79.5* 79.5* 79.8* 82.3 83.2  PLT 277 302 338 324 PLATELET CLUMPS NOTED ON SMEAR, UNABLE TO ESTIMATE   Cardiac Enzymes: Recent Labs  Lab 04/06/20 0810  CKTOTAL 13*     Recent Results (from the past 240 hour(s))  MRSA PCR Screening     Status: None   Collection Time: 04/02/20 11:40 AM   Specimen: Nasal Mucosa; Nasopharyngeal  Result Value Ref Range Status   MRSA by PCR NEGATIVE NEGATIVE Final    Comment:        The GeneXpert MRSA Assay (FDA approved for NASAL specimens only), is one component of a comprehensive MRSA colonization surveillance program. It is not intended to diagnose MRSA infection nor to guide or monitor treatment for MRSA infections. Performed at [facility], [address], [place], [address] [place]   Aerobic Culture w Gram  Stain (superficial specimen)     Status: None (Preliminary result)   Collection Time: 04/02/20  7:23 PM   Specimen: Hand; Wound  Result Value Ref Range Status   Specimen Description   Final    HAND Performed at [facility], [address] [place], [place] [address]    Special Requests   Final    NONE Performed at [facility], [address] [place], [place] [address]    Gram Stain   Final    MODERATE WBC PRESENT, PREDOMINANTLY PMN RARE GRAM POSITIVE COCCI    Culture   Final    MODERATE METHICILLIN RESISTANT STAPHYLOCOCCUS AUREUS CULTURE REINCUBATED FOR BETTER GROWTH Performed at [facility], [address] [place], [place] [address]    Report Status PENDING  Incomplete   Organism ID, Bacteria METHICILLIN RESISTANT STAPHYLOCOCCUS AUREUS  Final      Susceptibility    Methicillin resistant staphylococcus aureus - MIC*    CIPROFLOXACIN >=8 RESISTANT Resistant     ERYTHROMYCIN >=8 RESISTANT Resistant     GENTAMICIN >=16 RESISTANT Resistant     OXACILLIN >=4 RESISTANT Resistant     TETRACYCLINE >=16 RESISTANT Resistant     VANCOMYCIN 1 SENSITIVE Sensitive     TRIMETH/SULFA >=320 RESISTANT Resistant     CLINDAMYCIN >=8 RESISTANT Resistant     RIFAMPIN <=0.5 SENSITIVE Sensitive     Inducible Clindamycin NEGATIVE Sensitive     LINEZOLID Value in next row Sensitive      SENSITIVE2    QUINUPRISTIN/DALFOPRISTIN Value in next row Sensitive      SENSITIVE0.25    * MODERATE METHICILLIN RESISTANT STAPHYLOCOCCUS AUREUS  Culture, blood (Routine X 2) w Reflex to ID Panel     Status: None   Collection Time: 04/02/20 11:06 PM   Specimen: BLOOD  Result Value Ref Range Status   Specimen Description BLOOD RIGHT HAND  Final   Special Requests   Final    BOTTLES DRAWN AEROBIC AND ANAEROBIC Blood Culture adequate volume   Culture   Final    NO GROWTH 5 DAYS Performed at [facility], [address], [place], [place] [address]    Report Status 04/07/2020 FINAL  Final  Culture, blood (Routine X 2) w Reflex to ID Panel     Status: None   Collection Time: 04/02/20 11:06 PM   Specimen: BLOOD  Result Value Ref Range Status   Specimen Description BLOOD LEFT [facility]  Final   Special Requests   Final    BOTTLES DRAWN AEROBIC AND ANAEROBIC Blood Culture adequate volume   Culture   Final    NO GROWTH 5 DAYS Performed at [facility], [address] [place], [place] [address]    Report Status 04/07/2020 FINAL  Final     Studies: MR CERVICAL SPINE W WO CONTRAST  Result Date: 04/10/2020 CLINICAL DATA:  Discitis/osteomyelitis, follow-up EXAM: MRI CERVICAL SPINE WITHOUT AND WITH CONTRAST TECHNIQUE: Multiplanar and multiecho pulse sequences of the cervical spine, to include the craniocervical junction and cervicothoracic junction, were obtained without and  with intravenous contrast. CONTRAST:  69mL GADAVIST GADOBUTROL 1 MMOL/ML IV SOLN COMPARISON:  03/31/2020, 04/01/2020 FINDINGS: Alignment: Stable with retrolisthesis at C5-C6. Vertebrae: Increased abnormal signal and enhancement of the C5 and C6 vertebral bodies. Persistent endplate irregularity/erosion. Ventral epidural STIR hyperintensity and enhancement spanning primarily C4-C5 to C7-T1. There is also slightly improved dorsal epidural enhancement from C3 to C5. Normal marrow signal is otherwise preserved. Cord: No abnormal signal. Posterior Fossa,  vertebral arteries, paraspinal tissues: Increased prevertebral fluid but similar enhancement spanning C2 to upper thoracic levels but greatest at the mid cervical spine. Otherwise unremarkable. Disc levels: Multilevel degenerative changes are stable in the short interval. These changes and superimposed above findings at C5-C6 result in similar marked canal stenosis. Increased ventral epidural phlegmon at C6 and C7 levels causes moderate to marked stenosis at C6 and mild stenosis at C7. There is likely foraminal involvement at C5-C6 and C6-C7. IMPRESSION: Evolving changes of discitis/osteomyelitis at C5-C6. Persistent prevertebral phlegmon spanning C2 to upper thoracic levels. Increased ventral epidural phlegmon spanning C4-C5 to C7-T1. Slightly decreased dorsal epidural phlegmon at C3-C5. No abscess. Persistent marked canal stenosis at C5-C6. Increased moderate to marked stenosis dorsal to C6. Probable foraminal involvement at C5-C6 and C6-C7. Electronically Signed   By: Guadlupe Spanish M.D.   On: 04/10/2020 08:37    Scheduled Meds: . diclofenac  1 patch Transdermal BID  . enoxaparin (LOVENOX) injection  40 mg Subcutaneous Q24H  . folic acid  1 mg Oral Daily  . multivitamin with minerals  1 tablet Oral Daily  . nicotine  21 mg Transdermal Daily  . pantoprazole  40 mg Oral Daily  . sodium chloride flush  10-40 mL Intracatheter Q12H  . thiamine  100 mg Oral Daily    Continuous Infusions: . sodium chloride 250 mL (04/08/20 1514)  . ceFEPime (MAXIPIME) IV 2 g (04/11/20 0444)  . DAPTOmycin (CUBICIN)  IV 550 mg (04/10/20 1958)    Assessment/Plan:  1. Osteomyelitis and discitis of C5-C6.  Prevertebral phlegmon spanning from C2 to upper third thoracic levels.  Patient has a history of IV drug abuse.  Patient on the Comycin and Maxipime.  Initial recommendations were 6 weeks of IV antibiotics.  Infectious disease may decide after 3 weeks to do weekly IV medications along with oral antibiotics. 2. Right hand mild cellulitis on IV antibiotics.  Hand culture growing MRSA also. 3. Leukocytosis.  Improved 4. IVDA.  History of heroin injections. 5. Hypophosphatemia and hyponatremia both of improved        Code Status:     Code Status Orders  (From admission, onward)         Start     Ordered   03/31/20 1929  Full code  Continuous        03/31/20 1928        Code Status History    Date Active Date Inactive Code Status Order ID Comments User Context   04/06/2017 0315 04/08/2017 1917 Full Code [id number]  Arnaldo Natal, MD Inpatient   Advance Care Planning Activity      Disposition Plan: Status is: Inpatient  Dispo: The patient is from: Home              Anticipated d/c is to: Home after antibiotic course.              Patient currently on IV antibiotics for osteomyelitis   Difficult to place patient.  No.  Consultants:  Infectious disease  Neurosurgery  Antibiotics: -Vancomycin and cefepime  Time spent: 28 minutes  [employer]

## 2020-04-12 DIAGNOSIS — L03119 Cellulitis of unspecified part of limb: Secondary | ICD-10-CM

## 2020-04-12 MED ORDER — HYDROMORPHONE HCL 1 MG/ML IJ SOLN
1.5000 mg | INTRAMUSCULAR | Status: DC | PRN
  Administered 2020-04-12 – 2020-04-13 (×7): 1.5 mg via INTRAVENOUS
  Filled 2020-04-12 (×7): qty 2

## 2020-04-12 MED ORDER — HYDROMORPHONE HCL 1 MG/ML IJ SOLN: 1.0000 mg | INTRAMUSCULAR | Status: DC | PRN

## 2020-04-12 NOTE — Progress Notes (Signed)
Pt noted to pretend to take her oxycodone, pt put the pills in her bed, when pt questioned about this she states that she wanted to save meds for later, pt made aware that she can not do that, states that she will take the meds, pt took meds and let me look in her mouth, pills were swallowed, primary RN and MD made aware of this

## 2020-04-12 NOTE — Progress Notes (Signed)
Patient ID: Susan Terry, female   DOB: May 28, 1975, 45 y.o.   MRN: [id number] [facility] PROGRESS NOTE  Susan Terry NUU:[id number] DOB: 04/06/1975 DOA: 03/31/2020 [facility]  HPI/Subjective: Patient states that she still has a lot of pain in her neck into her shoulder blades on top of the shoulders.  States she needs the IV medications.  Upset that she did not get her medications overnight.  Admitted with osteomyelitis and discitis C5-C6.  Objective: Vitals:   04/12/20 0823 04/12/20 1156  BP: 121/65 116/68  Pulse: 90 89  Resp: 18 16  Temp: 98.5 F (36.9 C) 98.1 F (36.7 C)  SpO2: 100% 100%   No intake or output data in the 24 hours ending 04/12/20 1408 Filed Weights   03/31/20 1150 04/02/20 0500  Weight: 63.5 kg 67.8 kg    ROS: Review of Systems  Respiratory: Negative for cough and shortness of breath.   Cardiovascular: Negative for chest pain.  Gastrointestinal: Negative for abdominal pain and nausea.  Musculoskeletal: Positive for back pain and neck pain.   Exam: Physical Exam Exam conducted with a chaperone present.  HENT:     Head: Normocephalic.     Mouth/Throat:     Pharynx: No oropharyngeal exudate.  Eyes:     General: Lids are normal.     Conjunctiva/sclera: Conjunctivae normal.  Cardiovascular:     Rate and Rhythm: Normal rate and regular rhythm.     Heart sounds: Normal heart sounds, S1 normal and S2 normal.  Pulmonary:     Breath sounds: Normal breath sounds. No decreased breath sounds, wheezing, rhonchi or rales.  Abdominal:     Palpations: Abdomen is soft.     Tenderness: There is no abdominal tenderness.  Musculoskeletal:     Right lower leg: No swelling.     Left lower leg: No swelling.     Comments: Slight pain to palpation over lower cervical spine.  Skin:    General: Skin is warm.     Findings: No rash.  Neurological:     Mental Status: She is alert and oriented to person, place, and time.       Data Reviewed: Basic  Metabolic Panel: Recent Labs  Lab 04/06/20 0810 04/07/20 0725 04/08/20 0431 04/10/20 0818 04/11/20 0837  NA 132* 133* 131* 135 136  K 3.6 4.5 4.6 4.4 4.5  CL 100 97* 96* 102 103  CO2 24 27 26 26 24   GLUCOSE 115* 111* 99 83 115*  BUN 7 10 14 13 12   CREATININE 0.52 0.81 0.46 0.63 0.57  CALCIUM 8.4* 9.1 9.1 8.7* 8.9  PHOS 3.0  --   --   --   --    CBC: Recent Labs  Lab 04/06/20 0810 04/07/20 0725 04/08/20 0431 04/10/20 0818 04/11/20 0837  WBC 10.6* 11.2* 12.5* 8.3 8.7  HGB 11.9* 13.2 12.9 11.8* 11.1*  HCT 35.7* 39.6 38.3 36.3 34.7*  MCV 79.5* 79.5* 79.8* 82.3 83.2  PLT 277 302 338 324 PLATELET CLUMPS NOTED ON SMEAR, UNABLE TO ESTIMATE   Cardiac Enzymes: Recent Labs  Lab 04/06/20 0810  CKTOTAL 13*     Recent Results (from the past 240 hour(s))  Aerobic Culture w Gram Stain (superficial specimen)     Status: None (Preliminary result)   Collection Time: 04/02/20  7:23 PM   Specimen: Hand; Wound  Result Value Ref Range Status   Specimen Description   Final    HAND Performed at [facility], [address] [place],  [place] [address]    Special Requests   Final    NONE Performed at [facility], [address] [place], [place] [address]    Gram Stain   Final    MODERATE WBC PRESENT, PREDOMINANTLY PMN RARE GRAM POSITIVE COCCI    Culture   Final    MODERATE METHICILLIN RESISTANT STAPHYLOCOCCUS AUREUS CULTURE REINCUBATED FOR BETTER GROWTH Performed at [facility], [address] [place], [place] [address]    Report Status PENDING  Incomplete   Organism ID, Bacteria METHICILLIN RESISTANT STAPHYLOCOCCUS AUREUS  Final      Susceptibility   Methicillin resistant staphylococcus aureus - MIC*    CIPROFLOXACIN >=8 RESISTANT Resistant     ERYTHROMYCIN >=8 RESISTANT Resistant     GENTAMICIN >=16 RESISTANT Resistant     OXACILLIN >=4 RESISTANT Resistant     TETRACYCLINE >=16 RESISTANT Resistant     VANCOMYCIN 1 SENSITIVE Sensitive      TRIMETH/SULFA >=320 RESISTANT Resistant     CLINDAMYCIN >=8 RESISTANT Resistant     RIFAMPIN <=0.5 SENSITIVE Sensitive     Inducible Clindamycin NEGATIVE Sensitive     LINEZOLID Value in next row Sensitive      SENSITIVE2    QUINUPRISTIN/DALFOPRISTIN Value in next row Sensitive      SENSITIVE0.25    * MODERATE METHICILLIN RESISTANT STAPHYLOCOCCUS AUREUS  Culture, blood (Routine X 2) w Reflex to ID Panel     Status: None   Collection Time: 04/02/20 11:06 PM   Specimen: BLOOD  Result Value Ref Range Status   Specimen Description BLOOD RIGHT HAND  Final   Special Requests   Final    BOTTLES DRAWN AEROBIC AND ANAEROBIC Blood Culture adequate volume   Culture   Final    NO GROWTH 5 DAYS Performed at [facility], [address] [place], [place] [address]    Report Status 04/07/2020 FINAL  Final  Culture, blood (Routine X 2) w Reflex to ID Panel     Status: None   Collection Time: 04/02/20 11:06 PM   Specimen: BLOOD  Result Value Ref Range Status   Specimen Description BLOOD LEFT [facility]  Final   Special Requests   Final    BOTTLES DRAWN AEROBIC AND ANAEROBIC Blood Culture adequate volume   Culture   Final    NO GROWTH 5 DAYS Performed at [facility], [address] [place], [place] [address]    Report Status 04/07/2020 FINAL  Final      Scheduled Meds: . diclofenac  1 patch Transdermal BID  . enoxaparin (LOVENOX) injection  40 mg Subcutaneous Q24H  . folic acid  1 mg Oral Daily  . multivitamin with minerals  1 tablet Oral Daily  . nicotine  21 mg Transdermal Daily  . pantoprazole  40 mg Oral Daily  . sodium chloride flush  10-40 mL Intracatheter Q12H  . thiamine  100 mg Oral Daily   Continuous Infusions: . sodium chloride 250 mL (04/08/20 1514)  . ceFEPime (MAXIPIME) IV 2 g (04/12/20 0556)  . DAPTOmycin (CUBICIN)  IV Stopped (04/12/20 0815)    Assessment/Plan:  1. Osteomyelitis and discitis of C5-C6.  Epidural and prevertebral phlegmon.  Patient on  daptomycin and Maxipime.  Ready to go tomorrow antibiotics as per infectious disease.  Likely will need 6 weeks of IV antibiotics but may end up deciding on 3 weeks of antibiotics and potentially another 4 weeks of once a week IV medication through the infusion center and oral antibiotics.  2. Neck pain.  Patient on IV Dilaudid.  Patient upset that she did not get good pain control overnight.  Patient okay with increasing the frequency of the Dilaudid.  Showed some interest in moving to Suboxone.  I spoke with my associates about converting the patient over to Suboxone and will talk more about this with the patient tomorrow. 3. Right hand mild cellulitis on IV antibiotics.  Hand culture growing out MRSA. 4. Leukocytosis normalized. 5. IVDA 6. Hypophosphatemia and hyponatremia.  Both of normalized.        Code Status:     Code Status Orders  (From admission, onward)         Start     Ordered   03/31/20 1929  Full code  Continuous        03/31/20 1928        Code Status History    Date Active Date Inactive Code Status Order ID Comments User Context   04/06/2017 0315 04/08/2017 1917 Full Code [id number]  Arnaldo Natal, MD Inpatient   Advance Care Planning Activity     Disposition Plan: Status is: Inpatient  Dispo: The patient is from: Home              Anticipated d/c is to: Home              Patient currently on IV antibiotics for osteomyelitis and discitis.  Will need most of her course here in the hospital.   Difficult to place patient. No.  Time spent: 27 minutes  [employer]

## 2020-04-13 LAB — CBC
HCT: 35.1 % — ABNORMAL LOW (ref 36.0–46.0)
Hemoglobin: 11.4 g/dL — ABNORMAL LOW (ref 12.0–15.0)
MCH: 26.7 pg (ref 26.0–34.0)
MCHC: 32.5 g/dL (ref 30.0–36.0)
MCV: 82.2 fL (ref 80.0–100.0)
Platelets: 317 10*3/uL (ref 150–400)
RBC: 4.27 MIL/uL (ref 3.87–5.11)
RDW: 14.5 % (ref 11.5–15.5)
WBC: 7.8 10*3/uL (ref 4.0–10.5)
nRBC: 0 % (ref 0.0–0.2)

## 2020-04-13 LAB — BASIC METABOLIC PANEL
Anion gap: 6 (ref 5–15)
BUN: 20 mg/dL (ref 6–20)
CO2: 28 mmol/L (ref 22–32)
Calcium: 9.1 mg/dL (ref 8.9–10.3)
Chloride: 102 mmol/L (ref 98–111)
Creatinine, Ser: 0.52 mg/dL (ref 0.44–1.00)
GFR, Estimated: >60 mL/min (ref >60)
Glucose, Bld: 95 mg/dL (ref 70–99)
Potassium: 4.4 mmol/L (ref 3.5–5.1)
Sodium: 136 mmol/L (ref 135–145)

## 2020-04-13 LAB — MISC LABCORP TEST (SEND OUT)
LabCorp test name: 96388
Labcorp test code: 96388

## 2020-04-13 LAB — SEDIMENTATION RATE: Sed Rate: 60 mm/hr — ABNORMAL HIGH (ref 0–20)

## 2020-04-13 LAB — CK: Total CK: 14 U/L — ABNORMAL LOW (ref 38–234)

## 2020-04-13 MED ORDER — VANCOMYCIN HCL 1500 MG/300ML IV SOLN
1500.0000 mg | Freq: Once | INTRAVENOUS | Status: AC
  Administered 2020-04-14: 06:00:00 1500 mg via INTRAVENOUS
  Filled 2020-04-13: qty 300

## 2020-04-13 MED ORDER — BUPRENORPHINE HCL-NALOXONE HCL 8-2 MG SL SUBL
1.0000 | SUBLINGUAL_TABLET | Freq: Two times a day (BID) | SUBLINGUAL | Status: DC
  Administered 2020-04-13 – 2020-04-17 (×9): 1 via SUBLINGUAL
  Filled 2020-04-13 (×9): qty 1

## 2020-04-13 MED ORDER — VANCOMYCIN HCL 1000 MG/200ML IV SOLN
1000.0000 mg | Freq: Three times a day (TID) | INTRAVENOUS | Status: DC
  Administered 2020-04-14 – 2020-04-17 (×10): 1000 mg via INTRAVENOUS
  Filled 2020-04-13 (×12): qty 200

## 2020-04-13 NOTE — Progress Notes (Addendum)
Patient ID: Susan Terry, female   DOB: [birth year], 45 y.o.   MRN: [id number] [facility] PROGRESS NOTE  Susan Terry [id number] DOB: [birth year] DOA: 03/31/2020 [facility]  HPI/Subjective: Patient still has neck pain radiating down into her shoulder blades.  Patient interested in converting from IV Dilaudid over to Suboxone.  Patient admitted with osteomyelitis.  Objective: Vitals:   04/13/20 0756 04/13/20 1143  BP: 106/72 108/71  Pulse: 79 85  Resp: 18 18  Temp: (!) 97.4 F (36.3 C) 98.4 F (36.9 C)  SpO2: 97% 99%   No intake or output data in the 24 hours ending 04/13/20 1445 Filed Weights   03/31/20 1150 04/02/20 0500  Weight: 63.5 kg 67.8 kg    ROS: Review of Systems  Gastrointestinal: Negative for abdominal pain and constipation.   Exam: Physical Exam Exam conducted with a chaperone present.  HENT:     Head: Normocephalic.  Eyes:     General: Lids are normal.     Conjunctiva/sclera: Conjunctivae normal.  Cardiovascular:     Rate and Rhythm: Normal rate and regular rhythm.     Heart sounds: Normal heart sounds, S1 normal and S2 normal.  Pulmonary:     Breath sounds: Normal breath sounds. No decreased breath sounds, wheezing, rhonchi or rales.  Abdominal:     Palpations: Abdomen is soft.     Tenderness: There is no abdominal tenderness.  Musculoskeletal:     Right lower leg: No swelling.     Left lower leg: No swelling.  Skin:    General: Skin is warm.     Findings: No rash.  Neurological:     Mental Status: She is alert and oriented to person, place, and time.       Data Reviewed: Basic Metabolic Panel: Recent Labs  Lab 04/07/20 0725 04/08/20 0431 04/10/20 0818 04/11/20 0837 04/13/20 0723  NA 133* 131* 135 136 136  K 4.5 4.6 4.4 4.5 4.4  CL 97* 96* 102 103 102  CO2 27 26 26 24 28   GLUCOSE 111* 99 83 115* 95  BUN 10 14 13 12 20   CREATININE 0.81 0.46 0.63 0.57 0.52  CALCIUM 9.1 9.1 8.7* 8.9 9.1    CBC: Recent Labs   Lab 04/07/20 0725 04/08/20 0431 04/10/20 0818 04/11/20 0837 04/13/20 0723  WBC 11.2* 12.5* 8.3 8.7 7.8  HGB 13.2 12.9 11.8* 11.1* 11.4*  HCT 39.6 38.3 36.3 34.7* 35.1*  MCV 79.5* 79.8* 82.3 83.2 82.2  PLT 302 338 324 PLATELET CLUMPS NOTED ON SMEAR, UNABLE TO ESTIMATE 317   Cardiac Enzymes: Recent Labs  Lab 04/13/20 0723  CKTOTAL 14*    Scheduled Meds: . buprenorphine-naloxone  1 tablet Sublingual BID  . diclofenac  1 patch Transdermal BID  . enoxaparin (LOVENOX) injection  40 mg Subcutaneous Q24H  . folic acid  1 mg Oral Daily  . multivitamin with minerals  1 tablet Oral Daily  . nicotine  21 mg Transdermal Daily  . pantoprazole  40 mg Oral Daily  . sodium chloride flush  10-40 mL Intracatheter Q12H  . thiamine  100 mg Oral Daily   Continuous Infusions: . sodium chloride 250 mL (04/08/20 1514)  . ceFEPime (MAXIPIME) IV 2 g (04/13/20 0547)  . DAPTOmycin (CUBICIN)  IV 550 mg (04/12/20 2128)    Assessment/Plan:  1. Osteomyelitis and discitis of C5-C6.  Patient has epidural and prevertebral phlegmon.  Patient on daptomycin and Maxipime.  Case discussed with infectious disease and likely will need 6 weeks of  IV antibiotics here in the hospital. Neurosurgery following. 2. Neck pain.  Patient interested in converting from IV Dilaudid over to Suboxone.  First dose of Suboxone at 1 PM and will give twice a day. 3. Right hand mild cellulitis on IV antibiotics.  MRSA growing.  This has improved. 4. Leukocytosis normalized 5. History of IVDA 6. Previous hypophosphatemia and hyponatremia.  Both normalized. 7. Notified by nursing staff that she declined Lovenox injection.  Will order teds and encourage ambulation.        Code Status:     Code Status Orders  (From admission, onward)         Start     Ordered   03/31/20 1929  Full code  Continuous        03/31/20 1928        Code Status History    Date Active Date Inactive Code Status Order ID Comments User Context    04/06/2017 0315 04/08/2017 1917 Full Code [id number]  Arnaldo Natal, MD Inpatient   Advance Care Planning Activity     Disposition Plan: Status is: Inpatient  Dispo: The patient is from: Home              Anticipated d/c is to: Home after completion of antibiotic course              Patient currently requires IV antibiotics for osteomyelitis and discitis   Difficult to place patient.  No.  Time spent: 28 minutes  Susan Terry [employer]

## 2020-04-13 NOTE — Progress Notes (Signed)
Date of Admission:  03/31/2020  Day 13 of antibiotic    ID: Susan Terry is a 45 y.o. female  Principal Problem:   Osteomyelitis of vertebra of cervical region [facility]) Active Problems:   Discitis of cervical region   Tobacco use disorder   IVDU (intravenous drug user)   Heroin abuse (HCC)   Neck pain   Cellulitis of hand   Leukocytosis    Subjective: Patient is feeling better Pain in her neck and arms better Numbness is only now in her fingers on the right side Patient says she cannot stay for 6 weeks to get IV antibiotics  Medications:  . buprenorphine-naloxone  1 tablet Sublingual BID  . diclofenac  1 patch Transdermal BID  . enoxaparin (LOVENOX) injection  40 mg Subcutaneous Q24H  . folic acid  1 mg Oral Daily  . multivitamin with minerals  1 tablet Oral Daily  . nicotine  21 mg Transdermal Daily  . pantoprazole  40 mg Oral Daily  . sodium chloride flush  10-40 mL Intracatheter Q12H  . thiamine  100 mg Oral Daily    Objective: Vital signs in last 24 hours: Temp:  [97.4 F (36.3 C)-98.7 F (37.1 C)] 97.4 F (36.3 C) (03/18 0756) Pulse Rate:  [75-92] 79 (03/18 0756) Resp:  [16-18] 18 (03/18 0756) BP: (106-126)/(58-82) 106/72 (03/18 0756) SpO2:  [97 %-100 %] 97 % (03/18 0756)  PHYSICAL EXAM:  General: Alert, cooperative, no distress, appears stated age.  Lungs: Clear to auscultation bilaterally. No Wheezing or Rhonchi. No rales. Heart: Regular rate and rhythm, no murmur, rub or gallop. Abdomen: Soft, non-tender,not distended. Bowel sounds normal. No masses Extremities: atraumatic, no cyanosis. No edema. No clubbing Skin: No rashes or lesions. Or bruising Lymph: Cervical, supraclavicular normal. Neurologic: Numbness over the right middle finger  Lab Results Recent Labs    04/11/20 0837 04/13/20 0723  WBC 8.7 7.8  HGB 11.1* 11.4*  HCT 34.7* 35.1*  NA 136 136  K 4.5 4.4  CL 103 102  CO2 24 28  BUN 12 20  CREATININE 0.57 0.52   Sedimentation  Rate Recent Labs    04/13/20 0723  ESRSEDRATE 60*   Microbiology: 03/31/20 BC- NG 04/02/20 wound culture MRSA   Assessment/Plan: 45 year old female with history of IVDA presented with cervical pain and fever.  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  No cultures were obtained from the cervical fluid.  IR did not think it was safe for them to aspirate the fluid.  Neuro surgery talked to patient but she was not interested.  So the antibiotic is based on the wound culture that was taken from the needle puncture site.  It grew MRSA.  So the patient is on daptomycin and also getting gram-negative coverage with cefepime because of her history of Serratia in the past as well as being an IV drug user. Repeat MRI was done on 04/10/2020.  It showed evolving changes of discitis/osteomyelitis at C5-C6.  There was persistent prevertebral phlegmon spanning C2 to upper thoracic levels.  And there was increased ventral epidural phlegmon spanning C4-C5 to C7-T1.  Slightly decreased dorsal epidural phlegmon at C3-C5.  No abscess.  Persistent marked canal stenosis at C5-C6.  Neurosurgery evaluated the patient again and did not think surgical intervention was needed currently.  So continuing on IV antibiotics. Ideally patient will need 6 weeks of IV antibiotics. But patient has clearly said that she cannot stay beyond 3 weeks in  the hospital and would leave if at all other plans are not made.  So we will change the daptomycin to vancomycin starting tomorrow 04/14/2020..  We will give IV vancomycin and IV cefepime until 04/22/2020.  And can be discharged on that day on p.o. linezolid and p.o. ciprofloxacin..  Then on 04/23/2020 which is  Monday we will make arrangements for her to go to day surgery to get dalbavancin IV once a week for 3 weeks.  History of lumbar discitis and osteomyelitis  History of MRSA infection  History of Serratia  infection  Discussed the management with the patient, pharmacist and the hospitalist. ID will follow her peripherally this weekend call if needed.

## 2020-04-13 NOTE — Progress Notes (Signed)
Pharmacy Antibiotic Note  Susan Terry is a 45 y.o. 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 Pharmacy has been consulted for vancomycin dosing. Vancomycin was stopped 3/10 for patient complaints of burning/pain in neck and shoulders, started on daptomycin 3/10   CT reveals C5-6 vertebral OM and epidural phlegmon.  She has PMH of L2-3 vertebral OM w/ MRSA and serratia  Today, 04/13/2020 Day #14 vancomycin/daptomycin and cefepime  Renal: SCr stable  WBC WNL  Tm afebrile  Vancomycin levels performed with 3/8 21:38 dose of vancomycin 1gm IV q12h  Peak = 22 @ 0018  Trough = 8 @ 0933  TTE from 3/7 negative for vegetations  Culture from hand with MRSA   No plans currently for surgical intervention  Plan:   Continue Cefepime to 2 grams IV every 8 hours  Per ID will stop daptomycin after tonight's dose the start Vancomycin 1500mg  IV x 1 on 3/19am  then start 1gm IV q8h based on vancomycin levels from 3/9 to achieve AUC goal AND a vancomycin trough of 15-20 mcg/ml d/t epidural phlegmon/abscess  Goal AUC 400-600  New dose will provide Vanco Peak 33, trough 15, AUC 557  Will plan to recheck vancomycin levels on 3/20   Daily renal function assessment while on IV vancomycin   Height: 5\' 7"  (170.2 cm) Weight: 67.8 kg (149 lb 8 oz) IBW/kg (Calculated) : 61.6  Temp (24hrs), Avg:98.4 F (36.9 C), Min:97.4 F (36.3 C), Max:99.1 F (37.3 C)  Recent Labs  Lab 04/07/20 0725 04/08/20 0431 04/10/20 0818 04/11/20 0837 04/13/20 0723  WBC 11.2* 12.5* 8.3 8.7 7.8  CREATININE 0.81 0.46 0.63 0.57 0.52    Estimated Creatinine Clearance: 89.1 mL/min (by C-G formula based on SCr of 0.52 mg/dL).    No Known Allergies  Antimicrobials this admission: 03/05 cefepime >>  03/05 metronidazole >> 3/7 03/05 vancomycin >>   Microbiology  results: 03/5 BCx: NGTD 3/7 Bcx: NGTD 3/7 Hand cx: MRSA 03/05 SARS CoV-2: negative 03/05 influenza A/B: negative    Thank you for allowing pharmacy to be a part of this patient's care.  05/05, PharmD, BCPS.   Work Cell: [phone] 04/13/2020 5:42 PM

## 2020-04-14 DIAGNOSIS — B9562 Methicillin resistant Staphylococcus aureus infection as the cause of diseases classified elsewhere: Secondary | ICD-10-CM

## 2020-04-14 DIAGNOSIS — L0390 Cellulitis, unspecified: Secondary | ICD-10-CM

## 2020-04-14 LAB — OPIATES,MS,WB/SP RFX
6-Acetylmorphine: NEGATIVE
Codeine: NEGATIVE ng/mL
Dihydrocodeine: NEGATIVE ng/mL
Hydrocodone: NEGATIVE ng/mL
Hydromorphone: NEGATIVE ng/mL
Morphine: NEGATIVE ng/mL
Opiate Confirmation: NEGATIVE

## 2020-04-14 LAB — DRUG SCREEN 10 W/CONF, SERUM
Amphetamines, IA: NEGATIVE ng/mL
Barbiturates, IA: NEGATIVE ug/mL
Benzodiazepines, IA: NEGATIVE ng/mL
Cocaine & Metabolite, IA: NEGATIVE ng/mL
Methadone, IA: NEGATIVE ng/mL
Opiates, IA: NEGATIVE ng/mL
Oxycodones, IA: POSITIVE ng/mL — AB
Phencyclidine, IA: NEGATIVE ng/mL
Propoxyphene, IA: NEGATIVE ng/mL
THC(Marijuana) Metabolite, IA: NEGATIVE ng/mL

## 2020-04-14 LAB — OXYCODONES,MS,WB/SP RFX
Oxycocone: 10.7 ng/mL
Oxycodones Confirmation: POSITIVE
Oxymorphone: NEGATIVE ng/mL

## 2020-04-14 MED ORDER — FLUCONAZOLE 50 MG PO TABS
150.0000 mg | ORAL_TABLET | Freq: Once | ORAL | Status: AC
  Administered 2020-04-14: 14:00:00 150 mg via ORAL
  Filled 2020-04-14: qty 1

## 2020-04-14 NOTE — Progress Notes (Signed)
Patient ID: Susan Terry, female   DOB: [birth year], 45 y.o.   MRN: [id number] [facility] PROGRESS NOTE  Susan Terry NOI:[id number] DOB: 08/02/1975 DOA: 03/31/2020 [facility]  HPI/Subjective: Patient doing much better with regards to her pain with the Suboxone that she did with the Dilaudid.  Still has a little discomfort but better controlled.  Complains of some vaginal discharge.  Objective: Vitals:   04/14/20 1126 04/14/20 1549  BP: 106/64 104/61  Pulse: 91 86  Resp: 18 16  Temp: 98.5 F (36.9 C) 98.7 F (37.1 C)  SpO2: 99% 99%   No intake or output data in the 24 hours ending 04/14/20 1604 Filed Weights   03/31/20 1150 04/02/20 0500  Weight: 63.5 kg 67.8 kg    ROS: Review of Systems  Respiratory: Negative for shortness of breath.   Cardiovascular: Negative for chest pain.  Gastrointestinal: Negative for abdominal pain, nausea and vomiting.  Musculoskeletal: Positive for neck pain.   Exam: Physical Exam HENT:     Head: Normocephalic.     Mouth/Throat:     Pharynx: No oropharyngeal exudate.  Eyes:     General: Lids are normal.     Conjunctiva/sclera: Conjunctivae normal.  Cardiovascular:     Rate and Rhythm: Normal rate and regular rhythm.     Heart sounds: Normal heart sounds, S1 normal and S2 normal.  Pulmonary:     Breath sounds: Normal breath sounds. No decreased breath sounds, wheezing, rhonchi or rales.  Abdominal:     Palpations: Abdomen is soft.     Tenderness: There is no abdominal tenderness.  Musculoskeletal:     Right ankle: No swelling.     Left ankle: No swelling.  Skin:    General: Skin is warm.     Findings: No rash.  Neurological:     Mental Status: She is alert and oriented to person, place, and time.       Data Reviewed: Basic Metabolic Panel: Recent Labs  Lab 04/08/20 0431 04/10/20 0818 04/11/20 0837 04/13/20 0723  NA 131* 135 136 136  K 4.6 4.4 4.5 4.4  CL 96* 102 103 102  CO2 _0 GLUCOSE 99 83  115* 95  BUN _1 CREATININE 0.46 0.63 0.57 0.52  CALCIUM 9.1 8.7* 8.9 9.1   CBC: Recent Labs  Lab 04/08/20 0431 04/10/20 0818 04/11/20 0837 04/13/20 0723  WBC 12.5* 8.3 8.7 7.8  HGB 12.9 11.8* 11.1* 11.4*  HCT 38.3 36.3 34.7* 35.1*  MCV 79.8* 82.3 83.2 82.2  PLT 338 324 PLATELET CLUMPS NOTED ON SMEAR, UNABLE TO ESTIMATE 317   Cardiac Enzymes: Recent Labs  Lab 04/13/20 0723  CKTOTAL 14*    Scheduled Meds: . buprenorphine-naloxone  1 tablet Sublingual BID  . diclofenac  1 patch Transdermal BID  . enoxaparin (LOVENOX) injection  40 mg Subcutaneous Q24H  . folic acid  1 mg Oral Daily  . multivitamin with minerals  1 tablet Oral Daily  . nicotine  21 mg Transdermal Daily  . pantoprazole  40 mg Oral Daily  . sodium chloride flush  10-40 mL Intracatheter Q12H  . thiamine  100 mg Oral Daily   Continuous Infusions: . sodium chloride 250 mL (04/08/20 1514)  . ceFEPime (MAXIPIME) IV 2 g (04/14/20 1340)  . vancomycin     Brief history.  Patient admitted on 03/31/2020 with neck pain.  Patient was started on aggressive antibiotic and pain medications.  Initial MRI of the spine showed C5-C6  discitis and osteomyelitis.  Repeat MRI of the spine on 04/10/2020 shows evolving changes of discitis osteomyelitis C5-C6 and persistent prevertebral phlegmon spanning from C2 to upper thoracic levels.  Patient is being followed by infectious disease and neurosurgery.  Assessment/Plan:  1. Osteomyelitis and discitis of C5-C6.  Patient has prevertebral phlegmon spanning from C2 down to upper thoracic levels.  Infectious disease change antibiotics back to vancomycin and continuing Maxipime.  Ideally would need 6 weeks of IV antibiotics for patient does not want to be in the hospital that long.  Infectious disease pharmacist looking into outpatient once a week dalbavancin and oral Zyvox and Cipro.  ESR elevated at 60. 2. Neck pain.  Better controlled on Suboxone.  Continue twice a day  dosing. 3. MRSA right hand mild cellulitis. 4. Leukocytosis has normalized 5. History of IVDA        Code Status:     Code Status Orders  (From admission, onward)         Start     Ordered   03/31/20 1929  Full code  Continuous        03/31/20 1928        Code Status History    Date Active Date Inactive Code Status Order ID Comments User Context   04/06/2017 0315 04/08/2017 1917 Full Code [id number]  Harrie Foreman, MD Inpatient   Advance Care Planning Activity     Disposition Plan: Status is: Inpatient  Dispo: The patient is from: Home              Anticipated d/c is to: Home              Patient currently on IV antibiotics discitis and osteomyelitis   Difficult to place patient.  No  Consultants:  Infectious disease  Neurosurgery  Antibiotics:  vancomycin  Cefepime  Time spent: 28 minutes  [place]

## 2020-04-15 DIAGNOSIS — B3731 Acute candidiasis of vulva and vagina: Secondary | ICD-10-CM

## 2020-04-15 DIAGNOSIS — B373 Candidiasis of vulva and vagina: Secondary | ICD-10-CM

## 2020-04-15 LAB — CREATININE, SERUM
Creatinine, Ser: 0.61 mg/dL (ref 0.44–1.00)
GFR, Estimated: >60 mL/min (ref >60)

## 2020-04-15 LAB — VANCOMYCIN, TROUGH: Vancomycin Tr: 15 ug/mL (ref 15–20)

## 2020-04-15 LAB — MINIMUM INHIBITORY CONC. (1 DRUG)

## 2020-04-15 LAB — VANCOMYCIN, PEAK: Vancomycin Pk: 24 ug/mL — ABNORMAL LOW (ref 30–40)

## 2020-04-15 LAB — MIC RESULT

## 2020-04-15 MED ORDER — FLUCONAZOLE 100 MG PO TABS
100.0000 mg | ORAL_TABLET | Freq: Every day | ORAL | Status: DC
  Administered 2020-04-15 – 2020-04-17 (×3): 100 mg via ORAL
  Filled 2020-04-15 (×3): qty 1

## 2020-04-15 NOTE — Progress Notes (Signed)
Pharmacy Antibiotic Note  Susan Terry is a 45 y.o. 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 Pharmacy has been consulted for vancomycin dosing. Vancomycin was stopped 3/10 for patient complaints of burning/pain in neck and shoulders, started on daptomycin 3/10   CT reveals C5-6 vertebral OM and epidural phlegmon.  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   Peak = 24   Trough = 15  TTE from 3/7 negative for vegetations  Culture from hand with MRSA   No plans currently for surgical intervention  Plan:  Continue Cefepime to 2 grams IV every 8 hours  Per ID will stop daptomycin and start vancomycin. Pt is currently on vancomycin 1000 mg q8H. Calculated AUC is 514. Goal AUC 400-550. Can potentially change dose to 1500 mg q12H (AUC of 513 and Cmin of 12). Plan to re-order levels in 4-5 days or sooner if there is a change in renal function or other clinical reasons.   Daily renal function assessment while on IV vancomycin      Height: 5\' 7"  (170.2 cm) Weight: 67.8 kg (149 lb 8 oz) IBW/kg (Calculated) : 61.6  Temp (24hrs), Avg:98.3 F (36.8 C), Min:97.8 F (36.6 C), Max:98.7 F (37.1 C)  Recent Labs  Lab 04/10/20 0818 04/11/20 0837 04/13/20 0723 04/15/20 0902 04/15/20 1359  WBC 8.3 8.7 7.8  --   --   CREATININE 0.63 0.57 0.52 0.61  --   VANCOTROUGH  --   --   --   --  15  VANCOPEAK  --   --   --  24*  --     Estimated Creatinine Clearance: 89.1 mL/min (by C-G formula based on SCr of 0.61 mg/dL).    No Known Allergies  Antimicrobials this admission: 03/05 cefepime >>  03/05 metronidazole >> 3/7 03/05 vancomycin >>   Microbiology results: 03/5  BCx: NGTD 3/7 Bcx: NGTD 3/7 Hand cx: MRSA 03/05 SARS CoV-2: negative 03/05 influenza A/B: negative    Thank you for allowing pharmacy to be a part of this patient's care.  05/05, PharmD, BCPS.   04/15/2020 2:58 PM

## 2020-04-15 NOTE — Progress Notes (Signed)
Patient ID: Analuisa Tudor, female   DOB: May 06, 1975, 45 y.o.   MRN: [id number] [facility] PROGRESS NOTE  Rhyen Mazariego GLO:[id number] DOB: [birth year] DOA: 03/31/2020 [facility]  HPI/Subjective: Patient feels okay on the Suboxone.  Still having some vaginal discharge.  States that she needs to leave the hospital by 3/26.  Objective: Vitals:   04/15/20 0857 04/15/20 1253  BP: (!) 109/58 115/61  Pulse: 79 78  Resp: 17 16  Temp: 98.4 F (36.9 C) 98 F (36.7 C)  SpO2: 98% 98%    Intake/Output Summary (Last 24 hours) at 04/15/2020 1316 Last data filed at 04/14/2020 1641 Gross per 24 hour  Intake 209.23 ml  Output --  Net 209.23 ml   Filed Weights   03/31/20 1150 04/02/20 0500  Weight: 63.5 kg 67.8 kg    ROS: Review of Systems  Respiratory: Negative for shortness of breath.   Cardiovascular: Negative for chest pain.  Gastrointestinal: Negative for abdominal pain, nausea and vomiting.   Exam: Physical Exam HENT:     Head: Normocephalic.     Mouth/Throat:     Pharynx: No oropharyngeal exudate.  Eyes:     General: Lids are normal.     Conjunctiva/sclera: Conjunctivae normal.     Pupils: Pupils are equal, round, and reactive to light.  Cardiovascular:     Rate and Rhythm: Normal rate and regular rhythm.     Heart sounds: Normal heart sounds, S1 normal and S2 normal.  Pulmonary:     Breath sounds: Normal breath sounds. No decreased breath sounds, wheezing, rhonchi or rales.  Abdominal:     Palpations: Abdomen is soft.     Tenderness: There is no abdominal tenderness.  Musculoskeletal:     Right lower leg: No swelling.     Left lower leg: No swelling.  Skin:    General: Skin is warm.     Findings: No rash.  Neurological:     Mental Status: She is alert and oriented to person, place, and time.       Data Reviewed: Basic Metabolic Panel: Recent Labs  Lab 04/10/20 0818 04/11/20 0837 04/13/20 0723 04/15/20 0902  NA 135 136 136  --   K 4.4 4.5  4.4  --   CL 102 103 102  --   CO2 26 24 28   --   GLUCOSE 83 115* 95  --   BUN 13 12 20   --   CREATININE 0.63 0.57 0.52 0.61  CALCIUM 8.7* 8.9 9.1  --    CBC: Recent Labs  Lab 04/10/20 0818 04/11/20 0837 04/13/20 0723  WBC 8.3 8.7 7.8  HGB 11.8* 11.1* 11.4*  HCT 36.3 34.7* 35.1*  MCV 82.3 83.2 82.2  PLT 324 PLATELET CLUMPS NOTED ON SMEAR, UNABLE TO ESTIMATE 317   Cardiac Enzymes: Recent Labs  Lab 04/13/20 0723  CKTOTAL 14*    Scheduled Meds: . buprenorphine-naloxone  1 tablet Sublingual BID  . diclofenac  1 patch Transdermal BID  . enoxaparin (LOVENOX) injection  40 mg Subcutaneous Q24H  . fluconazole  100 mg Oral Daily  . folic acid  1 mg Oral Daily  . multivitamin with minerals  1 tablet Oral Daily  . nicotine  21 mg Transdermal Daily  . pantoprazole  40 mg Oral Daily  . sodium chloride flush  10-40 mL Intracatheter Q12H  . thiamine  100 mg Oral Daily   Continuous Infusions: . sodium chloride 250 mL (04/08/20 1514)  . ceFEPime (MAXIPIME) IV 2 g (04/15/20 1216)  .  vancomycin 1,000 mg (04/15/20 0527)    Assessment/Plan:  1. Osteomyelitis and discitis of C5-C6.  Patient has prevertebral phlegmon spanning from C2 down to the upper thoracic levels.  Patient currently on vancomycin and cefepime.  Patient states that she would like to leave the hospital on 3/26 at the latest.  I will touch base with infectious disease and infectious disease pharmacist about this.  They are looking into outpatient once a week dalbavancin and oral Zyvox and Cipro.  Last sedimentation is 60. 2. Neck pain.  Continue Suboxone. 3. Vaginal candidiasis will daily dose Diflucan since yesterday's dose did not help. 4. MRSA cellulitis right hand.  Improved 5. History IVDA        Code Status:     Code Status Orders  (From admission, onward)         Start     Ordered   03/31/20 1929  Full code  Continuous        03/31/20 1928        Code Status History    Date Active Date  Inactive Code Status Order ID Comments User Context   04/06/2017 0315 04/08/2017 1917 Full Code [id number]  Arnaldo Natal, MD Inpatient   Advance Care Planning Activity     Disposition Plan: Status is: Inpatient  Dispo: The patient is from: Home              Anticipated d/c is to: Home              Patient currently on IV antibiotics for osteomyelitis and discitis.   Difficult to place patient.  No.  Time spent: 28 minutes  Kaisyn [employer]

## 2020-04-16 DIAGNOSIS — Z8619 Personal history of other infectious and parasitic diseases: Secondary | ICD-10-CM

## 2020-04-16 LAB — CREATININE, SERUM
Creatinine, Ser: 0.59 mg/dL (ref 0.44–1.00)
GFR, Estimated: >60 mL/min (ref >60)

## 2020-04-16 MED ORDER — CIPROFLOXACIN HCL 500 MG PO TABS
500.0000 mg | ORAL_TABLET | Freq: Two times a day (BID) | ORAL | Status: DC
  Administered 2020-04-17: 09:00:00 500 mg via ORAL
  Filled 2020-04-16: qty 1

## 2020-04-16 NOTE — Progress Notes (Signed)
Patient ID: Susan Terry, female   DOB: 07-20-1975, 45 y.o.   MRN: [id number] [facility] PROGRESS NOTE  Susan Terry FYB:[id number] DOB: 01/10/1976 DOA: 03/31/2020 [facility]  HPI/Subjective: Patient stated she is fine she just wanted to sleep.  She answered a few questions and went back to sleep.  She allowed me to do a quick listen to her heart and lungs.  Objective: Vitals:   04/16/20 0848 04/16/20 1200  BP: 102/61 114/64  Pulse: 90 81  Resp: 20 15  Temp: 98.7 F (37.1 C) 98.5 F (36.9 C)  SpO2: 98% 100%    Intake/Output Summary (Last 24 hours) at 04/16/2020 1734 Last data filed at 04/16/2020 0400 Gross per 24 hour  Intake 410 ml  Output -  Net 410 ml   Filed Weights   03/31/20 1150 04/02/20 0500  Weight: 63.5 kg 67.8 kg    ROS: Review of Systems  Unable to perform ROS: Other  Patient just said she was fine. Exam: Physical Exam Exam conducted with a chaperone present.  HENT:     Head: Normocephalic.  Eyes:     General: Lids are normal.     Conjunctiva/sclera: Conjunctivae normal.  Cardiovascular:     Rate and Rhythm: Normal rate and regular rhythm.     Heart sounds: Normal heart sounds, S1 normal and S2 normal.  Pulmonary:     Breath sounds: Normal breath sounds. No decreased breath sounds, wheezing, rhonchi or rales.  Musculoskeletal:     Right ankle: No swelling.     Left ankle: No swelling.  Skin:    Findings: No rash.  Neurological:     Comments: She moves her extremities well is in the room.       Data Reviewed: Basic Metabolic Panel: Recent Labs  Lab 04/10/20 0818 04/11/20 0837 04/13/20 0723 04/15/20 0902 04/16/20 0718  NA 135 136 136  --   --   K 4.4 4.5 4.4  --   --   CL 102 103 102  --   --   CO2 26 24 28   --   --   GLUCOSE 83 115* 95  --   --   BUN 13 12 20   --   --   CREATININE 0.63 0.57 0.52 0.61 0.59  CALCIUM 8.7* 8.9 9.1  --   --    CBC: Recent Labs  Lab 04/10/20 0818 04/11/20 0837 04/13/20 0723  WBC  8.3 8.7 7.8  HGB 11.8* 11.1* 11.4*  HCT 36.3 34.7* 35.1*  MCV 82.3 83.2 82.2  PLT 324 PLATELET CLUMPS NOTED ON SMEAR, UNABLE TO ESTIMATE 317   Cardiac Enzymes: Recent Labs  Lab 04/13/20 0723  CKTOTAL 14*    Scheduled Meds: . buprenorphine-naloxone  1 tablet Sublingual BID  . diclofenac  1 patch Transdermal BID  . enoxaparin (LOVENOX) injection  40 mg Subcutaneous Q24H  . fluconazole  100 mg Oral Daily  . folic acid  1 mg Oral Daily  . multivitamin with minerals  1 tablet Oral Daily  . nicotine  21 mg Transdermal Daily  . pantoprazole  40 mg Oral Daily  . sodium chloride flush  10-40 mL Intracatheter Q12H  . thiamine  100 mg Oral Daily   Continuous Infusions: . sodium chloride 250 mL (04/08/20 1514)  . ceFEPime (MAXIPIME) IV 2 g (04/16/20 0953)  . vancomycin 1,000 mg (04/16/20 1349)    Assessment/Plan:  1. Osteomyelitis and discitis of C5-C6.  Patient had prevertebral phlegmon spanning from C2 down to  the upper thoracic levels.  Patient currently on vancomycin and cefepime.  Patient states that she would like to leave the hospital on 3/26 at the latest.  Case discussed today with infectious disease and especially's disease pharmacist about this.  They are looking into once a week dalbavancin and oral Zyvox and Cipro upon disposition.  This will have to be set up prior to disposition. 2. Neck pain and substance abuse.  Continue Suboxone.  Spoke with transitional care team [facility]. 3. Vaginal candidiasis on daily Diflucan dosing 4. MRSA cellulitis right hand.  This has improved 5. History IVDA      Code Status:     Code Status Orders  (From admission, onward)         Start     Ordered   03/31/20 1929  Full code  Continuous        03/31/20 1928        Code Status History    Date Active Date Inactive Code Status Order ID Comments User Context   04/06/2017 0315 04/08/2017 1917 Full Code [id number]  Susan Natal, MD Inpatient    Advance Care Planning Activity     Disposition Plan: Status is: Inpatient  Dispo: The patient is from: Home              Anticipated d/c is to: Home              Patient currently requiring IV antibiotics   Difficult to place patient.  No.  Time spent: 22 minutes, case discussed with nursing staff and transitional care team  [facility]  [facility]

## 2020-04-16 NOTE — Plan of Care (Signed)
Pt received 1 dose of ativan this shift. Pain controlled with suboxone.  Problem: Education: Goal: Knowledge of General Education information will improve Description: Including pain rating scale, medication(s)/side effects and non-pharmacologic comfort measures Outcome: Progressing   Problem: Health Behavior/Discharge Planning: Goal: Ability to manage health-related needs will improve Outcome: Progressing   Problem: Clinical Measurements: Goal: Ability to maintain clinical measurements within normal limits will improve Outcome: Progressing Goal: Will remain free from infection Outcome: Progressing Goal: Diagnostic test results will improve Outcome: Progressing Goal: Respiratory complications will improve Outcome: Progressing Goal: Cardiovascular complication will be avoided Outcome: Progressing   Problem: Activity: Goal: Risk for activity intolerance will decrease Outcome: Progressing   Problem: Nutrition: Goal: Adequate nutrition will be maintained Outcome: Progressing   Problem: Coping: Goal: Level of anxiety will decrease Outcome: Progressing   Problem: Elimination: Goal: Will not experience complications related to bowel motility Outcome: Progressing Goal: Will not experience complications related to urinary retention Outcome: Progressing   Problem: Pain Managment: Goal: General experience of comfort will improve Outcome: Progressing   Problem: Safety: Goal: Ability to remain free from injury will improve Outcome: Progressing   Problem: Skin Integrity: Goal: Risk for impaired skin integrity will decrease Outcome: Progressing

## 2020-04-16 NOTE — Progress Notes (Signed)
ID Pt is keen to go home Whenever I enter her room that is what she asks- She wants to go back to her new job Says no neck pain Has not been needing pain meds according to her Tolerating vancomycin with no issues Patient Vitals for the past 24 hrs:  BP Temp Temp src Pulse Resp SpO2  04/16/20 1200 114/64 98.5 F (36.9 C) Oral 81 15 100 %  04/16/20 0848 102/61 98.7 F (37.1 C) Oral 90 20 98 %  04/16/20 0401 117/71 98.5 F (36.9 C) -- 83 16 97 %  04/16/20 0030 119/70 98.6 F (37 C) -- 87 16 99 %  04/15/20 1951 117/69 99 F (37.2 C) Oral 90 20 100 %   Alert and in no distress Chest b/l air entry [id number] CNS non focal  Labs CBC Latest Ref Rng & Units 04/13/2020 04/11/2020 04/10/2020  WBC 4.0 - 10.5 K/uL 7.8 8.7 8.3  Hemoglobin 12.0 - 15.0 g/dL 11.4(L) 11.1(L) 11.8(L)  Hematocrit 36.0 - 46.0 % 35.1(L) 34.7(L) 36.3  Platelets 150 - 400 K/uL 317 PLATELET CLUMPS NOTED ON SMEAR, UNABLE TO ESTIMATE 324    CMP Latest Ref Rng & Units 04/16/2020 04/15/2020 04/13/2020  Glucose 70 - 99 mg/dL - - 95  BUN 6 - 20 mg/dL - - 20  Creatinine 1.61 - 1.00 mg/dL 0.96 0.45 4.09  Sodium 135 - 145 mmol/L - - 136  Potassium 3.5 - 5.1 mmol/L - - 4.4  Chloride 98 - 111 mmol/L - - 102  CO2 22 - 32 mmol/L - - 28  Calcium 8.9 - 10.3 mg/dL - - 9.1  Total Protein 6.5 - 8.1 g/dL - - -  Total Bilirubin 0.3 - 1.2 mg/dL - - -  Alkaline Phos 38 - 126 U/L - - -  AST 15 - 41 U/L - - -  ALT 0 - 44 U/L - - -    Micro 03/31/20 BC- NG 04/02/20 wound culture MRSA  Impression/recommendation 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  Seen by neurosurgery who did not think she needed any intervention.  Being treated empirically for MRSA and gram-negative with IV vancomycin ( was on daptomycin for a week)  and IV cefepime.  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  Also one of the needle sites had a wound and the culture  was positive for MRSA on 04/02/2020.  Blood cultures have remained negative.  Ideally patient needs 6 weeks of IV antibiotics.  Cannot be discharged on PICC line. Patient wants to leave the hospital soon as possible as she is now pain-free.  She has received 17 days of IV antibiotics. Patient is requesting that she be discharged soon as possible. We will arrange for her to have weekly dalbavancin starting Wednesday, 04/18/2020 Will need to get p.o. linezolid 600 mg twice daily for 3 weeks and ciprofloxacin 500 mg p.o. twice daily for 3 weeks.  History of lumbar discitis and osteomyelitis  History of MRSA infection  History of Serratia infection  Discussed the management with the patient, pharmacist and hospitalist.

## 2020-04-17 ENCOUNTER — Other Ambulatory Visit: Payer: Self-pay | Admitting: Internal Medicine

## 2020-04-17 LAB — CBC
HCT: 35.4 % — ABNORMAL LOW (ref 36.0–46.0)
Hemoglobin: 11.1 g/dL — ABNORMAL LOW (ref 12.0–15.0)
MCH: 26.6 pg (ref 26.0–34.0)
MCHC: 31.4 g/dL (ref 30.0–36.0)
MCV: 84.7 fL (ref 80.0–100.0)
Platelets: 307 10*3/uL (ref 150–400)
RBC: 4.18 MIL/uL (ref 3.87–5.11)
RDW: 14.4 % (ref 11.5–15.5)
WBC: 5.4 10*3/uL (ref 4.0–10.5)
nRBC: 0 % (ref 0.0–0.2)

## 2020-04-17 LAB — AEROBIC CULTURE W GRAM STAIN (SUPERFICIAL SPECIMEN)

## 2020-04-17 LAB — BASIC METABOLIC PANEL
Anion gap: 8 (ref 5–15)
BUN: 21 mg/dL — ABNORMAL HIGH (ref 6–20)
CO2: 23 mmol/L (ref 22–32)
Calcium: 9.1 mg/dL (ref 8.9–10.3)
Chloride: 103 mmol/L (ref 98–111)
Creatinine, Ser: 0.48 mg/dL (ref 0.44–1.00)
GFR, Estimated: >60 mL/min (ref >60)
Glucose, Bld: 141 mg/dL — ABNORMAL HIGH (ref 70–99)
Potassium: 3.5 mmol/L (ref 3.5–5.1)
Sodium: 134 mmol/L — ABNORMAL LOW (ref 135–145)

## 2020-04-17 LAB — C-REACTIVE PROTEIN: CRP: 0.7 mg/dL (ref <1.0)

## 2020-04-17 LAB — SEDIMENTATION RATE: Sed Rate: 74 mm/hr — ABNORMAL HIGH (ref 0–20)

## 2020-04-17 MED ORDER — FOLIC ACID 1 MG PO TABS: 1.0000 mg | ORAL_TABLET | Freq: Every day | ORAL | 0 refills | Status: DC

## 2020-04-17 MED ORDER — FLUCONAZOLE 100 MG PO TABS: 100.0000 mg | ORAL_TABLET | Freq: Every day | ORAL | 0 refills | Status: AC

## 2020-04-17 MED ORDER — CIPROFLOXACIN HCL 500 MG PO TABS: 500.0000 mg | ORAL_TABLET | Freq: Two times a day (BID) | ORAL | 0 refills | Status: AC

## 2020-04-17 MED ORDER — DEXTROSE 5 % IV SOLN
1000.0000 mg | Freq: Once | INTRAVENOUS | Status: AC
  Administered 2020-04-17: 1000 mg via INTRAVENOUS
  Filled 2020-04-17: qty 50

## 2020-04-17 MED ORDER — THIAMINE HCL 100 MG PO TABS: 100.0000 mg | ORAL_TABLET | Freq: Every day | ORAL | 0 refills | Status: DC

## 2020-04-17 MED ORDER — BUPRENORPHINE HCL-NALOXONE HCL 8-2 MG SL SUBL
1.0000 | SUBLINGUAL_TABLET | Freq: Two times a day (BID) | SUBLINGUAL | 0 refills | Status: DC
Start: 2020-04-17 — End: 2021-09-12

## 2020-04-17 NOTE — Progress Notes (Signed)
Primary RN will d/c midline, pt does not have a PICC line. Bedside RN verbalized understanding.

## 2020-04-17 NOTE — Plan of Care (Signed)

## 2020-04-17 NOTE — Progress Notes (Signed)
Pharmacy - Antimicrobial Stewardship  Antibiotic plan per ID: Ciprofloxacin 500mg  PO BID x 3 weeks ($0 from [facility] medication mgmt clinic) Linezolid 600mg  PO BID x 2-3 weeks (ID to decide with f/u appts) (3 week supply = $0 from [facility] patient assistance.  Medication being mailed to her home address which was confirmed with patient) Dalbavancin 1gm IV x 1 today then plan is 500mg  q7d (patient assistance approval pending)  Appointment for dalbavancin infusion made for [facility] same day surgery with 1st appointment 3/29 at 10am.  Patient aware of appointment  Patient given handouts for ciprofloxacin and linezolid.  Instructed on how to take PO antibiotics and common side effects.   , PharmD, BCPS.   Work Cell: [phone] 04/17/2020 12:58 PM

## 2020-04-17 NOTE — TOC Transition Note (Signed)
Transition of Care [facility]) - CM/SW Discharge Note   Patient Details  Name: Susan Terry MRN: [id number] Date of Birth: [birth year]  [facility] CM/SW Contact:  Allayne Butcher, RN Phone Number: 04/17/2020, 3:14 PM   Clinical Narrative:    Patient discharging home today.  ID and pharmacy have arranged for Linazolid to be delivered to the patient's home, she will come in once weekly for IV antibiotic infusion.  Other medications picked up from Medication Management and delivered to the patient in the room.   Final next level of care: Home/Self Care Barriers to Discharge: Barriers Resolved   Patient Goals and CMS Choice Patient states their goals for this hospitalization and ongoing recovery are:: to return home CMS Medicare.gov Compare Post Acute Care list provided to:: Patient Choice offered to / list presented to : Patient  Discharge Placement                       Discharge Plan and Services                                     Social Determinants of Health (SDOH) Interventions     Readmission Risk Interventions No flowsheet data found.

## 2020-04-17 NOTE — Discharge Summary (Signed)
[facility] [place] [facility]   PATIENT NAME: Susan Terry    MR#:  [id number]  DATE OF BIRTH:  10/13/1975  DATE OF ADMISSION:  03/31/2020 ADMITTING PHYSICIAN: Gertha Calkin, MD  DATE OF DISCHARGE: 04/17/2020  2:24 PM  PRIMARY CARE PHYSICIAN: [facility]    ADMISSION DIAGNOSIS:  Discitis of cervical region [M46.42] Osteomyelitis of cervical spine (HCC) [M46.22]  DISCHARGE DIAGNOSIS:  Principal Problem:   Osteomyelitis of vertebra of cervical region [facility]) Active Problems:   MRSA cellulitis   Cervical discitis   Tobacco use disorder   IVDU (intravenous drug user)   Heroin abuse (HCC)   Neck pain   Cellulitis of hand   Leukocytosis   Vagina, candidiasis   SECONDARY DIAGNOSIS:   Past Medical History:  Diagnosis Date  . Drug abuse (HCC)   . Osteomyelitis (HCC)     HOSPITAL COURSE:   1.  Osteomyelitis and discitis of C5 and C6.  Prevertebral phlegmon spanning from C2 down to the upper thoracic levels.  The patient was given IV antibiotics during the hospital course.  She was on vancomycin and cefepime prior to discharge.  The patient was admitted on 03/31/2020.  The patient did not want to stay in the hospital for 6 weeks of IV antibiotics.  Infectious disease made up by another plan of weekly dalbavancin and oral Zyvox and Cipro upon disposition.  Once the oral Zyvox was mailed to her house she stated she wanted to go home.  We did give a dose of dalbavancin prior to discharge.  She will follow up weekly in the infusion center for dalbavancin infusion.  Zyvox was prescribed through the drug company to complete a course.  3 weeks of oral Cipro prescribed through medication management.  Follow-up with infectious disease as outpatient. Last MRI of the cervical spine on 04/10/2020 showed evolving changes of discitis osteomyelitis of C5-C6.  Persistent prevertebral phlegmon spanning from C2 to upper thoracic levels.  Ventral epidural phlegmon spanning from  C4-C5 to C7-T1.  Patient was seen by neurosurgery and since patient was clinically improving no neurosurgical intervention recommended. Upon discharge white blood cell count 5.4.  Sedimentation rate 74.,  CRP 0.7.  And creatinine 0.48. 2.  Neck pain and prior substance abuse.  The patient was converted from IV Dilaudid over to Suboxone.  One of my associates was able to write a Suboxone prescription for the patient.  She will follow up with [facility] for the Suboxone clinic. 3.  Vaginal candidiasis I did dose Diflucan daily. 4.  MRSA cellulitis of the right hand.  This has improved 5.  History of IVDA  DISCHARGE CONDITIONS:   Satisfactory  CONSULTS OBTAINED:  Treatment Team:  Lynn Ito, MD  DRUG ALLERGIES:  No Known Allergies  DISCHARGE MEDICATIONS:   Allergies as of 04/17/2020   No Known Allergies     Medication List    STOP taking these medications   azithromycin 250 MG tablet Commonly known as: Zithromax Z-Pak   elvitegravir-cobicistat-emtricitabine-tenofovir 150-150-200-10 MG Tabs tablet Commonly known as: GENVOYA   predniSONE 10 MG (21) Tbpk tablet Commonly known as: STERAPRED UNI-PAK 21 TAB     TAKE these medications   buprenorphine-naloxone 8-2 mg Subl SL tablet Commonly known as: SUBOXONE Place 1 tablet under the tongue 2 (two) times daily.   ciprofloxacin 500 MG tablet Commonly known as: CIPRO Take 1 tablet (500 mg total) by mouth 2 (two) times daily for 21 days.   fluconazole 100  MG tablet Commonly known as: DIFLUCAN Take 1 tablet (100 mg total) by mouth daily for 7 days. Start taking on: April 18, 2020   folic acid 1 MG tablet Commonly known as: FOLVITE Take 1 tablet (1 mg total) by mouth daily. Start taking on: April 18, 2020   thiamine 100 MG tablet Take 1 tablet (100 mg total) by mouth daily. Start taking on: April 18, 2020      Zyvox 600 mg twice a day to completion was mailed to her house.  DISCHARGE  INSTRUCTIONS:   Follow-up PMD Follow-up Dr. Joylene Draft 10 days Follow-up [facility]  If you experience worsening of your admission symptoms, develop shortness of breath, life threatening emergency, suicidal or homicidal thoughts you must seek medical attention immediately by calling 911 or calling your MD immediately  if symptoms less severe.  You Must read complete instructions/literature along with all the possible adverse reactions/side effects for all the Medicines you take and that have been prescribed to you. Take any new Medicines after you have completely understood and accept all the possible adverse reactions/side effects.   Please note  You were cared for by a hospitalist during your hospital stay. If you have any questions about your discharge medications or the care you received while you were in the hospital after you are discharged, you can call the unit and asked to speak with the hospitalist on call if the hospitalist that took care of you is not available. Once you are discharged, your primary care physician will handle any further medical issues. Please note that NO REFILLS for any discharge medications will be authorized once you are discharged, as it is imperative that you return to your primary care physician (or establish a relationship with a primary care physician if you do not have one) for your aftercare needs so that they can reassess your need for medications and monitor your lab values.    Today   CHIEF COMPLAINT:   Chief Complaint  Patient presents with  . Osteomyelitis   . Neck Pain    HISTORY OF PRESENT ILLNESS:  Susan Terry  is a 45 y.o. female came in with neck pain and was diagnosed with osteomyelitis   VITAL SIGNS:  Blood pressure (!) 104/59, pulse 83, temperature 98.5 F (36.9 C), resp. rate 18, height  (1.702 m), weight 67.8 kg, SpO2 98 %.   PHYSICAL EXAMINATION:  GENERAL:  45 y.o.-year-old patient lying in the bed with  no acute distress.  EYES:  No scleral icterus. HEENT: Head atraumatic, normocephalic. Oropharynx and nasopharynx clear.  NECK: Good range of motion neck.  LUNGS: Normal breath sounds bilaterally, no wheezing, rales,rhonchi or crepitation. No use of accessory muscles of respiration.  CARDIOVASCULAR: S1, S2 normal. No murmurs, rubs, or gallops.  ABDOMEN: Soft, non-tender, non-distended.  EXTREMITIES: No pedal edema.  NEUROLOGIC: Cranial nerves II through XII are intact. Muscle strength 5/5 in all extremities. Sensation intact. Gait not checked.  PSYCHIATRIC: The patient is alert and oriented x 3.  SKIN: No obvious rash, lesion, or ulcer.   DATA REVIEW:   CBC Recent Labs  Lab 04/17/20 0906  WBC 5.4  HGB 11.1*  HCT 35.4*  PLT 307    Chemistries  Recent Labs  Lab 04/17/20 0906  NA 134*  K 3.5  CL 103  CO2 23  GLUCOSE 141*  BUN 21*  CREATININE 0.48  CALCIUM 9.1     Microbiology Results  Results for orders placed or performed during the  hospital encounter of 03/31/20  Resp Panel by RT-PCR (Flu A&B, Covid) Nasopharyngeal Swab     Status: None   Collection Time: 03/31/20  8:01 PM   Specimen: Nasopharyngeal Swab; Nasopharyngeal(NP) swabs in vial transport medium  Result Value Ref Range Status   SARS Coronavirus 2 by RT PCR NEGATIVE NEGATIVE Final    Comment: (NOTE) SARS-CoV-2 target nucleic acids are NOT DETECTED.  The SARS-CoV-2 RNA is generally detectable in upper respiratory specimens during the acute phase of infection. The lowest concentration of SARS-CoV-2 viral copies this assay can detect is 138 copies/mL. A negative result does not preclude SARS-Cov-2 infection and should not be used as the sole basis for treatment or other patient management decisions. A negative result may occur with  improper specimen collection/handling, submission of specimen other than nasopharyngeal swab, presence of viral mutation(s) within the areas targeted by this assay, and  inadequate number of viral copies(<138 copies/mL). A negative result must be combined with clinical observations, patient history, and epidemiological information. The expected result is Negative.  Fact Sheet for Patients:  BloggerCourse.com  Fact Sheet for Healthcare Providers:  SeriousBroker.it  This test is no t yet approved or cleared by the [place] FDA and  has been authorized for detection and/or diagnosis of SARS-CoV-2 by FDA under an Emergency Use Authorization (EUA). This EUA will remain  in effect (meaning this test can be used) for the duration of the COVID-19 declaration under Section 564(b)(1) of the Act, 21 U.S.C.section 360bbb-3(b)(1), unless the authorization is terminated  or revoked sooner.       Influenza A by PCR NEGATIVE NEGATIVE Final   Influenza B by PCR NEGATIVE NEGATIVE Final    Comment: (NOTE) The Xpert Xpress SARS-CoV-2/FLU/RSV plus assay is intended as an aid in the diagnosis of influenza from Nasopharyngeal swab specimens and should not be used as a sole basis for treatment. Nasal washings and aspirates are unacceptable for Xpert Xpress SARS-CoV-2/FLU/RSV testing.  Fact Sheet for Patients: BloggerCourse.com  Fact Sheet for Healthcare Providers: SeriousBroker.it  This test is not yet approved or cleared by the [place] FDA and has been authorized for detection and/or diagnosis of SARS-CoV-2 by FDA under an Emergency Use Authorization (EUA). This EUA will remain in effect (meaning this test can be used) for the duration of the COVID-19 declaration under Section 564(b)(1) of the Act, 21 U.S.C. section 360bbb-3(b)(1), unless the authorization is terminated or revoked.  Performed at [facility], [address] [place], [place] [address]   Culture, blood (Routine X 2) w Reflex to ID Panel     Status: None   Collection Time:  03/31/20 11:50 PM   Specimen: BLOOD  Result Value Ref Range Status   Specimen Description BLOOD BLOOD LEFT WRIST  Final   Special Requests   Final    BOTTLES DRAWN AEROBIC AND ANAEROBIC Blood Culture adequate volume   Culture   Final    NO GROWTH 5 DAYS Performed at [facility], [address] [place], [place] [address]    Report Status 04/05/2020 FINAL  Final  MRSA PCR Screening     Status: None   Collection Time: 04/02/20 11:40 AM   Specimen: Nasal Mucosa; Nasopharyngeal  Result Value Ref Range Status   MRSA by PCR NEGATIVE NEGATIVE Final    Comment:        The GeneXpert MRSA Assay (FDA approved for NASAL specimens only), is one component of a comprehensive MRSA colonization surveillance program. It is not intended to  diagnose MRSA infection nor to guide or monitor treatment for MRSA infections. Performed at [facility], [address] [place], [place] [address]   Aerobic Culture w Gram Stain (superficial specimen)     Status: None   Collection Time: 04/02/20  7:23 PM   Specimen: Hand; Wound  Result Value Ref Range Status   Specimen Description   Final    HAND Performed at [facility], [address] [place], [place] [address]    Special Requests   Final    NONE Performed at [facility], [address] [place], [place] [address]    Gram Stain   Final    MODERATE WBC PRESENT, PREDOMINANTLY PMN RARE GRAM POSITIVE COCCI    Culture   Final    MODERATE METHICILLIN RESISTANT STAPHYLOCOCCUS AUREUS SEE SEPARATE REPORT Performed at [facility], [address] [place], [place] [address]    Report Status 04/17/2020 FINAL  Final   Organism ID, Bacteria METHICILLIN RESISTANT STAPHYLOCOCCUS AUREUS  Final      Susceptibility   Methicillin resistant staphylococcus aureus - MIC*    CIPROFLOXACIN >=8 RESISTANT Resistant     ERYTHROMYCIN >=8 RESISTANT Resistant     GENTAMICIN >=16 RESISTANT Resistant     OXACILLIN >=4  RESISTANT Resistant     TETRACYCLINE >=16 RESISTANT Resistant     VANCOMYCIN 1 SENSITIVE Sensitive     TRIMETH/SULFA >=320 RESISTANT Resistant     CLINDAMYCIN >=8 RESISTANT Resistant     RIFAMPIN <=0.5 SENSITIVE Sensitive     Inducible Clindamycin NEGATIVE Sensitive     LINEZOLID Value in next row Sensitive      SENSITIVE2    QUINUPRISTIN/DALFOPRISTIN Value in next row Sensitive      SENSITIVE0.25    * MODERATE METHICILLIN RESISTANT STAPHYLOCOCCUS AUREUS  Culture, blood (Routine X 2) w Reflex to ID Panel     Status: None   Collection Time: 04/02/20 11:06 PM   Specimen: BLOOD  Result Value Ref Range Status   Specimen Description BLOOD RIGHT HAND  Final   Special Requests   Final    BOTTLES DRAWN AEROBIC AND ANAEROBIC Blood Culture adequate volume   Culture   Final    NO GROWTH 5 DAYS Performed at [facility], [address] [place], [place] [address]    Report Status 04/07/2020 FINAL  Final  Culture, blood (Routine X 2) w Reflex to ID Panel     Status: None   Collection Time: 04/02/20 11:06 PM   Specimen: BLOOD  Result Value Ref Range Status   Specimen Description BLOOD LEFT [facility]  Final   Special Requests   Final    BOTTLES DRAWN AEROBIC AND ANAEROBIC Blood Culture adequate volume   Culture   Final    NO GROWTH 5 DAYS Performed at [facility], [address] [place], [place] [address]    Report Status 04/07/2020 FINAL  Final     Management plans discussed with the patient, and she told me that she would like to go home today.  CODE STATUS:     Code Status Orders  (From admission, onward)         Start     Ordered   03/31/20 1929  Full code  Continuous        03/31/20 1928        Code Status History    Date Active Date Inactive Code Status Order ID Comments User Context   04/06/2017 0315 04/08/2017 1917  Full Code [id number]  Arnaldo Natal, MD Inpatient   Advance Care Planning Activity      TOTAL TIME TAKING CARE OF THIS PATIENT: 35  minutes.    Alford Highland M.D on 04/17/2020 at 6:14 PM  Between 7am to 6pm - Pager - [phone]  After 6pm go to [email or web address] - password [email or web address]  [facility]  CC: Primary care physician; [facility]

## 2020-04-17 NOTE — Progress Notes (Signed)
Chart reviewed.  Patient with opiate use disorder, relapsed, now admitted with disseminated bacterial infection.  Stabilized here on buprenorphine 16 mg daily.  Discussed with Dr. Renae Gloss.  Patient has outpatient follow up arranged.  This prescription is for maintenance of addiction, not acute pain.

## 2020-04-24 ENCOUNTER — Ambulatory Visit
Admit: 2020-04-24 | Discharge: 2020-04-24 | Disposition: A | Discharge status: Home / Self Care | Payer: Self-pay | Attending: Infectious Diseases | Admitting: Infectious Diseases

## 2020-04-24 ENCOUNTER — Telehealth: Payer: Self-pay

## 2020-04-24 ENCOUNTER — Other Ambulatory Visit: Payer: Self-pay

## 2020-04-24 ENCOUNTER — Other Ambulatory Visit: Payer: Self-pay | Admitting: Infectious Diseases

## 2020-04-24 DIAGNOSIS — M4620 Osteomyelitis of vertebra, site unspecified: Secondary | ICD-10-CM

## 2020-04-24 LAB — CBC WITH DIFFERENTIAL/PLATELET
Abs Immature Granulocytes: 0.01 10*3/uL (ref 0.00–0.07)
Basophils Absolute: 0 10*3/uL (ref 0.0–0.1)
Basophils Relative: 1 %
Eosinophils Absolute: 0.2 10*3/uL (ref 0.0–0.5)
Eosinophils Relative: 4 %
HCT: 34.5 % — ABNORMAL LOW (ref 36.0–46.0)
Hemoglobin: 11.1 g/dL — ABNORMAL LOW (ref 12.0–15.0)
Immature Granulocytes: 0 %
Lymphocytes Relative: 55 %
Lymphs Abs: 2.5 10*3/uL (ref 0.7–4.0)
MCH: 26.9 pg (ref 26.0–34.0)
MCHC: 32.2 g/dL (ref 30.0–36.0)
MCV: 83.7 fL (ref 80.0–100.0)
Monocytes Absolute: 0.3 10*3/uL (ref 0.1–1.0)
Monocytes Relative: 6 %
Neutro Abs: 1.5 10*3/uL — ABNORMAL LOW (ref 1.7–7.7)
Neutrophils Relative %: 34 %
Platelets: 172 10*3/uL (ref 150–400)
RBC: 4.12 MIL/uL (ref 3.87–5.11)
RDW: 14.6 % (ref 11.5–15.5)
WBC: 4.5 10*3/uL (ref 4.0–10.5)
nRBC: 0 % (ref 0.0–0.2)

## 2020-04-24 LAB — COMPREHENSIVE METABOLIC PANEL
ALT: 19 U/L (ref 0–44)
AST: 24 U/L (ref 15–41)
Albumin: 3.1 g/dL — ABNORMAL LOW (ref 3.5–5.0)
Alkaline Phosphatase: 86 U/L (ref 38–126)
Anion gap: 9 (ref 5–15)
BUN: 6 mg/dL (ref 6–20)
CO2: 27 mmol/L (ref 22–32)
Calcium: 9.1 mg/dL (ref 8.9–10.3)
Chloride: 100 mmol/L (ref 98–111)
Creatinine, Ser: 0.58 mg/dL (ref 0.44–1.00)
GFR, Estimated: >60 mL/min (ref >60)
Glucose, Bld: 106 mg/dL — ABNORMAL HIGH (ref 70–99)
Potassium: 3.7 mmol/L (ref 3.5–5.1)
Sodium: 136 mmol/L (ref 135–145)
Total Bilirubin: 0.4 mg/dL (ref 0.3–1.2)
Total Protein: 7.5 g/dL (ref 6.5–8.1)

## 2020-04-24 LAB — SEDIMENTATION RATE: Sed Rate: 58 mm/hr — ABNORMAL HIGH (ref 0–20)

## 2020-04-24 LAB — C-REACTIVE PROTEIN: CRP: 0.9 mg/dL (ref <1.0)

## 2020-04-24 MED ORDER — DEXTROSE 5 % IV SOLN
500.0000 mg | INTRAVENOUS | Status: DC
  Administered 2020-04-24: 500 mg via INTRAVENOUS
  Filled 2020-04-24: qty 25

## 2020-04-24 NOTE — Progress Notes (Signed)
Pt c/o increased pain and stomach upset from antibiotics at home. Requesting that she be admitted again. Dr. Rivka Safer made aware, orders placed for blood work prior to IV antibiotic and states to inform pt to go to ED or PCP for evaluation of pain. VSS, denies fever at home.

## 2020-04-24 NOTE — Telephone Encounter (Signed)
[place] with Short Stay called stating patient was there for IV infusion and patient is complaining of stomach pain. Patient denies any diarrhea. Per [place] all of patient's vitals were within normal range. Patient requesting Dr. Rivka Safer to direct admit her.  Per Dr. Rivka Safer she can not direct admit the patient and patient will need to go to ED. [place] stated the will let the patient know to go to the ED. Dr. Rivka Safer also requested a CBC and CMP be drawn on the patient while she is at short stay and she will put the orders in.  Allean Montfort T Pricilla Loveless

## 2020-04-25 NOTE — Telephone Encounter (Signed)
Labs from yesterday did not show anything abnormal. Her cbc on the whole was lower but still within limits. As she is on linezolid will check labs again in a week. May have to DC linezolid if the trend continues

## 2020-04-26 ENCOUNTER — Ambulatory Visit: Payer: Medicaid Other | Admitting: Infectious Diseases

## 2020-04-27 ENCOUNTER — Other Ambulatory Visit: Payer: Self-pay

## 2020-04-27 DIAGNOSIS — M4622 Osteomyelitis of vertebra, cervical region: Secondary | ICD-10-CM

## 2020-04-27 NOTE — Addendum Note (Signed)
Addended by: Tressa Busman T on: 04/27/2020 12:19 PM   Modules accepted: Orders

## 2020-04-27 NOTE — Telephone Encounter (Signed)
Patient missed appointment with you yesterday. Would you like the infusion center to do labs on her when she sees them on 05/01/20? Please advise.

## 2020-04-27 NOTE — Telephone Encounter (Signed)
Yes please. Thx

## 2020-05-01 ENCOUNTER — Ambulatory Visit
Admission: RE | Admit: 2020-05-01 | Discharge: 2020-05-01 | Disposition: A | Discharge status: Home / Self Care | Payer: Medicaid Other | Source: Ambulatory Visit | Attending: Infectious Diseases | Admitting: Infectious Diseases

## 2020-05-01 ENCOUNTER — Telehealth: Payer: Self-pay | Admitting: Infectious Diseases

## 2020-05-01 ENCOUNTER — Ambulatory Visit
Admit: 2020-05-01 | Discharge: 2020-05-01 | Disposition: A | Discharge status: Home / Self Care | Payer: Medicaid Other | Attending: Infectious Diseases | Admitting: Infectious Diseases

## 2020-05-01 ENCOUNTER — Other Ambulatory Visit: Payer: Self-pay

## 2020-05-01 DIAGNOSIS — L0390 Cellulitis, unspecified: Secondary | ICD-10-CM | POA: Insufficient documentation

## 2020-05-01 LAB — CBC WITH DIFFERENTIAL/PLATELET
Abs Immature Granulocytes: 0.01 10*3/uL (ref 0.00–0.07)
Basophils Absolute: 0 10*3/uL (ref 0.0–0.1)
Basophils Relative: 1 %
Eosinophils Absolute: 0.2 10*3/uL (ref 0.0–0.5)
Eosinophils Relative: 4 %
HCT: 33.7 % — ABNORMAL LOW (ref 36.0–46.0)
Hemoglobin: 11 g/dL — ABNORMAL LOW (ref 12.0–15.0)
Immature Granulocytes: 0 %
Lymphocytes Relative: 38 %
Lymphs Abs: 1.7 10*3/uL (ref 0.7–4.0)
MCH: 27 pg (ref 26.0–34.0)
MCHC: 32.6 g/dL (ref 30.0–36.0)
MCV: 82.6 fL (ref 80.0–100.0)
Monocytes Absolute: 0.4 10*3/uL (ref 0.1–1.0)
Monocytes Relative: 9 %
Neutro Abs: 2.2 10*3/uL (ref 1.7–7.7)
Neutrophils Relative %: 48 %
Platelets: 161 10*3/uL (ref 150–400)
RBC: 4.08 MIL/uL (ref 3.87–5.11)
RDW: 14.8 % (ref 11.5–15.5)
WBC: 4.6 10*3/uL (ref 4.0–10.5)
nRBC: 0 % (ref 0.0–0.2)

## 2020-05-01 LAB — BASIC METABOLIC PANEL
Anion gap: 8 (ref 5–15)
BUN: 10 mg/dL (ref 6–20)
CO2: 26 mmol/L (ref 22–32)
Calcium: 9.2 mg/dL (ref 8.9–10.3)
Chloride: 102 mmol/L (ref 98–111)
Creatinine, Ser: 0.55 mg/dL (ref 0.44–1.00)
GFR, Estimated: >60 mL/min (ref >60)
Glucose, Bld: 152 mg/dL — ABNORMAL HIGH (ref 70–99)
Potassium: 3.7 mmol/L (ref 3.5–5.1)
Sodium: 136 mmol/L (ref 135–145)

## 2020-05-01 MED ORDER — DEXTROSE 5 % IV SOLN
500.0000 mg | Freq: Once | INTRAVENOUS | Status: AC
  Administered 2020-05-01: 500 mg via INTRAVENOUS
  Filled 2020-05-01: qty 25

## 2020-05-01 NOTE — Telephone Encounter (Signed)
Pt called and having nausea with oral antibiotics. She is on cipro and linezolid and weekly IV dalbavancin.

## 2020-05-03 NOTE — Telephone Encounter (Signed)
Will try zofran before meds. Check cbc. Can consider stopping zyvox first if needed

## 2020-05-08 ENCOUNTER — Other Ambulatory Visit: Payer: Self-pay

## 2020-05-08 ENCOUNTER — Ambulatory Visit: Payer: Medicaid Other

## 2020-05-08 ENCOUNTER — Ambulatory Visit
Admission: RE | Admit: 2020-05-08 | Discharge: 2020-05-08 | Disposition: A | Discharge status: Home / Self Care | Payer: Medicaid Other | Source: Ambulatory Visit | Attending: Infectious Diseases | Admitting: Infectious Diseases

## 2020-05-08 DIAGNOSIS — L0390 Cellulitis, unspecified: Secondary | ICD-10-CM | POA: Insufficient documentation

## 2020-05-08 LAB — CBC WITH DIFFERENTIAL/PLATELET
Abs Immature Granulocytes: 0.01 10*3/uL (ref 0.00–0.07)
Basophils Absolute: 0 10*3/uL (ref 0.0–0.1)
Basophils Relative: 1 %
Eosinophils Absolute: 0.1 10*3/uL (ref 0.0–0.5)
Eosinophils Relative: 1 %
HCT: 42 % (ref 36.0–46.0)
Hemoglobin: 13.8 g/dL (ref 12.0–15.0)
Immature Granulocytes: 0 %
Lymphocytes Relative: 39 %
Lymphs Abs: 2.3 10*3/uL (ref 0.7–4.0)
MCH: 27.2 pg (ref 26.0–34.0)
MCHC: 32.9 g/dL (ref 30.0–36.0)
MCV: 82.8 fL (ref 80.0–100.0)
Monocytes Absolute: 0.4 10*3/uL (ref 0.1–1.0)
Monocytes Relative: 6 %
Neutro Abs: 3.2 10*3/uL (ref 1.7–7.7)
Neutrophils Relative %: 53 %
Platelets: 210 10*3/uL (ref 150–400)
RBC: 5.07 MIL/uL (ref 3.87–5.11)
RDW: 14.8 % (ref 11.5–15.5)
WBC: 6.1 10*3/uL (ref 4.0–10.5)
nRBC: 0 % (ref 0.0–0.2)

## 2020-05-08 LAB — COMPREHENSIVE METABOLIC PANEL
ALT: 18 U/L (ref 0–44)
AST: 27 U/L (ref 15–41)
Albumin: 4.1 g/dL (ref 3.5–5.0)
Alkaline Phosphatase: 90 U/L (ref 38–126)
Anion gap: 12 (ref 5–15)
BUN: 13 mg/dL (ref 6–20)
CO2: 24 mmol/L (ref 22–32)
Calcium: 9.9 mg/dL (ref 8.9–10.3)
Chloride: 98 mmol/L (ref 98–111)
Creatinine, Ser: 0.7 mg/dL (ref 0.44–1.00)
GFR, Estimated: >60 mL/min (ref >60)
Glucose, Bld: 101 mg/dL — ABNORMAL HIGH (ref 70–99)
Potassium: 3.9 mmol/L (ref 3.5–5.1)
Sodium: 134 mmol/L — ABNORMAL LOW (ref 135–145)
Total Bilirubin: 0.8 mg/dL (ref 0.3–1.2)
Total Protein: 8.8 g/dL — ABNORMAL HIGH (ref 6.5–8.1)

## 2020-05-08 MED ORDER — DEXTROSE 5 % IV SOLN
500.0000 mg | Freq: Once | INTRAVENOUS | Status: DC
  Filled 2020-05-08: qty 25

## 2020-05-08 NOTE — Progress Notes (Signed)
After numerous unsuccessful attempts to start PIV patient asked if she could come back tomorrow for IV antibiotic.  She will go home and drink plenty of fluids-states she had worked and did not drink all while there.

## 2020-05-09 ENCOUNTER — Ambulatory Visit
Admission: RE | Admit: 2020-05-09 | Discharge: 2020-05-09 | Disposition: A | Discharge status: Home / Self Care | Payer: Medicaid Other | Source: Ambulatory Visit | Attending: Infectious Diseases | Admitting: Infectious Diseases

## 2020-05-09 DIAGNOSIS — L0390 Cellulitis, unspecified: Secondary | ICD-10-CM | POA: Insufficient documentation

## 2020-05-09 MED ORDER — DEXTROSE 5 % IV SOLN
500.0000 mg | Freq: Once | INTRAVENOUS | Status: AC
  Administered 2020-05-09: 500 mg via INTRAVENOUS
  Filled 2020-05-09: qty 25

## 2020-05-09 MED ORDER — CEFAZOLIN SODIUM-DEXTROSE 2-4 GM/100ML-% IV SOLN
INTRAVENOUS | Status: AC
  Filled 2020-05-09: qty 100

## 2020-05-15 ENCOUNTER — Ambulatory Visit
Admission: RE | Admit: 2020-05-15 | Discharge: 2020-05-15 | Disposition: A | Discharge status: Home / Self Care | Payer: Medicaid Other | Source: Ambulatory Visit | Attending: Infectious Diseases | Admitting: Infectious Diseases

## 2020-05-15 MED ORDER — DEXTROSE 5 % IV SOLN: 500.0000 mg | Freq: Once | INTRAVENOUS | Status: DC

## 2020-05-16 ENCOUNTER — Ambulatory Visit
Admission: RE | Admit: 2020-05-16 | Discharge: 2020-05-16 | Disposition: A | Discharge status: Home / Self Care | Payer: Medicaid Other | Source: Ambulatory Visit | Attending: Infectious Diseases | Admitting: Infectious Diseases

## 2020-05-16 ENCOUNTER — Other Ambulatory Visit: Payer: Self-pay

## 2020-09-27 ENCOUNTER — Encounter: Payer: Self-pay | Admitting: Emergency Medicine

## 2020-09-27 ENCOUNTER — Other Ambulatory Visit: Payer: Self-pay

## 2020-09-27 ENCOUNTER — Emergency Department
Admission: EM | Admit: 2020-09-27 | Discharge: 2020-09-27 | Disposition: A | Discharge status: Home / Self Care | Payer: Medicaid Other | Attending: Emergency Medicine | Admitting: Emergency Medicine

## 2020-09-27 DIAGNOSIS — R21 Rash and other nonspecific skin eruption: Secondary | ICD-10-CM | POA: Insufficient documentation

## 2020-09-27 DIAGNOSIS — F17210 Nicotine dependence, cigarettes, uncomplicated: Secondary | ICD-10-CM | POA: Insufficient documentation

## 2020-09-27 MED ORDER — CLINDAMYCIN HCL 150 MG PO CAPS
300.0000 mg | ORAL_CAPSULE | Freq: Three times a day (TID) | ORAL | 0 refills | Status: AC
  Filled 2020-09-27: qty 14, 3d supply, fill #0
  Filled 2020-10-02: qty 46, 16d supply, fill #1

## 2020-09-27 MED ORDER — FLUCONAZOLE 150 MG PO TABS: 150.0000 mg | ORAL_TABLET | ORAL | 0 refills | Status: DC

## 2020-09-27 MED ORDER — FLUCONAZOLE 150 MG PO TABS
150.0000 mg | ORAL_TABLET | ORAL | 0 refills | Status: DC
  Filled 2020-09-27: qty 5, 10d supply, fill #0

## 2020-09-27 MED ORDER — CLINDAMYCIN HCL 300 MG PO CAPS: 300.0000 mg | ORAL_CAPSULE | Freq: Three times a day (TID) | ORAL | 0 refills | Status: DC

## 2020-09-27 NOTE — ED Provider Notes (Signed)
[facility] Emergency Department Provider Note   ____________________________________________   Event Date/Time   First MD Initiated Contact with Patient 09/27/20 1104     (approximate)  I have reviewed the triage vital signs and the nursing notes.   HISTORY  Chief Complaint Rash    HPI Susan Terry is a 45 y.o. female who reports a rash which consists of some scabs on the right upper lateral forehead and right cheek left cheek toward the angle of the chin and the right corner of the mouth.  The right corner of the mouth is actually a red raised area about the size of a quarter with a definite about a size of a dime or slightly larger in the center of the raised area.  This is not a blister is just soft tissue that is inflamed and indented in the middle.  There is some honey colored crusting on the reddened raised area very fine crust.  Patient reports there is some drainage from this that is clumps of she says tissue gray and white and yellow in color.  That happened this morning.  The wound does not appear to be an abscess.  I see no hole in the skin where things were drained out of.  Patient has had MRSA in the past.        Past Medical History:  Diagnosis Date   Drug abuse (HCC)    Osteomyelitis [facility])     Patient Active Problem List   Diagnosis Date Noted   Vagina, candidiasis    Cellulitis of hand    Leukocytosis    Osteomyelitis of vertebra of cervical region (HCC) 04/01/2020   IVDU (intravenous drug user) 04/01/2020   Heroin abuse (HCC) 04/01/2020   Neck pain 04/01/2020   Cervical discitis 03/31/2020   Tobacco use disorder 07/20/2018   Abscess of right forearm    MRSA cellulitis 04/06/2017    Past Surgical History:  Procedure Laterality Date   BACK SURGERY     INCISION AND DRAINAGE FOREARM / WRIST DEEP Right     Prior to Admission medications   Medication Sig Start Date End Date Taking? Authorizing Provider  clindamycin  (CLEOCIN) 300 MG capsule Take 1 capsule (300 mg total) by mouth 3 (three) times daily for 10 days. 09/27/20 10/07/20 Yes Arnaldo Natal, MD  fluconazole (DIFLUCAN) 150 MG tablet Take 1 tablet (150 mg total) by mouth every other day. 09/27/20  Yes Arnaldo Natal, MD  buprenorphine-naloxone (SUBOXONE) 8-2 mg SUBL SL tablet Place 1 tablet under the tongue 2 (two) times daily. 04/17/20   Danford, Earl Lites, MD  ciprofloxacin (CIPRO) 500 MG tablet TAKE ONE TABLET BY MOUTH 2 TIMES A DAY FOR 21 DAYS 04/17/20 04/17/21  Alford Highland, MD  fluconazole (DIFLUCAN) 100 MG tablet TAKE ONE TABLET BY MOUTH EVERY DAY FOR 7 DAYS 04/17/20 04/17/21  Alford Highland, MD  folic acid (FOLVITE) 1 MG tablet Take 1 tablet (1 mg total) by mouth daily. 04/18/20   Alford Highland, MD  folic acid (FOLVITE) 1 MG tablet TAKE ONE TABLET BY MOUTH EVERY DAY 04/17/20 04/17/21  Alford Highland, MD  thiamine (VITAMIN B-1) 100 MG tablet TAKE ONE TABLET BY MOUTH EVERY DAY 04/17/20 04/17/21  Alford Highland, MD  thiamine 100 MG tablet Take 1 tablet (100 mg total) by mouth daily. 04/18/20   Alford Highland, MD    Allergies Patient has no known allergies.  Family History  Problem Relation Age of Onset   Cancer  Mother        cancer throughout family    Social History Social History   Tobacco Use   Smoking status: Every Day    Packs/day: 0.50    Types: Cigarettes   Smokeless tobacco: Never  Vaping Use   Vaping Use: Former  Substance Use Topics   Alcohol use: No   Drug use: Not Currently    Types: Methamphetamines, IV    Comment: heroin    Review of Systems  Constitutional: No fever/chills Eyes: No visual changes. ENT: No sore throat. Cardiovascular: Denies chest pain. Respiratory: Denies shortness of breath. Gastrointestinal: No abdominal pain.  No nausea, no vomiting.  No diarrhea.  No constipation. Genitourinary: Negative for dysuria. Musculoskeletal: Negative for back pain. Skin: Negative for rash. Neurological:  Negative for headaches, focal weakness  ____________________________________________   PHYSICAL EXAM:  VITAL SIGNS: ED Triage Vitals  Enc Vitals Group     BP 09/27/20 1005 128/87     Pulse Rate 09/27/20 1005 96     Resp 09/27/20 1005 16     Temp 09/27/20 1005 98.3 F (36.8 C)     Temp Source 09/27/20 1005 Oral     SpO2 09/27/20 1005 100 %     Weight 09/27/20 1006 145 lb (65.8 kg)     Height 09/27/20 1006 5\' 7"  (1.702 m)     Head Circumference --      Peak Flow --      Pain Score 09/27/20 1005 0     Pain Loc --      Pain Edu? --      Excl. in GC? --     Constitutional: Alert and oriented. Well appearing and in no acute distress. Eyes: Conjunctivae are normal. PER Head: Atraumatic. Nose: No congestion/rhinnorhea. Mouth/Throat: Mucous membranes are moist.  Oropharynx non-erythematous. Neck: No stridor.   Cardiovascular: Normal rate, regular rhythm. Grossly normal heart sounds.  Good peripheral circulation. Respiratory: Normal respiratory effort.  No retractions. Lungs CTA Neurologic:  Normal speech and language. No gross focal neurologic deficits are appreciated. No gait instability. Skin:  Skin is warm, dry and intact. No rash noted except as described in HPI   ____________________________________________   LABS (all labs ordered are listed, but only abnormal results are displayed)  Labs Reviewed - No data to display ____________________________________________  EKG   ____________________________________________  RADIOLOGY 11/27/20, personally viewed and evaluated these images (plain radiographs) as part of my medical decision making, as well as reviewing the written report by the radiologist.  ED MD interpretation:    Official radiology report(s): No results found.  ____________________________________________   PROCEDURES  Procedure(s) performed (including Critical  Care):  Procedures   ____________________________________________   INITIAL IMPRESSION / ASSESSMENT AND PLAN / ED COURSE  Rash has some characteristics of a bacterial infection possibly staph or strep with the history and the crusting.  I will try clindamycin first.  The fact that it is at the corner of the mouth makes me somewhat suspicious for a yeast infection as well.  That the persistence of it as the patient describes it has been present for many weeks.  I will give her some Diflucan every other day just in case.  I have advised her to follow-up with dermatology.  If they can see her quickly I would rather them see her before she gets prescriptions filled I have indicated that to her.              ____________________________________________  FINAL CLINICAL IMPRESSION(S) / ED DIAGNOSES  Final diagnoses:  Rash     ED Discharge Orders          Ordered    clindamycin (CLEOCIN) 300 MG capsule  3 times daily        09/27/20 1126    fluconazole (DIFLUCAN) 150 MG tablet  Every other day        09/27/20 1126             Note:  This document was prepared using Dragon voice recognition software and may include unintentional dictation errors.    Arnaldo Natal, MD 09/27/20 [phone]

## 2020-09-27 NOTE — Discharge Instructions (Addendum)
I am going to give you some clindamycin 1 pill 3 times a day in case this is a bacterial infection and some Diflucan 1 every other day for 5 doses and can there is a yeast.  I have given you the names of the 2 dermatology offices in town.  Please call them and see if you can get an appointment.  Do that before you start the medications.  If they can see you quickly then see them before you get the prescriptions filled.  If they cannot see you quickly try the prescriptions first.

## 2020-09-27 NOTE — ED Notes (Signed)
Pt asking for wifi password and tv channel to be changed. Apologized for delay, explained will be roomed as soon as available

## 2020-09-27 NOTE — ED Triage Notes (Signed)
Pt to ED for papules to facial area only. Reports this is has been going on for a year and PCP did not recommend anything a year ago. States it just worsened Reports heroin use

## 2020-09-28 ENCOUNTER — Other Ambulatory Visit: Payer: Self-pay

## 2020-10-02 ENCOUNTER — Other Ambulatory Visit: Payer: Self-pay

## 2020-12-08 ENCOUNTER — Encounter: Payer: Self-pay | Admitting: Emergency Medicine

## 2020-12-08 ENCOUNTER — Other Ambulatory Visit: Payer: Self-pay

## 2020-12-08 DIAGNOSIS — A419 Sepsis, unspecified organism: Secondary | ICD-10-CM | POA: Insufficient documentation

## 2020-12-08 DIAGNOSIS — L03113 Cellulitis of right upper limb: Secondary | ICD-10-CM | POA: Insufficient documentation

## 2020-12-08 DIAGNOSIS — L03114 Cellulitis of left upper limb: Secondary | ICD-10-CM | POA: Insufficient documentation

## 2020-12-08 DIAGNOSIS — F17210 Nicotine dependence, cigarettes, uncomplicated: Secondary | ICD-10-CM | POA: Insufficient documentation

## 2020-12-08 NOTE — ED Triage Notes (Signed)
Pt in with bilateral hand wounds at heroine injection sites. States the wounds started to appear 1 wk ago, says they are draining serosanguinous and gray output. Red, swollen and warm to touch. says last drug use yesterday.

## 2020-12-09 ENCOUNTER — Emergency Department
Admission: EM | Admit: 2020-12-09 | Discharge: 2020-12-09 | Disposition: A | Discharge status: Home / Self Care | Payer: Medicaid Other | Attending: Emergency Medicine | Admitting: Emergency Medicine

## 2020-12-09 ENCOUNTER — Encounter: Payer: Self-pay | Admitting: Radiology

## 2020-12-09 ENCOUNTER — Emergency Department: Payer: Medicaid Other

## 2020-12-09 DIAGNOSIS — L03119 Cellulitis of unspecified part of limb: Secondary | ICD-10-CM

## 2020-12-09 LAB — LACTIC ACID, PLASMA
Lactic Acid, Venous: 0.9 mmol/L (ref 0.5–1.9)
Lactic Acid, Venous: 0.9 mmol/L (ref 0.5–1.9)

## 2020-12-09 LAB — CBC WITH DIFFERENTIAL/PLATELET
Abs Immature Granulocytes: 0.02 10*3/uL (ref 0.00–0.07)
Basophils Absolute: 0.1 10*3/uL (ref 0.0–0.1)
Basophils Relative: 1 %
Eosinophils Absolute: 0.1 10*3/uL (ref 0.0–0.5)
Eosinophils Relative: 2 %
HCT: 37.5 % (ref 36.0–46.0)
Hemoglobin: 12.4 g/dL (ref 12.0–15.0)
Immature Granulocytes: 0 %
Lymphocytes Relative: 32 %
Lymphs Abs: 2 10*3/uL (ref 0.7–4.0)
MCH: 26.8 pg (ref 26.0–34.0)
MCHC: 33.1 g/dL (ref 30.0–36.0)
MCV: 81.2 fL (ref 80.0–100.0)
Monocytes Absolute: 0.4 10*3/uL (ref 0.1–1.0)
Monocytes Relative: 7 %
Neutro Abs: 3.7 10*3/uL (ref 1.7–7.7)
Neutrophils Relative %: 58 %
Platelets: 179 10*3/uL (ref 150–400)
RBC: 4.62 MIL/uL (ref 3.87–5.11)
RDW: 14.3 % (ref 11.5–15.5)
WBC: 6.4 10*3/uL (ref 4.0–10.5)
nRBC: 0 % (ref 0.0–0.2)

## 2020-12-09 LAB — URINALYSIS, ROUTINE W REFLEX MICROSCOPIC
Bacteria, UA: NONE SEEN
Bilirubin Urine: NEGATIVE
Glucose, UA: NEGATIVE mg/dL
Hgb urine dipstick: NEGATIVE
Ketones, ur: NEGATIVE mg/dL
Nitrite: NEGATIVE
Protein, ur: NEGATIVE mg/dL
Specific Gravity, Urine: 1.015 (ref 1.005–1.030)
WBC, UA: >50 WBC/hpf — ABNORMAL HIGH (ref 0–5)
pH: 8 (ref 5.0–8.0)

## 2020-12-09 LAB — POC URINE PREG, ED: Preg Test, Ur: NEGATIVE

## 2020-12-09 LAB — COMPREHENSIVE METABOLIC PANEL
ALT: 23 U/L (ref 0–44)
AST: 23 U/L (ref 15–41)
Albumin: 3.3 g/dL — ABNORMAL LOW (ref 3.5–5.0)
Alkaline Phosphatase: 98 U/L (ref 38–126)
Anion gap: 7 (ref 5–15)
BUN: 8 mg/dL (ref 6–20)
CO2: 26 mmol/L (ref 22–32)
Calcium: 8.6 mg/dL — ABNORMAL LOW (ref 8.9–10.3)
Chloride: 101 mmol/L (ref 98–111)
Creatinine, Ser: 0.61 mg/dL (ref 0.44–1.00)
GFR, Estimated: >60 mL/min (ref >60)
Glucose, Bld: 103 mg/dL — ABNORMAL HIGH (ref 70–99)
Potassium: 3.8 mmol/L (ref 3.5–5.1)
Sodium: 134 mmol/L — ABNORMAL LOW (ref 135–145)
Total Bilirubin: 0.5 mg/dL (ref 0.3–1.2)
Total Protein: 7.6 g/dL (ref 6.5–8.1)

## 2020-12-09 LAB — PROTIME-INR
INR: 0.9 (ref 0.8–1.2)
Prothrombin Time: 11.7 seconds (ref 11.4–15.2)

## 2020-12-09 IMAGING — CR DG HAND COMPLETE 3+V*R*
3 series · 3 of 3 positions shown · non-contrast
Comparison: None.

CLINICAL DATA: Sepsis, IV drug use, right hand wound

EXAM:
RIGHT HAND - COMPLETE 3+ VIEW

[hand ap]
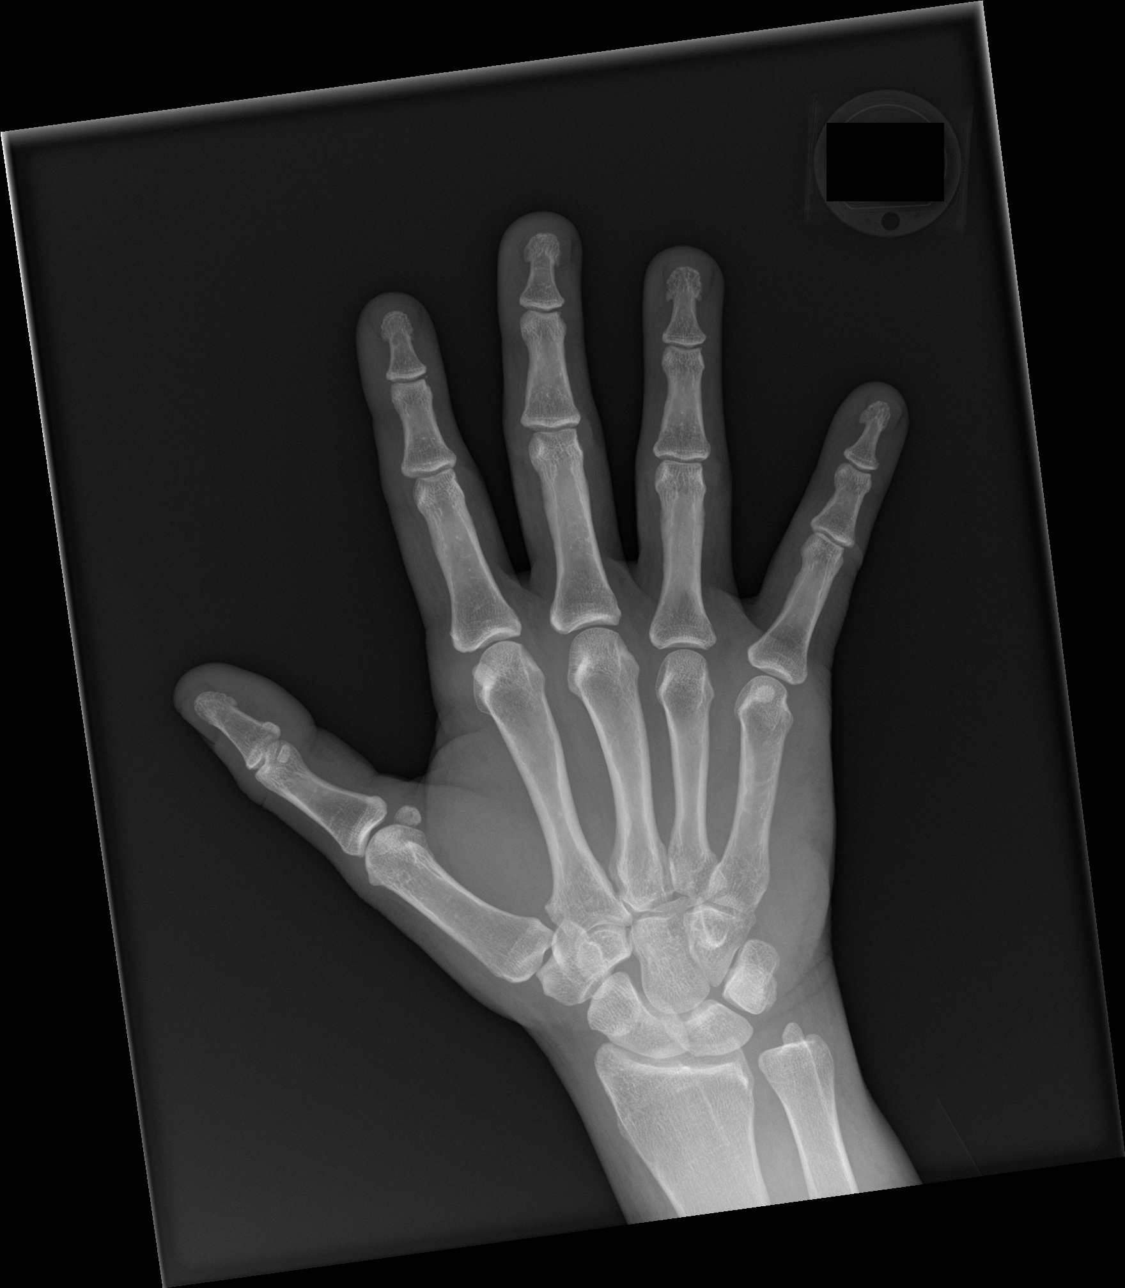

[hand obl]
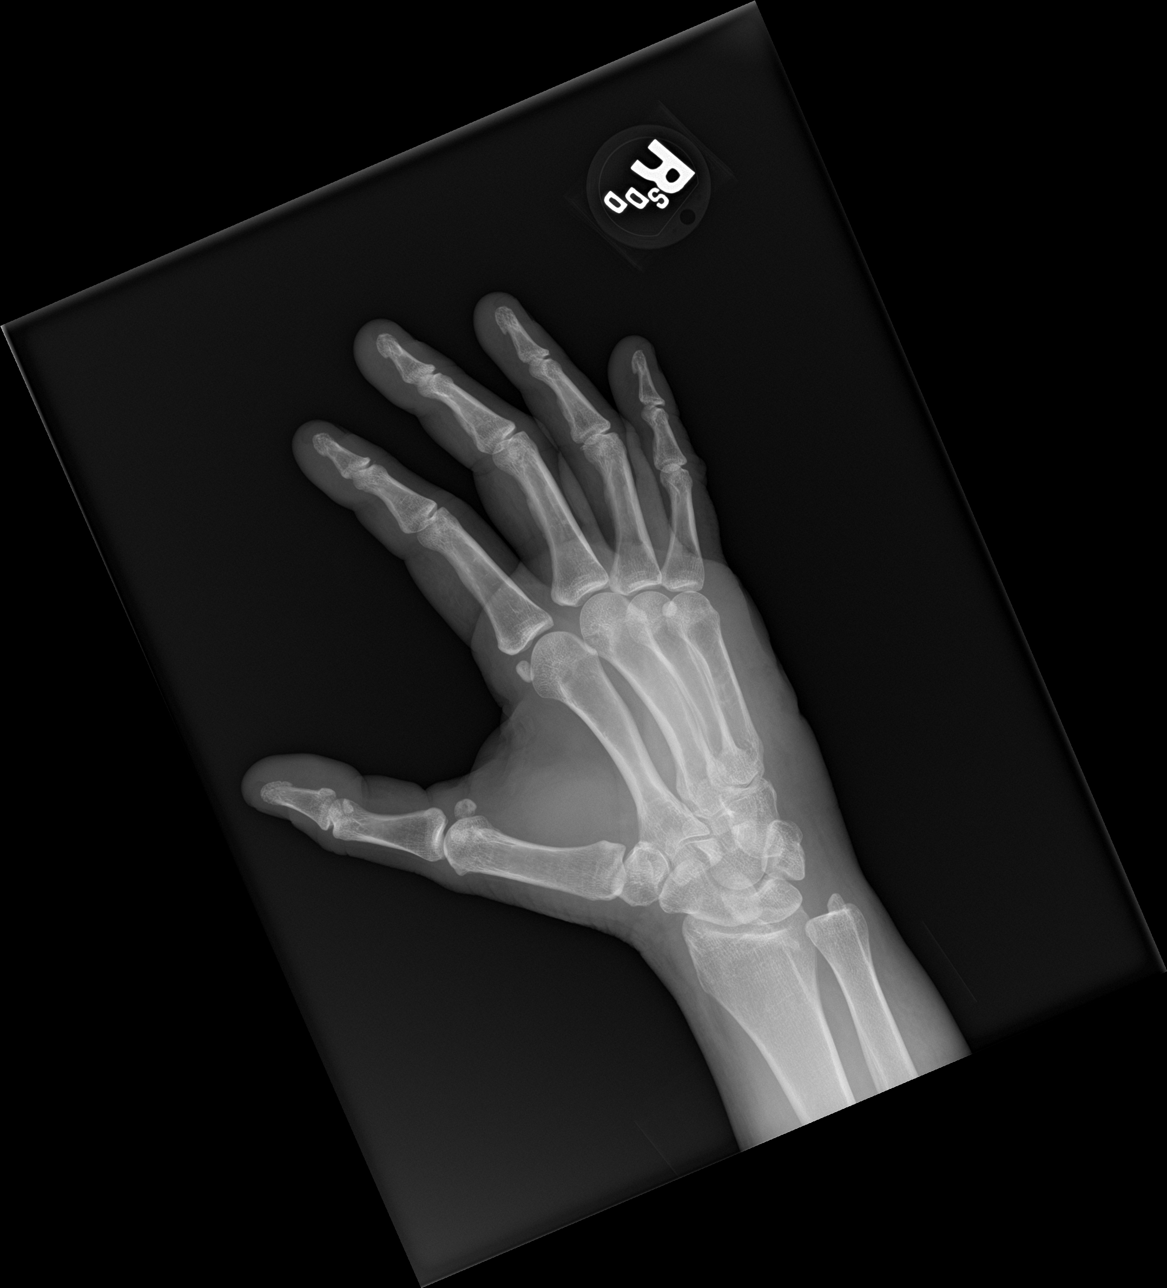

[hand lat]
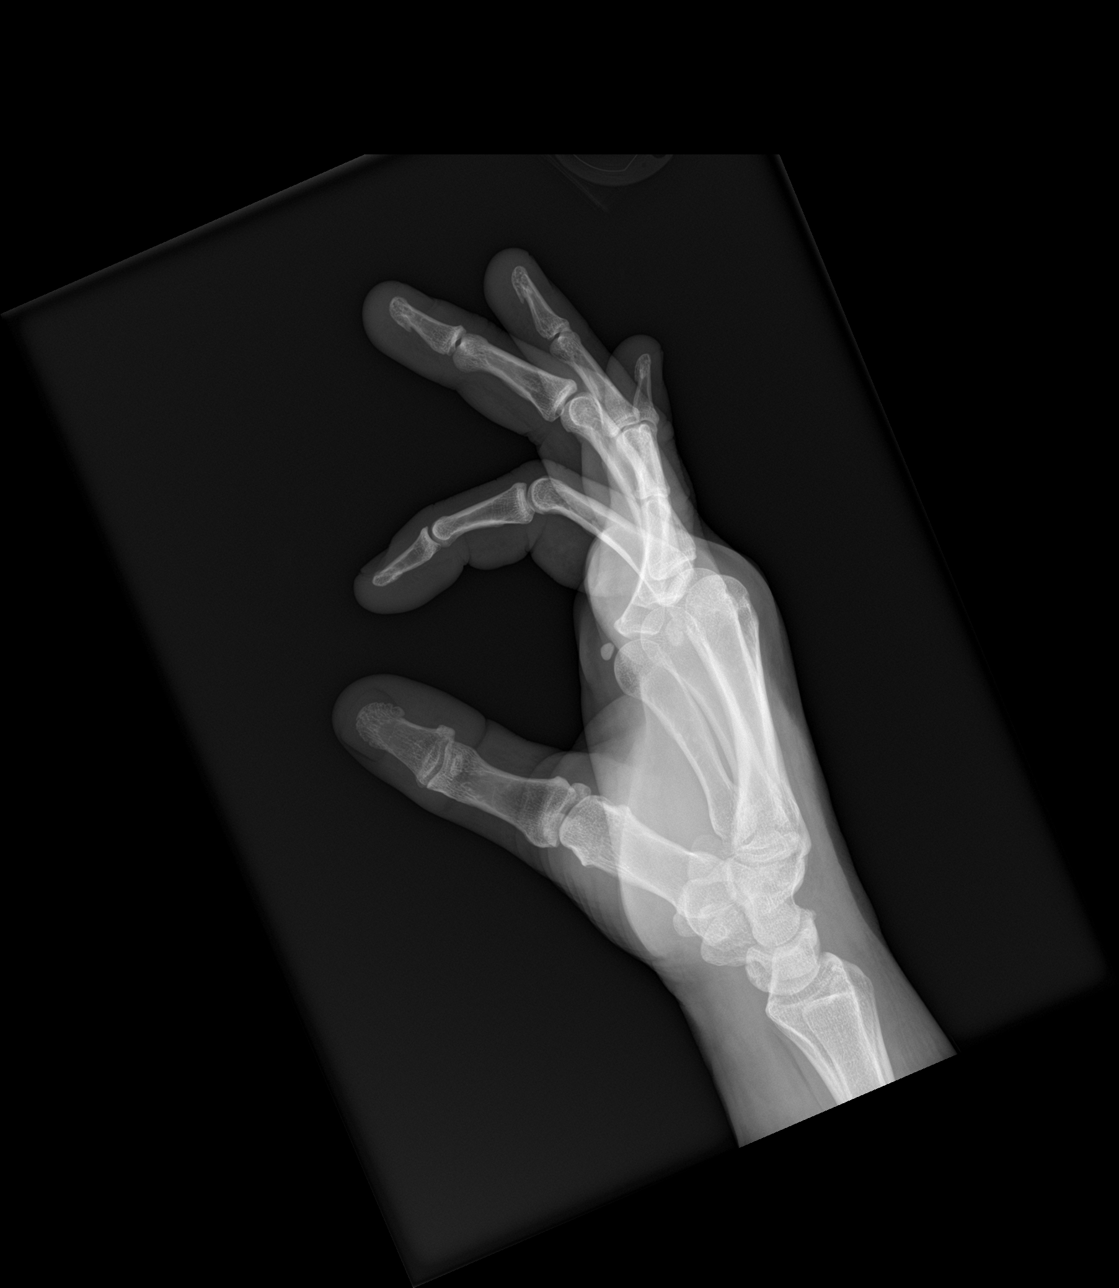

[3 of 3 positions shown; findings below may reference images not displayed]

FINDINGS: There is no evidence of fracture or dislocation. There is no
evidence of arthropathy or other focal bone abnormality. Mild soft
tissue swelling within the palmar and thenar regions. No retained
radiopaque foreign body.
IMPRESSION: Soft tissue swelling.  No fracture or dislocation.

## 2020-12-09 IMAGING — CR DG CHEST 2V
2 series · 2 of 2 positions shown · non-contrast
Comparison: None.

CLINICAL DATA: Sepsis, IV drug abuse

EXAM:
CHEST - 2 VIEW

[chest pa]
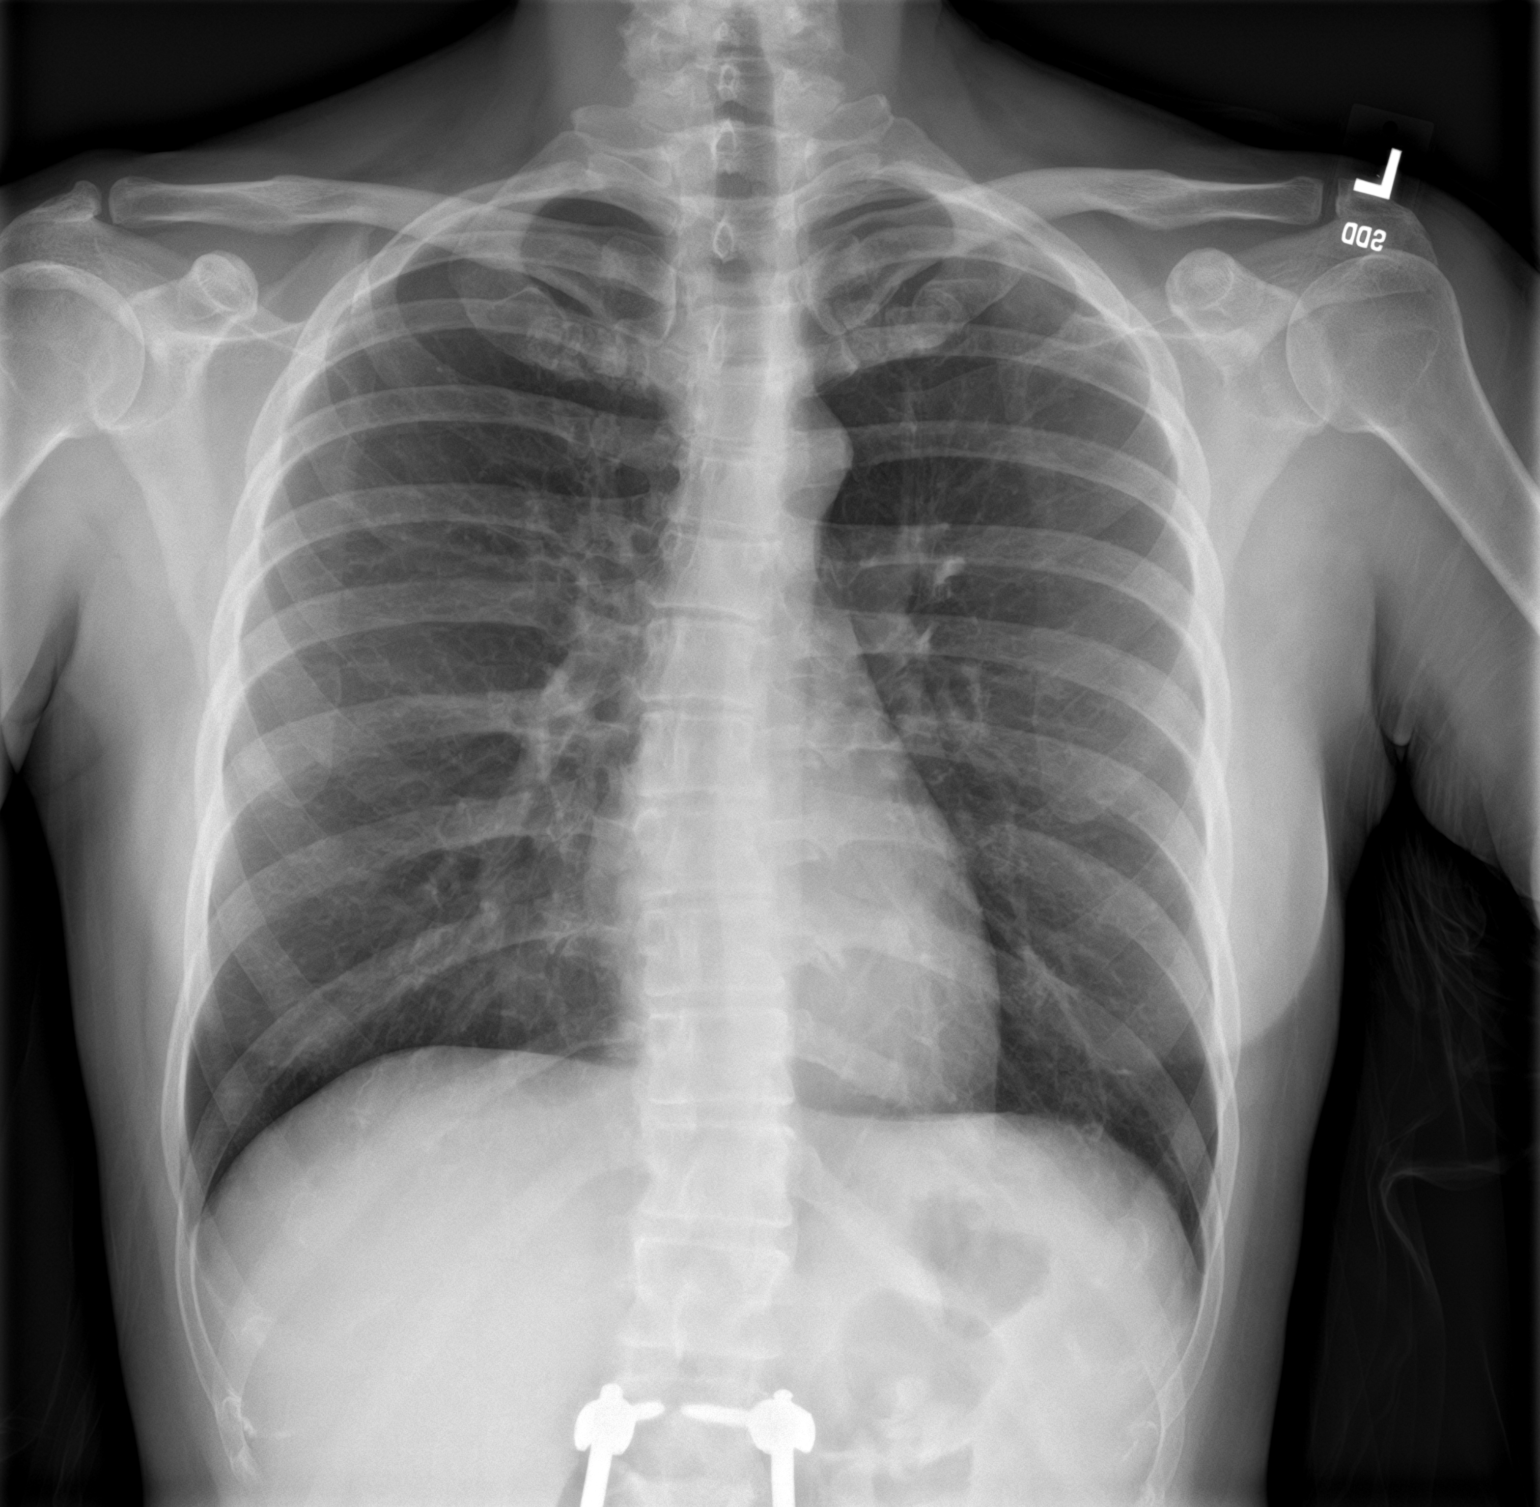

[chest lat]
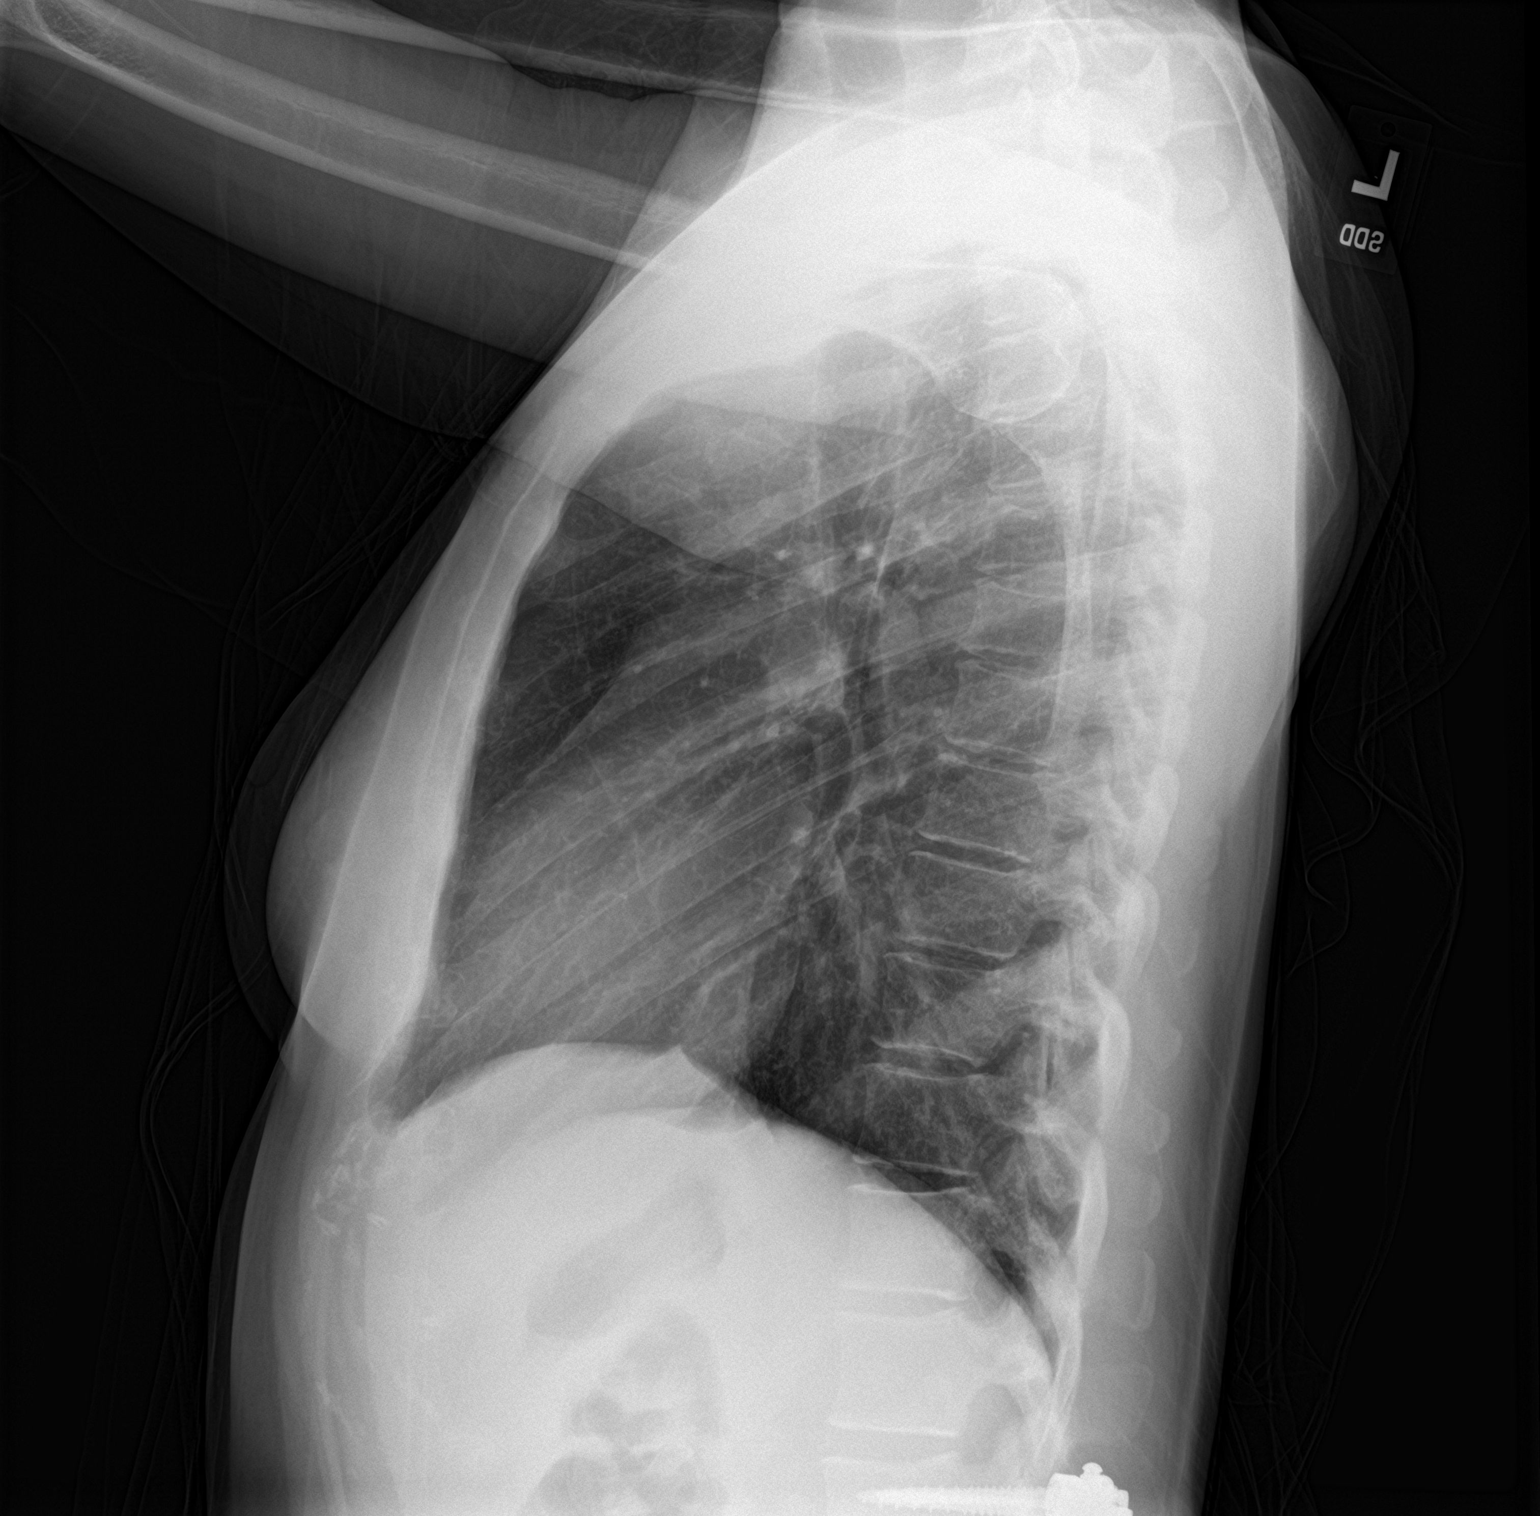

[2 of 2 positions shown; findings below may reference images not displayed]

FINDINGS: The heart size and mediastinal contours are within normal limits.
Both lungs are clear. The visualized skeletal structures are
unremarkable.
IMPRESSION: No active cardiopulmonary disease.

## 2020-12-09 MED ORDER — CEPHALEXIN 500 MG PO CAPS: 500.0000 mg | ORAL_CAPSULE | Freq: Two times a day (BID) | ORAL | 0 refills | Status: DC

## 2020-12-09 MED ORDER — SULFAMETHOXAZOLE-TRIMETHOPRIM 800-160 MG PO TABS: 1.0000 | ORAL_TABLET | Freq: Two times a day (BID) | ORAL | 0 refills | Status: DC

## 2020-12-09 NOTE — ED Provider Notes (Signed)
HPI: Pt is a 45 y.o. female who presents with complaints of bilateral hand wounds  The patient p/w  abscess of hands- with drainage- from IV drug use   ROS: Denies fever, chest pain, vomiting  Past Medical History:  Diagnosis Date   Drug abuse (HCC)    Osteomyelitis (HCC)    Vitals:   12/08/20 2354  BP: 124/86  Pulse: 87  Resp: 15  Temp: 99.1 F (37.3 C)  SpO2: 96%    Focused Physical Exam: Gen: No acute distress Head: atraumatic, normocephalic Eyes: Extraocular movements grossly intact; conjunctiva clear CV: RRR Lung: No increased WOB, no stridor GI: ND, no obvious masses Neuro: Alert and awake Swelling noted with some drainage from left hand   Medical Decision Making and Plan: Given the patient's initial medical screening exam, the following diagnostic evaluation has been ordered. The patient will be placed in the appropriate treatment space, once one is available, to complete the evaluation and treatment. I have discussed the plan of care with the patient and I have advised the patient that an ED physician or mid-level practitioner will reevaluate their condition after the test results have been received, as the results may give them additional insight into the type of treatment they may need.   Diagnostics: labs   Treatments: none immediately   Concha Se, MD 12/09/20 0006

## 2020-12-09 NOTE — ED Provider Notes (Signed)
[facility] Emergency Department Provider Note   ____________________________________________    I have reviewed the triage vital signs and the nursing notes.   HISTORY  Chief Complaint Wound Infection     HPI Susan Terry is a 45 y.o. female with history of heroin abuse and osteomyelitis presents with complaints of rash to her hands bilaterally.  Patient reports redness and some discomfort around dorsal medial portions of her hands where she has been injecting heroin.  She denies fevers or chills.  No discharge.  Does not take anything for this.  As developed over the last week  Past Medical History:  Diagnosis Date   Drug abuse (HCC)    Osteomyelitis [facility])     Patient Active Problem List   Diagnosis Date Noted   Vagina, candidiasis    Cellulitis of hand    Leukocytosis    Osteomyelitis of vertebra of cervical region (HCC) 04/01/2020   IVDU (intravenous drug user) 04/01/2020   Heroin abuse (HCC) 04/01/2020   Neck pain 04/01/2020   Cervical discitis 03/31/2020   Tobacco use disorder 07/20/2018   Abscess of right forearm    MRSA cellulitis 04/06/2017    Past Surgical History:  Procedure Laterality Date   BACK SURGERY     INCISION AND DRAINAGE FOREARM / WRIST DEEP Right     Prior to Admission medications   Medication Sig Start Date End Date Taking? Authorizing Provider  cephALEXin (KEFLEX) 500 MG capsule Take 1 capsule (500 mg total) by mouth 2 (two) times daily. 12/09/20  Yes Jene Every, MD  sulfamethoxazole-trimethoprim (BACTRIM DS) 800-160 MG tablet Take 1 tablet by mouth 2 (two) times daily. 12/09/20  Yes Jene Every, MD  buprenorphine-naloxone (SUBOXONE) 8-2 mg SUBL SL tablet Place 1 tablet under the tongue 2 (two) times daily. 04/17/20   Danford, Earl Lites, MD  ciprofloxacin (CIPRO) 500 MG tablet TAKE ONE TABLET BY MOUTH 2 TIMES A DAY FOR 21 DAYS 04/17/20 04/17/21  Alford Highland, MD  fluconazole (DIFLUCAN) 100 MG  tablet TAKE ONE TABLET BY MOUTH EVERY DAY FOR 7 DAYS 04/17/20 04/17/21  Alford Highland, MD  fluconazole (DIFLUCAN) 150 MG tablet Take 1 tablet (150 mg total) by mouth once every other day. 09/27/20   Arnaldo Natal, MD  folic acid (FOLVITE) 1 MG tablet Take 1 tablet (1 mg total) by mouth daily. 04/18/20   Alford Highland, MD  folic acid (FOLVITE) 1 MG tablet TAKE ONE TABLET BY MOUTH EVERY DAY 04/17/20 04/17/21  Alford Highland, MD  thiamine (VITAMIN B-1) 100 MG tablet TAKE ONE TABLET BY MOUTH EVERY DAY 04/17/20 04/17/21  Alford Highland, MD  thiamine 100 MG tablet Take 1 tablet (100 mg total) by mouth daily. 04/18/20   Alford Highland, MD     Allergies Patient has no known allergies.  Family History  Problem Relation Age of Onset   Cancer Mother        cancer throughout family    Social History Social History   Tobacco Use   Smoking status: Every Day    Packs/day: 0.50    Types: Cigarettes   Smokeless tobacco: Never  Vaping Use   Vaping Use: Former  Substance Use Topics   Alcohol use: No   Drug use: Not Currently    Types: Methamphetamines, IV    Comment: heroin    Review of Systems  Constitutional: No fever/chills Eyes: No visual changes.  ENT: No sore throat. Cardiovascular: Denies chest pain. Respiratory: Denies shortness of breath.  Gastrointestinal: No abdominal pain.  No nausea, no vomiting.   Genitourinary: Negative for dysuria. Musculoskeletal: As above Skin: As above Neurological: Negative for headaches or weakness   ____________________________________________   PHYSICAL EXAM:  VITAL SIGNS: ED Triage Vitals  Enc Vitals Group     BP 12/08/20 2354 124/86     Pulse Rate 12/08/20 2354 87     Resp 12/08/20 2354 15     Temp 12/08/20 2354 99.1 F (37.3 C)     Temp Source 12/08/20 2354 Oral     SpO2 12/08/20 2354 96 %     Weight 12/08/20 2356 68 kg (150 lb)     Height --      Head Circumference --      Peak Flow --      Pain Score 12/08/20 2356 10      Pain Loc --      Pain Edu? --      Excl. in GC? --     Constitutional: Alert and oriented. No acute distress.  Eyes: Conjunctivae are normal.  Head: Atraumatic. Nose: No congestion/rhinnorhea. Mouth/Throat: Mucous membranes are moist.   Neck:  Painless ROM Cardiovascular: Normal rate, regular rhythm. Grossly normal heart sounds.  Good peripheral circulation. Respiratory: Normal respiratory effort.  No retractions. Lungs CTAB. Gastrointestinal: Soft and nontender. No distention.  No CVA tenderness. Genitourinary: deferred Musculoskeletal: No lower extremity tenderness nor edema.  Warm and well perfused Neurologic:  Normal speech and language. No gross focal neurologic deficits are appreciated.  Skin:  Skin is warm, dry and intact.  Dorsal medial aspect of hands bilaterally small areas of erythema no fluctuance, no discharge consistent with cellulitic change, does not involve the fingers Psychiatric: Mood and affect are normal. Speech and behavior are normal.  ____________________________________________   LABS (all labs ordered are listed, but only abnormal results are displayed)  Labs Reviewed  COMPREHENSIVE METABOLIC PANEL - Abnormal; Notable for the following components:      Result Value   Sodium 134 (*)    Glucose, Bld 103 (*)    Calcium 8.6 (*)    Albumin 3.3 (*)    All other components within normal limits  URINALYSIS, ROUTINE W REFLEX MICROSCOPIC - Abnormal; Notable for the following components:   Leukocytes,Ua LARGE (*)    WBC, UA >50 (*)    All other components within normal limits  CULTURE, BLOOD (ROUTINE X 2)  AEROBIC CULTURE W GRAM STAIN (SUPERFICIAL SPECIMEN)  CULTURE, BLOOD (ROUTINE X 2) W REFLEX TO ID PANEL  LACTIC ACID, PLASMA  LACTIC ACID, PLASMA  CBC WITH DIFFERENTIAL/PLATELET  PROTIME-INR  POC URINE PREG, ED    ____________________________________________  EKG   ____________________________________________  RADIOLOGY   ____________________________________________   PROCEDURES  Procedure(s) performed: No  Procedures   Critical Care performed: No ____________________________________________   INITIAL IMPRESSION / ASSESSMENT AND PLAN / ED COURSE  Pertinent labs & imaging results that were available during my care of the patient were reviewed by me and considered in my medical decision making (see chart for details).   Patient overall well-appearing and in no acute distress, afebrile here, normal white blood cell count, lactic acid is normal.  Presentation is most consistent with localized cellulitic change, no evidence of sepsis.  Detox resources provided to the patient, encouraged completion of full course of antibiotics, return precautions discussed    ____________________________________________   FINAL CLINICAL IMPRESSION(S) / ED DIAGNOSES  Final diagnoses:  Cellulitis of upper extremity, unspecified laterality  Note:  This document was prepared using Dragon voice recognition software and may include unintentional dictation errors.    Jene Every, MD 12/09/20 1302

## 2020-12-11 LAB — AEROBIC CULTURE W GRAM STAIN (SUPERFICIAL SPECIMEN)

## 2020-12-12 ENCOUNTER — Other Ambulatory Visit: Payer: Self-pay

## 2020-12-12 NOTE — Progress Notes (Signed)
ED Antimicrobial Stewardship Positive Culture Follow Up   Susan Terry is an 45 y.o. female who presented to [facility] on 12/09/2020 with a chief complaint of wound infection on the hands.   Chief Complaint  Patient presents with   Wound Infection    Recent Results (from the past 720 hour(s))  Culture, blood (Routine x 2)     Status: None (Preliminary result)   Collection Time: 12/09/20 12:18 AM   Specimen: BLOOD  Result Value Ref Range Status   Specimen Description BLOOD LAC  Final   Special Requests   Final    BOTTLES DRAWN AEROBIC AND ANAEROBIC Blood Culture adequate volume   Culture   Final    NO GROWTH 3 DAYS Performed at [facility], [address] [place], [place] [address]    Report Status PENDING  Incomplete  Aerobic Culture w Gram Stain (superficial specimen)     Status: None   Collection Time: 12/09/20 12:18 AM   Specimen: Throat  Result Value Ref Range Status   Specimen Description   Final    THROAT Performed at [facility], [address], [place], [place] [address]    Special Requests   Final    NONE Performed at [facility], [address] [place], [place] [address]    Gram Stain   Final    FEW SQUAMOUS EPITHELIAL CELLS PRESENT FEW GRAM POSITIVE COCCI IN PAIRS AND CHAINS Performed at [facility], [address], [place], [place] [address]    Culture   Final    ABUNDANT METHICILLIN RESISTANT STAPHYLOCOCCUS AUREUS   Report Status 12/11/2020 FINAL  Final   Organism ID, Bacteria METHICILLIN RESISTANT STAPHYLOCOCCUS AUREUS  Final      Susceptibility   Methicillin resistant staphylococcus aureus - MIC*    CIPROFLOXACIN >=8 RESISTANT Resistant     ERYTHROMYCIN >=8 RESISTANT Resistant     GENTAMICIN >=16 RESISTANT Resistant     OXACILLIN >=4 RESISTANT Resistant     TETRACYCLINE >=16 RESISTANT Resistant     VANCOMYCIN 1 SENSITIVE Sensitive     TRIMETH/SULFA >=320 RESISTANT Resistant     CLINDAMYCIN >=8  RESISTANT Resistant     RIFAMPIN <=0.5 SENSITIVE Sensitive     Inducible Clindamycin NEGATIVE Sensitive     * ABUNDANT METHICILLIN RESISTANT STAPHYLOCOCCUS AUREUS    [x]  Treated with sulfamethoxazole/trimethoprim and cephalexin, organism resistant to prescribed antimicrobial []  Patient discharged originally without antimicrobial agent and treatment is now indicated  New antibiotic prescription: linezolid 600 mg every 12 hours x 7 days.  Medication Management has in stock. Spoke with patient, informed of prescription, provided counseling.   ED Provider: Dr. , PharmD Pharmacy Resident  12/12/2020 5:02 PM

## 2020-12-13 ENCOUNTER — Other Ambulatory Visit: Payer: Self-pay

## 2020-12-13 MED ORDER — LINEZOLID 600 MG PO TABS
ORAL_TABLET | ORAL | 0 refills | Status: DC
  Filled 2020-12-13: qty 14, 7d supply, fill #0

## 2020-12-14 LAB — CULTURE, BLOOD (ROUTINE X 2)
Culture: NO GROWTH
Special Requests: ADEQUATE

## 2021-02-25 ENCOUNTER — Emergency Department
Admission: EM | Admit: 2021-02-25 | Discharge: 2021-02-25 | Disposition: A | Discharge status: Home / Self Care | Payer: Medicaid Other | Attending: Emergency Medicine | Admitting: Emergency Medicine

## 2021-02-25 ENCOUNTER — Other Ambulatory Visit: Payer: Self-pay

## 2021-02-25 DIAGNOSIS — X58XXXA Exposure to other specified factors, initial encounter: Secondary | ICD-10-CM | POA: Insufficient documentation

## 2021-02-25 DIAGNOSIS — S61402A Unspecified open wound of left hand, initial encounter: Secondary | ICD-10-CM | POA: Insufficient documentation

## 2021-02-25 DIAGNOSIS — R220 Localized swelling, mass and lump, head: Secondary | ICD-10-CM | POA: Insufficient documentation

## 2021-02-25 DIAGNOSIS — Z5321 Procedure and treatment not carried out due to patient leaving prior to being seen by health care provider: Secondary | ICD-10-CM | POA: Insufficient documentation

## 2021-02-25 DIAGNOSIS — S61401A Unspecified open wound of right hand, initial encounter: Secondary | ICD-10-CM | POA: Insufficient documentation

## 2021-02-25 LAB — CBC WITH DIFFERENTIAL/PLATELET
Abs Immature Granulocytes: 0.02 10*3/uL (ref 0.00–0.07)
Basophils Absolute: 0 10*3/uL (ref 0.0–0.1)
Basophils Relative: 1 %
Eosinophils Absolute: 0.1 10*3/uL (ref 0.0–0.5)
Eosinophils Relative: 2 %
HCT: 38.4 % (ref 36.0–46.0)
Hemoglobin: 12.1 g/dL (ref 12.0–15.0)
Immature Granulocytes: 0 %
Lymphocytes Relative: 27 %
Lymphs Abs: 1.4 10*3/uL (ref 0.7–4.0)
MCH: 25.8 pg — ABNORMAL LOW (ref 26.0–34.0)
MCHC: 31.5 g/dL (ref 30.0–36.0)
MCV: 81.9 fL (ref 80.0–100.0)
Monocytes Absolute: 0.3 10*3/uL (ref 0.1–1.0)
Monocytes Relative: 5 %
Neutro Abs: 3.3 10*3/uL (ref 1.7–7.7)
Neutrophils Relative %: 65 %
Platelets: 159 10*3/uL (ref 150–400)
RBC: 4.69 MIL/uL (ref 3.87–5.11)
RDW: 13.5 % (ref 11.5–15.5)
WBC: 5.1 10*3/uL (ref 4.0–10.5)
nRBC: 0 % (ref 0.0–0.2)

## 2021-02-25 LAB — COMPREHENSIVE METABOLIC PANEL
ALT: 20 U/L (ref 0–44)
AST: 30 U/L (ref 15–41)
Albumin: 3.5 g/dL (ref 3.5–5.0)
Alkaline Phosphatase: 98 U/L (ref 38–126)
Anion gap: 15 (ref 5–15)
BUN: 26 mg/dL — ABNORMAL HIGH (ref 6–20)
CO2: 18 mmol/L — ABNORMAL LOW (ref 22–32)
Calcium: 9 mg/dL (ref 8.9–10.3)
Chloride: 101 mmol/L (ref 98–111)
Creatinine, Ser: 1.23 mg/dL — ABNORMAL HIGH (ref 0.44–1.00)
GFR, Estimated: 56 mL/min — ABNORMAL LOW (ref >60)
Glucose, Bld: 431 mg/dL — ABNORMAL HIGH (ref 70–99)
Potassium: 4.2 mmol/L (ref 3.5–5.1)
Sodium: 134 mmol/L — ABNORMAL LOW (ref 135–145)
Total Bilirubin: 0.8 mg/dL (ref 0.3–1.2)
Total Protein: 6.6 g/dL (ref 6.5–8.1)

## 2021-02-25 LAB — LACTIC ACID, PLASMA: Lactic Acid, Venous: 0.8 mmol/L (ref 0.5–1.9)

## 2021-02-25 NOTE — ED Triage Notes (Signed)
Patient to ER via POV with complaints of possible wound infection and dental abscess. Reports already being seen for wound present to left hand and completed course of antibiotics but was "burned" and wound didn't heal. Wound has been draining. Wound also present to right hand. Swelling present to right side of jaw.

## 2021-02-26 ENCOUNTER — Other Ambulatory Visit: Payer: Self-pay

## 2021-02-26 ENCOUNTER — Encounter: Payer: Self-pay | Admitting: Emergency Medicine

## 2021-02-26 ENCOUNTER — Emergency Department
Admission: EM | Admit: 2021-02-26 | Discharge: 2021-02-26 | Disposition: A | Discharge status: Home / Self Care | Payer: Medicaid Other | Attending: Emergency Medicine | Admitting: Emergency Medicine

## 2021-02-26 DIAGNOSIS — M79642 Pain in left hand: Secondary | ICD-10-CM | POA: Insufficient documentation

## 2021-02-26 DIAGNOSIS — B9689 Other specified bacterial agents as the cause of diseases classified elsewhere: Secondary | ICD-10-CM | POA: Insufficient documentation

## 2021-02-26 DIAGNOSIS — M79641 Pain in right hand: Secondary | ICD-10-CM | POA: Insufficient documentation

## 2021-02-26 DIAGNOSIS — N390 Urinary tract infection, site not specified: Secondary | ICD-10-CM | POA: Insufficient documentation

## 2021-02-26 DIAGNOSIS — R61 Generalized hyperhidrosis: Secondary | ICD-10-CM | POA: Insufficient documentation

## 2021-02-26 DIAGNOSIS — K047 Periapical abscess without sinus: Secondary | ICD-10-CM | POA: Insufficient documentation

## 2021-02-26 DIAGNOSIS — H538 Other visual disturbances: Secondary | ICD-10-CM | POA: Insufficient documentation

## 2021-02-26 LAB — URINALYSIS, COMPLETE (UACMP) WITH MICROSCOPIC
Bilirubin Urine: NEGATIVE
Glucose, UA: NEGATIVE mg/dL
Ketones, ur: 15 mg/dL — AB
Nitrite: NEGATIVE
Specific Gravity, Urine: 1.02 (ref 1.005–1.030)
WBC, UA: >50 WBC/hpf — ABNORMAL HIGH (ref 0–5)
pH: 5.5 (ref 5.0–8.0)

## 2021-02-26 LAB — COMPREHENSIVE METABOLIC PANEL
ALT: 17 U/L (ref 0–44)
AST: 22 U/L (ref 15–41)
Albumin: 3.1 g/dL — ABNORMAL LOW (ref 3.5–5.0)
Alkaline Phosphatase: 106 U/L (ref 38–126)
Anion gap: 8 (ref 5–15)
BUN: 17 mg/dL (ref 6–20)
CO2: 25 mmol/L (ref 22–32)
Calcium: 8.9 mg/dL (ref 8.9–10.3)
Chloride: 101 mmol/L (ref 98–111)
Creatinine, Ser: 0.94 mg/dL (ref 0.44–1.00)
GFR, Estimated: >60 mL/min (ref >60)
Glucose, Bld: 120 mg/dL — ABNORMAL HIGH (ref 70–99)
Potassium: 3.5 mmol/L (ref 3.5–5.1)
Sodium: 134 mmol/L — ABNORMAL LOW (ref 135–145)
Total Bilirubin: 0.4 mg/dL (ref 0.3–1.2)
Total Protein: 7 g/dL (ref 6.5–8.1)

## 2021-02-26 LAB — CBC WITH DIFFERENTIAL/PLATELET
Abs Immature Granulocytes: 0.02 10*3/uL (ref 0.00–0.07)
Basophils Absolute: 0 10*3/uL (ref 0.0–0.1)
Basophils Relative: 1 %
Eosinophils Absolute: 0.1 10*3/uL (ref 0.0–0.5)
Eosinophils Relative: 1 %
HCT: 33.8 % — ABNORMAL LOW (ref 36.0–46.0)
Hemoglobin: 10.8 g/dL — ABNORMAL LOW (ref 12.0–15.0)
Immature Granulocytes: 0 %
Lymphocytes Relative: 21 %
Lymphs Abs: 1.2 10*3/uL (ref 0.7–4.0)
MCH: 26 pg (ref 26.0–34.0)
MCHC: 32 g/dL (ref 30.0–36.0)
MCV: 81.4 fL (ref 80.0–100.0)
Monocytes Absolute: 0.4 10*3/uL (ref 0.1–1.0)
Monocytes Relative: 7 %
Neutro Abs: 4.1 10*3/uL (ref 1.7–7.7)
Neutrophils Relative %: 70 %
Platelets: 151 10*3/uL (ref 150–400)
RBC: 4.15 MIL/uL (ref 3.87–5.11)
RDW: 13.7 % (ref 11.5–15.5)
WBC: 5.9 10*3/uL (ref 4.0–10.5)
nRBC: 0 % (ref 0.0–0.2)

## 2021-02-26 LAB — CBG MONITORING, ED: Glucose-Capillary: 95 mg/dL (ref 70–99)

## 2021-02-26 MED ORDER — CLINDAMYCIN PHOSPHATE 600 MG/50ML IV SOLN
600.0000 mg | Freq: Once | INTRAVENOUS | Status: AC
  Administered 2021-02-26: 600 mg via INTRAVENOUS
  Filled 2021-02-26: qty 50

## 2021-02-26 MED ORDER — SODIUM CHLORIDE 0.9 % IV BOLUS
1000.0000 mL | Freq: Once | INTRAVENOUS | Status: AC
  Administered 2021-02-26: 1000 mL via INTRAVENOUS

## 2021-02-26 MED ORDER — DOXYCYCLINE HYCLATE 100 MG PO CAPS
100.0000 mg | ORAL_CAPSULE | Freq: Two times a day (BID) | ORAL | 0 refills | Status: AC
  Filled 2021-02-26: qty 28, 14d supply, fill #0

## 2021-02-26 NOTE — ED Triage Notes (Signed)
Pt comes into the ED via POV c/o right lower dental pain that has caused facial swelling as well an wound to bilateral hands.  Pt states the wound on her hands are old wounds from IV drug use in her past.  Pt states both hand wounds are open and draining.  Pt does state that around her tooth pain, it also started draining last night and now it has improved pain.  Pt states she was seen yesterday and had blood work completed, but let before seeing the MD.

## 2021-02-26 NOTE — Discharge Instructions (Signed)
Follow-up with your regular doctor as needed.  Return emergency department worsening.  Take medication as prescribed. 

## 2021-02-26 NOTE — ED Notes (Signed)
See triage note  presents with right sided facial redness and swelling  possible dental abscess/infection  also has redness and swelling to hands  both hands and forearms have open wounds which are draining

## 2021-02-26 NOTE — ED Provider Notes (Signed)
[facility] Provider Note    Event Date/Time   First MD Initiated Contact with Patient 02/26/21 1330     (approximate)   History   Facial Swelling and Hand Injury   HPI  Susan Terry is a 46 y.o. female with history of IV drug use which she states is in remission presents emergency department with sores on her hands, facial swelling from a abscessed tooth which drained last night.  Patient states last week that she almost passed out at work have blurred vision and became very sweaty.  No history of diabetes.  No chest pain/shortness of breath      Physical Exam   Triage Vital Signs: ED Triage Vitals  Enc Vitals Group     BP 02/26/21 1258 135/78     Pulse Rate 02/26/21 1258 91     Resp 02/26/21 1258 17     Temp 02/26/21 1258 98.9 F (37.2 C)     Temp Source 02/26/21 1258 Oral     SpO2 02/26/21 1258 94 %     Weight 02/26/21 1256 149 lb 14.6 oz (68 kg)     Height 02/26/21 1256 5\' 7"  (1.702 m)     Head Circumference --      Peak Flow --      Pain Score 02/26/21 1256 0     Pain Loc --      Pain Edu? --      Excl. in GC? --     Most recent vital signs: Vitals:   02/26/21 1258  BP: 135/78  Pulse: 91  Resp: 17  Temp: 98.9 F (37.2 C)  SpO2: 94%     General: Awake, no distress.   CV:  Good peripheral perfusion. regular rate and  rhythm Resp:  Normal effort. Lungs c t a Abd:  No distention.   Other:  Large amount of facial swelling to the right mandible, poor dentition noted, sores noted on the hands bilaterally, neurovascular intact   ED Results / Procedures / Treatments   Labs (all labs ordered are listed, but only abnormal results are displayed) Labs Reviewed  COMPREHENSIVE METABOLIC PANEL - Abnormal; Notable for the following components:      Result Value   Sodium 134 (*)    Glucose, Bld 120 (*)    Albumin 3.1 (*)    All other components within normal limits  CBC WITH DIFFERENTIAL/PLATELET - Abnormal; Notable for the  following components:   Hemoglobin 10.8 (*)    HCT 33.8 (*)    All other components within normal limits  URINALYSIS, COMPLETE (UACMP) WITH MICROSCOPIC - Abnormal; Notable for the following components:   APPearance HAZY (*)    Hgb urine dipstick TRACE (*)    Ketones, ur 15 (*)    Protein, ur TRACE (*)    Leukocytes,Ua LARGE (*)    WBC, UA >50 (*)    Bacteria, UA RARE (*)    All other components within normal limits  URINE CULTURE  CBG MONITORING, ED     EKG     RADIOLOGY     PROCEDURES:   Procedures   MEDICATIONS ORDERED IN ED: Medications  clindamycin (CLEOCIN) IVPB 600 mg (600 mg Intravenous New Bag/Given 02/26/21 1521)  sodium chloride 0.9 % bolus 1,000 mL (1,000 mLs Intravenous New Bag/Given 02/26/21 1416)     IMPRESSION / MDM / ASSESSMENT AND PLAN / ED COURSE  I reviewed the triage vital signs and the nursing notes.  Differential diagnosis includes, but is not limited to, diabetes, dental abscess, MRSA, UTI  Reviewed the patient's chart from yesterday, labs show elevated glucose of 431, increased kidney functions of BUN of 26 and creatinine 1.23 which is greatly increased from November 2022 where her BUN was 8 and her creatinine was 0.61  Due to the concerns of AKI and diabetes new onset, I did repeat lab work today  Labs are reassuring today.  They are more in line with her regular labs. CBC, metabolic panel, CBG were normal.  However patient's urinalysis does show large amount of infection, she has large amount of leuks, greater than 50 WBCs white blood cell clumps also noted.  Due to the large dental abscess along with multiple sores from her previous IV drug use we will give her clindamycin here in the ED.  Clindamycin 600 mg IV was ordered.  Patient be given doxycycline 100 twice daily for 14 days at discharge.  I do feel with the amount of infection she has it may take a longer dose in 1 week.  She is in agreement treatment  plan.  Return if worsening.  Discharged stable condition.      FINAL CLINICAL IMPRESSION(S) / ED DIAGNOSES   Final diagnoses:  Dental abscess  Acute UTI     Rx / DC Orders   ED Discharge Orders          Ordered    doxycycline (DORYX) 100 MG EC tablet  2 times daily        02/26/21 1522             Note:  This document was prepared using Dragon voice recognition software and may include unintentional dictation errors.    Faythe Ghee, PA-C 02/26/21 1525    Jene Every, MD 02/26/21 [phone]

## 2021-02-27 ENCOUNTER — Other Ambulatory Visit: Payer: Self-pay

## 2021-02-28 LAB — URINE CULTURE

## 2021-08-05 ENCOUNTER — Other Ambulatory Visit: Payer: Self-pay

## 2021-08-05 ENCOUNTER — Encounter: Payer: Self-pay | Admitting: Emergency Medicine

## 2021-08-05 ENCOUNTER — Inpatient Hospital Stay
Admission: EM | Admit: 2021-08-05 | Discharge: 2021-08-27 | DRG: 853 | Discharge status: Against Medical Advice | Payer: Self-pay | Attending: Internal Medicine | Admitting: Internal Medicine

## 2021-08-05 ENCOUNTER — Emergency Department: Payer: Self-pay

## 2021-08-05 DIAGNOSIS — M5136 Other intervertebral disc degeneration, lumbar region: Secondary | ICD-10-CM | POA: Diagnosis present

## 2021-08-05 DIAGNOSIS — W2203XA Walked into furniture, initial encounter: Secondary | ICD-10-CM | POA: Diagnosis present

## 2021-08-05 DIAGNOSIS — F419 Anxiety disorder, unspecified: Secondary | ICD-10-CM | POA: Diagnosis not present

## 2021-08-05 DIAGNOSIS — B9562 Methicillin resistant Staphylococcus aureus infection as the cause of diseases classified elsewhere: Secondary | ICD-10-CM | POA: Diagnosis present

## 2021-08-05 DIAGNOSIS — G061 Intraspinal abscess and granuloma: Secondary | ICD-10-CM | POA: Diagnosis not present

## 2021-08-05 DIAGNOSIS — F1120 Opioid dependence, uncomplicated: Secondary | ICD-10-CM | POA: Diagnosis present

## 2021-08-05 DIAGNOSIS — R7881 Bacteremia: Principal | ICD-10-CM | POA: Diagnosis present

## 2021-08-05 DIAGNOSIS — G8929 Other chronic pain: Secondary | ICD-10-CM | POA: Diagnosis present

## 2021-08-05 DIAGNOSIS — M5137 Other intervertebral disc degeneration, lumbosacral region: Secondary | ICD-10-CM | POA: Diagnosis present

## 2021-08-05 DIAGNOSIS — M4626 Osteomyelitis of vertebra, lumbar region: Secondary | ICD-10-CM | POA: Diagnosis not present

## 2021-08-05 DIAGNOSIS — G928 Other toxic encephalopathy: Secondary | ICD-10-CM | POA: Diagnosis present

## 2021-08-05 DIAGNOSIS — E876 Hypokalemia: Secondary | ICD-10-CM

## 2021-08-05 DIAGNOSIS — F17210 Nicotine dependence, cigarettes, uncomplicated: Secondary | ICD-10-CM | POA: Diagnosis present

## 2021-08-05 DIAGNOSIS — M4647 Discitis, unspecified, lumbosacral region: Secondary | ICD-10-CM | POA: Diagnosis present

## 2021-08-05 DIAGNOSIS — M549 Dorsalgia, unspecified: Secondary | ICD-10-CM | POA: Diagnosis present

## 2021-08-05 DIAGNOSIS — Z981 Arthrodesis status: Secondary | ICD-10-CM

## 2021-08-05 DIAGNOSIS — F1123 Opioid dependence with withdrawal: Secondary | ICD-10-CM | POA: Diagnosis present

## 2021-08-05 DIAGNOSIS — R404 Transient alteration of awareness: Secondary | ICD-10-CM

## 2021-08-05 DIAGNOSIS — F1110 Opioid abuse, uncomplicated: Secondary | ICD-10-CM

## 2021-08-05 DIAGNOSIS — R768 Other specified abnormal immunological findings in serum: Secondary | ICD-10-CM

## 2021-08-05 DIAGNOSIS — N179 Acute kidney failure, unspecified: Secondary | ICD-10-CM | POA: Diagnosis not present

## 2021-08-05 DIAGNOSIS — M419 Scoliosis, unspecified: Secondary | ICD-10-CM | POA: Diagnosis present

## 2021-08-05 DIAGNOSIS — M5124 Other intervertebral disc displacement, thoracic region: Secondary | ICD-10-CM | POA: Diagnosis present

## 2021-08-05 DIAGNOSIS — Z5329 Procedure and treatment not carried out because of patient's decision for other reasons: Secondary | ICD-10-CM | POA: Diagnosis not present

## 2021-08-05 DIAGNOSIS — I4891 Unspecified atrial fibrillation: Secondary | ICD-10-CM | POA: Diagnosis present

## 2021-08-05 DIAGNOSIS — R4182 Altered mental status, unspecified: Secondary | ICD-10-CM | POA: Diagnosis present

## 2021-08-05 DIAGNOSIS — J9601 Acute respiratory failure with hypoxia: Secondary | ICD-10-CM | POA: Diagnosis present

## 2021-08-05 DIAGNOSIS — S7002XA Contusion of left hip, initial encounter: Secondary | ICD-10-CM | POA: Diagnosis present

## 2021-08-05 LAB — HIV ANTIBODY (ROUTINE TESTING W REFLEX): HIV Screen 4th Generation wRfx: NONREACTIVE

## 2021-08-05 LAB — COMPREHENSIVE METABOLIC PANEL
ALT: 24 U/L (ref 0–44)
AST: 33 U/L (ref 15–41)
Albumin: 3.8 g/dL (ref 3.5–5.0)
Alkaline Phosphatase: 96 U/L (ref 38–126)
Anion gap: 11 (ref 5–15)
BUN: 11 mg/dL (ref 6–20)
CO2: 25 mmol/L (ref 22–32)
Calcium: 9.1 mg/dL (ref 8.9–10.3)
Chloride: 101 mmol/L (ref 98–111)
Creatinine, Ser: 0.66 mg/dL (ref 0.44–1.00)
GFR, Estimated: >60 mL/min (ref >60)
Glucose, Bld: 129 mg/dL — ABNORMAL HIGH (ref 70–99)
Potassium: 3.4 mmol/L — ABNORMAL LOW (ref 3.5–5.1)
Sodium: 137 mmol/L (ref 135–145)
Total Bilirubin: 0.8 mg/dL (ref 0.3–1.2)
Total Protein: 7.5 g/dL (ref 6.5–8.1)

## 2021-08-05 LAB — SEDIMENTATION RATE: Sed Rate: 22 mm/hr (ref 0–22)

## 2021-08-05 LAB — D-DIMER, QUANTITATIVE: D-Dimer, Quant: 2.02 ug/mL-FEU — ABNORMAL HIGH (ref 0.00–0.50)

## 2021-08-05 LAB — CBC WITH DIFFERENTIAL/PLATELET
Abs Immature Granulocytes: 0.03 10*3/uL (ref 0.00–0.07)
Basophils Absolute: 0 10*3/uL (ref 0.0–0.1)
Basophils Relative: 0 %
Eosinophils Absolute: 0 10*3/uL (ref 0.0–0.5)
Eosinophils Relative: 0 %
HCT: 36.8 % (ref 36.0–46.0)
Hemoglobin: 11.8 g/dL — ABNORMAL LOW (ref 12.0–15.0)
Immature Granulocytes: 0 %
Lymphocytes Relative: 13 %
Lymphs Abs: 1 10*3/uL (ref 0.7–4.0)
MCH: 25.7 pg — ABNORMAL LOW (ref 26.0–34.0)
MCHC: 32.1 g/dL (ref 30.0–36.0)
MCV: 80.2 fL (ref 80.0–100.0)
Monocytes Absolute: 0.6 10*3/uL (ref 0.1–1.0)
Monocytes Relative: 7 %
Neutro Abs: 6.1 10*3/uL (ref 1.7–7.7)
Neutrophils Relative %: 80 %
Platelets: 157 10*3/uL (ref 150–400)
RBC: 4.59 MIL/uL (ref 3.87–5.11)
RDW: 14.1 % (ref 11.5–15.5)
WBC: 7.8 10*3/uL (ref 4.0–10.5)
nRBC: 0 % (ref 0.0–0.2)

## 2021-08-05 LAB — HEPATITIS PANEL, ACUTE
HCV Ab: REACTIVE — AB
Hep A IgM: NONREACTIVE
Hep B C IgM: NONREACTIVE
Hepatitis B Surface Ag: NONREACTIVE

## 2021-08-05 LAB — APTT: aPTT: 20 seconds — ABNORMAL LOW (ref 24–36)

## 2021-08-05 LAB — LACTIC ACID, PLASMA
Lactic Acid, Venous: 1.7 mmol/L (ref 0.5–1.9)
Lactic Acid, Venous: 0.8 mmol/L (ref 0.5–1.9)

## 2021-08-05 LAB — PROCALCITONIN: Procalcitonin: 0.1 ng/mL

## 2021-08-05 LAB — PROTIME-INR
INR: 1 (ref 0.8–1.2)
Prothrombin Time: 12.6 seconds (ref 11.4–15.2)

## 2021-08-05 LAB — C-REACTIVE PROTEIN: CRP: 13.4 mg/dL — ABNORMAL HIGH (ref <1.0)

## 2021-08-05 LAB — MAGNESIUM: Magnesium: 1.7 mg/dL (ref 1.7–2.4)

## 2021-08-05 LAB — HCG, QUANTITATIVE, PREGNANCY: hCG, Beta Chain, Quant, S: <1 m[IU]/mL (ref <5)

## 2021-08-05 MED ORDER — ACETAMINOPHEN 325 MG PO TABS
650.0000 mg | ORAL_TABLET | Freq: Four times a day (QID) | ORAL | Status: DC | PRN
  Administered 2021-08-05 – 2021-08-26 (×17): 650 mg via ORAL
  Filled 2021-08-05 (×17): qty 2

## 2021-08-05 MED ORDER — GADOBUTROL 1 MMOL/ML IV SOLN
7.0000 mL | Freq: Once | INTRAVENOUS | Status: AC | PRN
  Administered 2021-08-05: 7 mL via INTRAVENOUS

## 2021-08-05 MED ORDER — FENTANYL CITRATE PF 50 MCG/ML IJ SOSY
100.0000 ug | PREFILLED_SYRINGE | Freq: Once | INTRAMUSCULAR | Status: AC
Start: 2021-08-05 — End: 2021-08-05
  Administered 2021-08-05: 100 ug via NASAL
  Filled 2021-08-05: qty 2

## 2021-08-05 MED ORDER — TRAZODONE HCL 50 MG PO TABS
25.0000 mg | ORAL_TABLET | Freq: Every evening | ORAL | Status: DC | PRN
  Administered 2021-08-08 – 2021-08-26 (×19): 25 mg via ORAL
  Filled 2021-08-05 (×19): qty 1

## 2021-08-05 MED ORDER — ONDANSETRON HCL 4 MG/2ML IJ SOLN
4.0000 mg | Freq: Four times a day (QID) | INTRAMUSCULAR | Status: DC | PRN
  Administered 2021-08-09 – 2021-08-13 (×4): 4 mg via INTRAVENOUS
  Filled 2021-08-05 (×5): qty 2

## 2021-08-05 MED ORDER — ACETAMINOPHEN 650 MG RE SUPP: 650.0000 mg | Freq: Four times a day (QID) | RECTAL | Status: DC | PRN

## 2021-08-05 MED ORDER — HALOPERIDOL LACTATE 5 MG/ML IJ SOLN
2.0000 mg | Freq: Once | INTRAMUSCULAR | Status: DC
  Filled 2021-08-05: qty 1

## 2021-08-05 MED ORDER — POTASSIUM CHLORIDE 10 MEQ/100ML IV SOLN
10.0000 meq | Freq: Once | INTRAVENOUS | Status: AC
  Administered 2021-08-05: 10 meq via INTRAVENOUS
  Filled 2021-08-05: qty 100

## 2021-08-05 MED ORDER — CLONIDINE HCL 0.1 MG PO TABS
0.1000 mg | ORAL_TABLET | Freq: Once | ORAL | Status: AC
  Administered 2021-08-05: 0.1 mg via ORAL
  Filled 2021-08-05: qty 1

## 2021-08-05 MED ORDER — ONDANSETRON HCL 4 MG PO TABS: 4.0000 mg | ORAL_TABLET | Freq: Four times a day (QID) | ORAL | Status: DC | PRN

## 2021-08-05 MED ORDER — KETOROLAC TROMETHAMINE 15 MG/ML IJ SOLN
15.0000 mg | Freq: Once | INTRAMUSCULAR | Status: AC
  Administered 2021-08-05: 15 mg via INTRAVENOUS
  Filled 2021-08-05: qty 1

## 2021-08-05 MED ORDER — MAGNESIUM SULFATE 2 GM/50ML IV SOLN
2.0000 g | Freq: Once | INTRAVENOUS | Status: AC
  Administered 2021-08-05: 2 g via INTRAVENOUS
  Filled 2021-08-05: qty 50

## 2021-08-05 MED ORDER — ENOXAPARIN SODIUM 40 MG/0.4ML IJ SOSY
40.0000 mg | PREFILLED_SYRINGE | INTRAMUSCULAR | Status: DC
  Administered 2021-08-06 – 2021-08-08 (×3): 40 mg via SUBCUTANEOUS
  Filled 2021-08-05 (×17): qty 0.4

## 2021-08-05 MED ORDER — POTASSIUM CHLORIDE IN NACL 20-0.9 MEQ/L-% IV SOLN
INTRAVENOUS | Status: DC
  Filled 2021-08-05 (×5): qty 1000

## 2021-08-05 MED ORDER — HALOPERIDOL LACTATE 5 MG/ML IJ SOLN
5.0000 mg | Freq: Once | INTRAMUSCULAR | Status: AC
  Administered 2021-08-05: 5 mg via INTRAMUSCULAR

## 2021-08-05 MED ORDER — MAGNESIUM HYDROXIDE 400 MG/5ML PO SUSP: 30.0000 mL | Freq: Every day | ORAL | Status: DC | PRN

## 2021-08-05 MED ORDER — LACTATED RINGERS IV BOLUS
1000.0000 mL | Freq: Once | INTRAVENOUS | Status: AC
  Administered 2021-08-05: 1000 mL via INTRAVENOUS

## 2021-08-05 MED ORDER — THIAMINE HCL 100 MG PO TABS
100.0000 mg | ORAL_TABLET | Freq: Every day | ORAL | Status: DC
Start: 2021-08-05 — End: 2021-08-28
  Administered 2021-08-05 – 2021-08-27 (×23): 100 mg via ORAL
  Filled 2021-08-05 (×24): qty 1

## 2021-08-05 NOTE — ED Provider Notes (Signed)
Procedures     ----------------------------------------- 9:52 PM on 08/05/2021 -----------------------------------------  Patient still delirious.  Also mildly hypoxic with a sat of 85 % on room air with ambulation.  She is requiring 2 L nasal cannula.  Case discussed with hospitalist for overnight observation.    Sharman Cheek, MD 08/05/21 2152

## 2021-08-05 NOTE — ED Provider Notes (Signed)
[facility] Provider Note    Event Date/Time   First MD Initiated Contact with Patient 08/05/21 1045     (approximate)   History   Back Pain and Drug Overdose   HPI  Susan Terry is a 46 y.o. female with past medical history of cervical osteomyelitis and IV heroin abuse who presents via EMS for evaluation of back pain.  It seems that started sometime yesterday or early last night.  Patient states that she thinks her osteomyelitis may be back although she states the pain is more in the middle of her back not her neck.  She does states she walked into a table yesterday and some bruising on the left hip.  She has been taking Percocet at home and using IV heroin.  In route she did become apneic and hypoxic and was given 1.2 mg of Narcan.  On arrival she states she is not agonizing pain from acute withdrawal.  She denies any recent injuries or falls or other illicit drug use aside from IV heroin which she last used sometime earlier this morning or last night.  She denies any fevers, cough, chest pain, vomiting or diarrhea.      Past Medical History:  Diagnosis Date   Drug abuse ([employer])    Osteomyelitis [facility])      Physical Exam  Triage Vital Signs: ED Triage Vitals  Enc Vitals Group     BP      Pulse      Resp      Temp      Temp src      SpO2      Weight      Height      Head Circumference      Peak Flow      Pain Score      Pain Loc      Pain Edu?      Excl. in [place]?     Most recent vital signs: Vitals:   08/05/21 1430 08/05/21 1500  BP: (!) 147/72 140/68  Pulse: 91 81  Resp: (!) 22 (!) 23  Temp:    SpO2: 91% 100%    General: Patient is screaming writhing back-and-forth on the stretcher grinding her jaw and arching her back. CV:  2+ radial pulse.  Tachycardic. Resp:  Normal effort.  Clear bilaterally. Abd:  No distention.  Soft. Other:  Clear rash on the back of the patient seems fairly diffusely tender.  He is moving all extremities  spontaneously although was not able to otherwise participate in exam secondary to pain and distress.  He most of the left hip.  Scattered track marks of the arms.   ED Results / Procedures / Treatments  Labs (all labs ordered are listed, but only abnormal results are displayed) Labs Reviewed  COMPREHENSIVE METABOLIC PANEL - Abnormal; Notable for the following components:      Result Value   Potassium 3.4 (*)    Glucose, Bld 129 (*)    All other components within normal limits  CBC WITH DIFFERENTIAL/PLATELET - Abnormal; Notable for the following components:   Hemoglobin 11.8 (*)    MCH 25.7 (*)    All other components within normal limits  APTT - Abnormal; Notable for the following components:   aPTT 20 (*)    All other components within normal limits  CULTURE, BLOOD (ROUTINE X 2)  CULTURE, BLOOD (ROUTINE X 2)  URINE CULTURE  LACTIC ACID, PLASMA  HCG, QUANTITATIVE, PREGNANCY  PROCALCITONIN  SEDIMENTATION RATE  MAGNESIUM  PROTIME-INR  LACTIC ACID, PLASMA  URINALYSIS, COMPLETE (UACMP) WITH MICROSCOPIC  HIV ANTIBODY (ROUTINE TESTING W REFLEX)  URINE DRUG SCREEN, QUALITATIVE ([facility] ONLY)  C-REACTIVE PROTEIN  HEPATITIS PANEL, ACUTE     EKG  ECG is remarkable for what appears to be a fair amount of artifact with essentially not interpretable leads in leads I, aVR, aVL, lead II and lead III with otherwise appears to be sinus rhythm with a prolonged QTc interval at 506 without other clear evidence of acute ischemia aside from some nonspecific changes in V2.    RADIOLOGY   MR C-spine, T-spine and L-spine my interpretation without evidence of abscess, hematoma or clear osteomyelitis. I do not see any large fracture.  I reviewed radiology's interpretation and agree with the findings of suspected right facet joint effusion possibly due to degenerative changes and some spurring with some mild edema and sequelae of remote C5-C6 osteomyelitis as well as small disc protrusion in the  thoracic spine and mild disc fluid L4-L5 and some evidence of degenerative changes and scoliosis but no evidence anywhere in the spine of an acute osteomyelitis, abscess or discitis.   PROCEDURES:  Critical Care performed: No  .1-3 Lead EKG Interpretation  Performed by: Lucrezia Starch, MD Authorized by: Lucrezia Starch, MD     Interpretation: normal     ECG rate assessment: normal     Rhythm: sinus rhythm     Ectopy: none     Conduction: normal     The patient is on the cardiac monitor to evaluate for evidence of arrhythmia and/or significant heart rate changes.   MEDICATIONS ORDERED IN ED: Medications  fentaNYL (SUBLIMAZE) injection 100 mcg (100 mcg Nasal Given 08/05/21 1103)  haloperidol lactate (HALDOL) injection 5 mg (5 mg Intramuscular Given 08/05/21 1108)  potassium chloride 10 mEq in 100 mL IVPB (0 mEq Intravenous Stopped 08/05/21 1442)  magnesium sulfate IVPB 2 g 50 mL (0 g Intravenous Stopped 08/05/21 1442)  gadobutrol (GADAVIST) 1 MMOL/ML injection 7 mL (7 mLs Intravenous Contrast Given 08/05/21 1327)  ketorolac (TORADOL) 15 MG/ML injection 15 mg (15 mg Intravenous Given 08/05/21 1342)  cloNIDine (CATAPRES) tablet 0.1 mg (0.1 mg Oral Given 08/05/21 1343)  lactated ringers bolus 1,000 mL (1,000 mLs Intravenous New Bag/Given 08/05/21 1343)     IMPRESSION / MDM / ASSESSMENT AND PLAN / ED COURSE  I reviewed the triage vital signs and the nursing notes. Patient's presentation is most consistent with acute presentation with potential threat to life or bodily function.                               Patient presents with what appears to be acute opioid withdrawal precipitated by Narcan in route given secondary to hypoxia and apnea in the setting of IV and oral opioid abuse.  On arrival patient is in a fair amount of distress crying out denies any.  Discussed that I strongly recommend a trial of Suboxone although she is refusing this so we will try some intranasal fentanyl  obtaining IV access.  She is fairly tender around her whole back given she has a history of osteomyelitis and ongoing IV drug use she is at very high risk for recurrence as well as abscess.  She cannot fully participate in exam process for subtle weakness of is able to move her arms or legs at this time.  ECG is remarkable for  what appears to be a fair amount of artifact with essentially not interpretable leads in leads I, aVR, aVL, lead II and lead III with otherwise appears to be sinus rhythm with a prolonged QTc interval at 506 without other clear evidence of acute ischemia aside from some nonspecific changes in V2.  CMP is remarkable for K of 3.4 without any other significant electrolyte or metabolic derangements.  CBC without leukocytosis and hemoglobin of 11.8 compared to 10.85 months ago with normal platelets.  hCG is negative.  Procalcitonin is 0.1.  ESR is 22.  INR 1.0 PTT 20.  Magnesium 1.7.  We will give some supplemental magnesium and potassium given patient's prolonged QTc interval.  MR C-spine, T-spine and L-spine my interpretation without evidence of abscess, hematoma or clear osteomyelitis. I do not see any large fracture.  I reviewed radiology's interpretation and agree with the findings of suspected right facet joint effusion possibly due to degenerative changes and some spurring with some mild edema and sequelae of remote C5-C6 osteomyelitis as well as small disc protrusion in the thoracic spine and mild disc fluid L4-L5 and some evidence of degenerative changes and scoliosis but no evidence anywhere in the spine of an acute osteomyelitis, abscess or discitis.     Care patient signed over to assuming provider at approximately 1530 with plan to follow-up urine and reassess patient.  She is still somnolent after receiving some Haldol and fentanyl on arrival given to adequately evaluate her.  When she is clinically sober if her UA is reassuring she can likely be safely discharged with  outpatient follow-up. FINAL CLINICAL IMPRESSION(S) / ED DIAGNOSES   Final diagnoses:  Acute midline back pain, unspecified back location  Opioid abuse ([place])  Hypokalemia     Rx / DC Orders   ED Discharge Orders     None        Note:  This document was prepared using Dragon voice recognition software and may include unintentional dictation errors.   Lucrezia Starch, MD 08/05/21 [phone]

## 2021-08-05 NOTE — ED Notes (Signed)
Patient transported to MRI 

## 2021-08-05 NOTE — ED Notes (Signed)
Pt returned to room from MRI.

## 2021-08-05 NOTE — H&P (Signed)
[facility]   PATIENT NAME: Susan Terry    MR#:  [id number]  DATE OF BIRTH:  1975-03-20  DATE OF ADMISSION:  08/05/2021  PRIMARY CARE PHYSICIAN: Pcp, No   Patient is coming from: Home  REQUESTING/REFERRING PHYSICIAN: Alfonse Flavors, MD  CHIEF COMPLAINT:   Chief Complaint  Patient presents with   Back Pain   Drug Overdose    HISTORY OF PRESENT ILLNESS:  Shonita Rinck is a 46 y.o. female with medical history significant for cervical osteomyelitis and IV heroin abuse, who presented to the emergency room with acute onset of low back pain that started yesterday morning her mid back.  She apparently walked into a table yesterday with subsequent bruising to her left hip.  She has been taking Percocet at home and has been using IV heroin.  On route to the hospital she became hypoxic and apneic and was given 1.2 mg Narcan.  Upon arrival to the hospital she was in acute withdrawal with agitation and psychotic behavior.  She had no recent injuries or falls or other illicit drug use.  No fever or chills.  No nausea or vomiting or diarrhea or abdominal pain.  No dysuria, oliguria or hematuria or flank pain.  She was fairly somnolent but arousable.  ED Course: When she had her ER BP was 158/77 with respiratory rate of 23 and pulse symmetry was 89% on room air and later 100% on 2 L of O2 by nasal cannula with otherwise normal vital signs.  Labs revealed mild hypokalemia of 3.4 with otherwise unremarkable CMP.  CRP of 13.4 likely as 1.7 and later 0.8, with procalcitonin 0.1.  Magnesium was 1.7 CBC showed hemoglobin 11.8 hematocrit 36.8 and was otherwise unremarkable.  D-dimer was 2.02 and coagulation profile was within normal. EKG as reviewed by me : EKG showed atrial fibrillation with rate of 94 with borderline prolonged QT interval. Imaging: C-spine MRI showed the following: 1. Precontrast only due to patient condition. No suspected acute infection. There is a right facet joint  effusion at C6-7, but this is likely degenerative given the degree of spurring and mild associated marrow edema. 2. Sequela of remote C5-6 discitis osteomyelitis with intervertebral ankylosis.   Thoracic spine MRI:   1. No acute finding. 2. Small disc protrusions as described.   Lumbar spine MRI:   1. No suspected infection. There is mild discal fluid at L4-5 but no associated findings of discitis. 2. Lumbar spine degeneration and scoliosis with moderate bilateral foraminal narrowing at L5-S1.  The patient was given 100 mcg of IV fentanyl, 5 mg of IV Haldol, 1 L bolus of IV lactated Ringer and 2 g of IV magnesium sulfate, 10 mill Cabbell and IV potassium chloride and 0.1 mg p.o. clonidine and 15 mg of IV Toradol.  She will be admitted to AN observation medical telemetry bed for further evaluation and management.   PAST MEDICAL HISTORY:   Past Medical History:  Diagnosis Date   Drug abuse (HCC)    Osteomyelitis (HCC)   IV heroin abuse and cervical osteomyelitis  PAST SURGICAL HISTORY:   Past Surgical History:  Procedure Laterality Date   BACK SURGERY     INCISION AND DRAINAGE FOREARM / WRIST DEEP Right     SOCIAL HISTORY:   Social History   Tobacco Use   Smoking status: Every Day    Packs/day: 0.50    Types: Cigarettes   Smokeless tobacco: Never  Substance Use Topics   Alcohol use:  No    FAMILY HISTORY:   Family History  Problem Relation Age of Onset   Cancer Mother        cancer throughout family    DRUG ALLERGIES:  No Known Allergies  REVIEW OF SYSTEMS:   ROS As per history of present illness. All pertinent systems were reviewed above. Constitutional, HEENT, cardiovascular, respiratory, GI, GU, musculoskeletal, neuro, psychiatric, endocrine, integumentary and hematologic systems were reviewed and are otherwise negative/unremarkable except for positive findings mentioned above in the HPI.   MEDICATIONS AT HOME:   Prior to Admission medications    Medication Sig Start Date End Date Taking? Authorizing Provider  buprenorphine-naloxone (SUBOXONE) 8-2 mg SUBL SL tablet Place 1 tablet under the tongue 2 (two) times daily. Patient not taking: Reported on 08/05/2021 04/17/20   Alberteen Sam, MD  linezolid (ZYVOX) 600 MG tablet TAKE ONE TABLET BY MOUTH ONCE EVERY 12 HOURS FOR 7 DAYS. Patient not taking: Reported on 08/05/2021 12/12/20   Shaune Pollack, MD  thiamine 100 MG tablet Take 1 tablet (100 mg total) by mouth daily. Patient not taking: Reported on 08/05/2021 04/18/20   Alford Highland, MD      VITAL SIGNS:  Blood pressure (!) 152/93, pulse 100, temperature 100.1 F (37.8 C), temperature source Oral, resp. rate 18, weight 73 kg, SpO2 96 %.  PHYSICAL EXAMINATION:  Physical Exam  GENERAL:  46 y.o.-year-old Caucasian female patient lying in the bed in no respiratory distress.  She was somnolent but arousable and seem to be in mild distress from pain. EYES: Pupils equal, round, reactive to light and accommodation. No scleral icterus. Extraocular muscles intact.  HEENT: Head atraumatic, normocephalic. Oropharynx and nasopharynx clear.  NECK:  Supple, no jugular venous distention. No thyroid enlargement, no tenderness.  LUNGS: Normal breath sounds bilaterally, no wheezing, rales,rhonchi or crepitation. No use of accessory muscles of respiration.  CARDIOVASCULAR: Regular rate and rhythm, S1, S2 normal. No murmurs, rubs, or gallops.  ABDOMEN: Soft, nondistended, nontender. Bowel sounds present. No organomegaly or mass.  EXTREMITIES: No pedal edema, cyanosis, or clubbing.  NEUROLOGIC: Cranial nerves II through XII are intact. Muscle strength 5/5 in all extremities. Sensation intact. Gait not checked.  PSYCHIATRIC: The patient is alert and oriented x 3.  Normal affect and good eye contact. SKIN: Abrasions over her chin and both sides of the face. LABORATORY PANEL:   CBC Recent Labs  Lab 08/05/21 1126  WBC 7.8  HGB 11.8*  HCT  36.8  PLT 157   ------------------------------------------------------------------------------------------------------------------  Chemistries  Recent Labs  Lab 08/05/21 1126 08/05/21 1155  NA 137  --   K 3.4*  --   CL 101  --   CO2 25  --   GLUCOSE 129*  --   BUN 11  --   CREATININE 0.66  --   CALCIUM 9.1  --   MG  --  1.7  AST 33  --   ALT 24  --   ALKPHOS 96  --   BILITOT 0.8  --    ------------------------------------------------------------------------------------------------------------------  Cardiac Enzymes No results for input(s): "TROPONINI" in the last 168 hours. ------------------------------------------------------------------------------------------------------------------  RADIOLOGY:  MR THORACIC SPINE W WO CONTRAST  Result Date: 08/05/2021 CLINICAL DATA:  Osteomyelitis, cervical. EXAM: MRI THORACIC AND LUMBAR WITHOUT AND WITH CONTRAST MRI CERVICAL SPINE WITHOUT CONTRAST TECHNIQUE: Multisequence MR imaging of the thoracic and lumbar was performed prior to and following IV contrast administration for evaluation of spinal metastatic disease. Pre and postcontrast imaging of the cervical spine was  planned but the patient did not tolerate completion of the cervical spine portion which was only obtained without contrast. CONTRAST:  58mL GADAVIST GADOBUTROL 1 MMOL/ML IV SOLN COMPARISON:  Cervical MRI 04/01/2020 FINDINGS: MRI CERVICAL SPINE FINDINGS Alignment: Reversal of cervical lordosis with mild C3-4 and C4-5 anterolisthesis Vertebrae: Intervertebral ankylosis at C5-6 where there was prior osteomyelitis. No evidence of fracture, active discitis, or aggressive bone lesion. C6-7 right facet joint fluid and mild marrow edema where there is prominent degenerative spurring. Cord: Normal signal and morphology. No evidence of spinal canal collection Posterior Fossa, vertebral arteries, paraspinal tissues: No evidence of paravertebral edema or mass Disc levels: C2-3: Degenerative  facet spurring on the right more than left. Mild right foraminal narrowing C3-4: Degenerative facet spurring which is greater on the left. Bilateral uncovertebral spurring. Left foraminal impingement C4-5: Disc narrowing and bulging with uncovertebral ridging. Facet osteoarthritis with bulky spurring asymmetric to the left. Moderate left foraminal stenosis C5-6: Intervertebral ankylosis. Disc space narrowing crowds the foramina but canal and foraminal patency seen on both sides C6-7: Facet osteoarthritis with spurring and joint effusion on the right where there is mild marrow edema. Disc is narrowed and bulging with partial CSF effacement. Patent foramina with mild narrowing bilaterally. C7-T1:Degenerative facet spurring.  No neural impingement MRI THORACIC SPINE FINDINGS Alignment:  Mild scoliosis Vertebrae: No fracture, evidence of discitis, or bone lesion. Mildly accentuated epidural venous plexus enhancement at the level of disc herniations, but no infection suspected. Cord:  Normal signal and morphology. Paraspinal and other soft tissues: Negative. Disc levels: Left paracentral protrusions at T6-7 to T8-9. Small bilateral paracentral protrusions at T2-3 to T5-6. No significant facet spurring. No neural impingement. MRI LUMBAR SPINE FINDINGS Segmentation:  Rudimentary disc space at S1-2. Alignment:  Mild scoliosis Vertebrae: Mild disc T2 hyperintensity at L4-5, but no marrow edema or destruction of adjacent endplates. L2-3 posterior fusion hardware. No acute fracture. No aggressive bone lesion. Conus medullaris: Extends to the L1 level and appears normal. Paraspinal and other soft tissues: Negative for perispinal mass or inflammation. Disc levels: T12- L1: Unremarkable. L1-L2: Unremarkable. L2-L3: Disc collapse with posterior fusion.  No neural impingement L3-L4: Mild disc bulging and facet spurring L4-L5: Disc bulging and mild endplate degeneration with endplate and facet spurring. Mild triangular narrowing the  thecal sac L5-S1:The disc is narrowed and bulging. Degenerative facet spurring on both sides. Moderate bilateral foraminal stenosis. IMPRESSION: Cervical spine: 1. Precontrast only due to patient condition. No suspected acute infection. There is a right facet joint effusion at C6-7, but this is likely degenerative given the degree of spurring and mild associated marrow edema. 2. Sequela of remote C5-6 discitis osteomyelitis with intervertebral ankylosis. Thoracic spine: 1. No acute finding. 2. Small disc protrusions as described. Lumbar spine: 1. No suspected infection. There is mild discal fluid at L4-5 but no associated findings of discitis. 2. Lumbar spine degeneration and scoliosis with moderate bilateral foraminal narrowing at L5-S1. Electronically Signed   By: Tiburcio Pea M.D.   On: 08/05/2021 13:46   MR Lumbar Spine W Wo Contrast  Result Date: 08/05/2021 CLINICAL DATA:  Osteomyelitis, cervical. EXAM: MRI THORACIC AND LUMBAR WITHOUT AND WITH CONTRAST MRI CERVICAL SPINE WITHOUT CONTRAST TECHNIQUE: Multisequence MR imaging of the thoracic and lumbar was performed prior to and following IV contrast administration for evaluation of spinal metastatic disease. Pre and postcontrast imaging of the cervical spine was planned but the patient did not tolerate completion of the cervical spine portion which was only obtained without contrast. CONTRAST:  7mL GADAVIST GADOBUTROL 1 MMOL/ML IV SOLN COMPARISON:  Cervical MRI 04/01/2020 FINDINGS: MRI CERVICAL SPINE FINDINGS Alignment: Reversal of cervical lordosis with mild C3-4 and C4-5 anterolisthesis Vertebrae: Intervertebral ankylosis at C5-6 where there was prior osteomyelitis. No evidence of fracture, active discitis, or aggressive bone lesion. C6-7 right facet joint fluid and mild marrow edema where there is prominent degenerative spurring. Cord: Normal signal and morphology. No evidence of spinal canal collection Posterior Fossa, vertebral arteries, paraspinal  tissues: No evidence of paravertebral edema or mass Disc levels: C2-3: Degenerative facet spurring on the right more than left. Mild right foraminal narrowing C3-4: Degenerative facet spurring which is greater on the left. Bilateral uncovertebral spurring. Left foraminal impingement C4-5: Disc narrowing and bulging with uncovertebral ridging. Facet osteoarthritis with bulky spurring asymmetric to the left. Moderate left foraminal stenosis C5-6: Intervertebral ankylosis. Disc space narrowing crowds the foramina but canal and foraminal patency seen on both sides C6-7: Facet osteoarthritis with spurring and joint effusion on the right where there is mild marrow edema. Disc is narrowed and bulging with partial CSF effacement. Patent foramina with mild narrowing bilaterally. C7-T1:Degenerative facet spurring.  No neural impingement MRI THORACIC SPINE FINDINGS Alignment:  Mild scoliosis Vertebrae: No fracture, evidence of discitis, or bone lesion. Mildly accentuated epidural venous plexus enhancement at the level of disc herniations, but no infection suspected. Cord:  Normal signal and morphology. Paraspinal and other soft tissues: Negative. Disc levels: Left paracentral protrusions at T6-7 to T8-9. Small bilateral paracentral protrusions at T2-3 to T5-6. No significant facet spurring. No neural impingement. MRI LUMBAR SPINE FINDINGS Segmentation:  Rudimentary disc space at S1-2. Alignment:  Mild scoliosis Vertebrae: Mild disc T2 hyperintensity at L4-5, but no marrow edema or destruction of adjacent endplates. L2-3 posterior fusion hardware. No acute fracture. No aggressive bone lesion. Conus medullaris: Extends to the L1 level and appears normal. Paraspinal and other soft tissues: Negative for perispinal mass or inflammation. Disc levels: T12- L1: Unremarkable. L1-L2: Unremarkable. L2-L3: Disc collapse with posterior fusion.  No neural impingement L3-L4: Mild disc bulging and facet spurring L4-L5: Disc bulging and mild  endplate degeneration with endplate and facet spurring. Mild triangular narrowing the thecal sac L5-S1:The disc is narrowed and bulging. Degenerative facet spurring on both sides. Moderate bilateral foraminal stenosis. IMPRESSION: Cervical spine: 1. Precontrast only due to patient condition. No suspected acute infection. There is a right facet joint effusion at C6-7, but this is likely degenerative given the degree of spurring and mild associated marrow edema. 2. Sequela of remote C5-6 discitis osteomyelitis with intervertebral ankylosis. Thoracic spine: 1. No acute finding. 2. Small disc protrusions as described. Lumbar spine: 1. No suspected infection. There is mild discal fluid at L4-5 but no associated findings of discitis. 2. Lumbar spine degeneration and scoliosis with moderate bilateral foraminal narrowing at L5-S1. Electronically Signed   By: Tiburcio PeaJonathan  Watts M.D.   On: 08/05/2021 13:46   MR CERVICAL SPINE WO CONTRAST  Result Date: 08/05/2021 CLINICAL DATA:  Osteomyelitis, cervical. EXAM: MRI THORACIC AND LUMBAR WITHOUT AND WITH CONTRAST MRI CERVICAL SPINE WITHOUT CONTRAST TECHNIQUE: Multisequence MR imaging of the thoracic and lumbar was performed prior to and following IV contrast administration for evaluation of spinal metastatic disease. Pre and postcontrast imaging of the cervical spine was planned but the patient did not tolerate completion of the cervical spine portion which was only obtained without contrast. CONTRAST:  7mL GADAVIST GADOBUTROL 1 MMOL/ML IV SOLN COMPARISON:  Cervical MRI 04/01/2020 FINDINGS: MRI CERVICAL SPINE FINDINGS Alignment: Reversal of cervical  lordosis with mild C3-4 and C4-5 anterolisthesis Vertebrae: Intervertebral ankylosis at C5-6 where there was prior osteomyelitis. No evidence of fracture, active discitis, or aggressive bone lesion. C6-7 right facet joint fluid and mild marrow edema where there is prominent degenerative spurring. Cord: Normal signal and morphology. No  evidence of spinal canal collection Posterior Fossa, vertebral arteries, paraspinal tissues: No evidence of paravertebral edema or mass Disc levels: C2-3: Degenerative facet spurring on the right more than left. Mild right foraminal narrowing C3-4: Degenerative facet spurring which is greater on the left. Bilateral uncovertebral spurring. Left foraminal impingement C4-5: Disc narrowing and bulging with uncovertebral ridging. Facet osteoarthritis with bulky spurring asymmetric to the left. Moderate left foraminal stenosis C5-6: Intervertebral ankylosis. Disc space narrowing crowds the foramina but canal and foraminal patency seen on both sides C6-7: Facet osteoarthritis with spurring and joint effusion on the right where there is mild marrow edema. Disc is narrowed and bulging with partial CSF effacement. Patent foramina with mild narrowing bilaterally. C7-T1:Degenerative facet spurring.  No neural impingement MRI THORACIC SPINE FINDINGS Alignment:  Mild scoliosis Vertebrae: No fracture, evidence of discitis, or bone lesion. Mildly accentuated epidural venous plexus enhancement at the level of disc herniations, but no infection suspected. Cord:  Normal signal and morphology. Paraspinal and other soft tissues: Negative. Disc levels: Left paracentral protrusions at T6-7 to T8-9. Small bilateral paracentral protrusions at T2-3 to T5-6. No significant facet spurring. No neural impingement. MRI LUMBAR SPINE FINDINGS Segmentation:  Rudimentary disc space at S1-2. Alignment:  Mild scoliosis Vertebrae: Mild disc T2 hyperintensity at L4-5, but no marrow edema or destruction of adjacent endplates. L2-3 posterior fusion hardware. No acute fracture. No aggressive bone lesion. Conus medullaris: Extends to the L1 level and appears normal. Paraspinal and other soft tissues: Negative for perispinal mass or inflammation. Disc levels: T12- L1: Unremarkable. L1-L2: Unremarkable. L2-L3: Disc collapse with posterior fusion.  No neural  impingement L3-L4: Mild disc bulging and facet spurring L4-L5: Disc bulging and mild endplate degeneration with endplate and facet spurring. Mild triangular narrowing the thecal sac L5-S1:The disc is narrowed and bulging. Degenerative facet spurring on both sides. Moderate bilateral foraminal stenosis. IMPRESSION: Cervical spine: 1. Precontrast only due to patient condition. No suspected acute infection. There is a right facet joint effusion at C6-7, but this is likely degenerative given the degree of spurring and mild associated marrow edema. 2. Sequela of remote C5-6 discitis osteomyelitis with intervertebral ankylosis. Thoracic spine: 1. No acute finding. 2. Small disc protrusions as described. Lumbar spine: 1. No suspected infection. There is mild discal fluid at L4-5 but no associated findings of discitis. 2. Lumbar spine degeneration and scoliosis with moderate bilateral foraminal narrowing at L5-S1. Electronically Signed   By: Tiburcio Pea M.D.   On: 08/05/2021 13:46      IMPRESSION AND PLAN:  Assessment and Plan: * Altered mental status - This likely secondary to hypoxia and opiates abuse. - The patient will be admitted to a medical telemetry observation bed. - We will monitor for opiate withdrawal. - We will follow neurochecks. - We will utilize IV Narcan as needed.  Acute respiratory failure with hypoxia (HCC) - This like secondary to opiates. - O2 protocol will be followed and will withdrawal oxygen as tolerated. - This could be contributing to her altered mental status.  Back pain - We will utilize IV Toradol and cut down on narcotic therapy except for withdrawal.  Hypokalemia Potassium replaced and magnesium level will be optimized.   DVT prophylaxis: Lovenox.  Advanced Care Planning:  Code Status: full code.  Family Communication:  The plan of care was discussed in details with the patient (and family). I answered all questions. The patient agreed to proceed with the  above mentioned plan. Further management will depend upon hospital course. Disposition Plan: Back to previous home environment Consults called: none.  All the records are reviewed and case discussed with ED provider.  Status is: Observation  I certify that at the time of admission, it is my clinical judgment that the patient will require hospital care extending less than 2 midnights.                            Dispo: The patient is from: Home              Anticipated d/c is to: Home              Patient currently is not medically stable to d/c.              Difficult to place patient: No  Hannah Beat M.D on 08/06/2021 at 1:19 AM  [facility]   From 7 PM-7 AM, contact night-coverage [email or web address]  CC: Primary care physician; Pcp, No

## 2021-08-05 NOTE — ED Triage Notes (Signed)
Pt to ED via [facility] from home for back pain. Per EMS pt reports that the pt has hx/o spinal infection. Pt has been taking percocet at home. EMS also reports that pt has hx/o heroine abuse and syringe was found with her. EMS reports that pts VSS stable until right before they got to the hospital and pts SpO2 dropped to 70's and she became apneic. Pt arrives to the ED thrashing on stretcher and yelling out. EDP smith at bedside.

## 2021-08-06 ENCOUNTER — Observation Stay
Admit: 2021-08-06 | Discharge: 2021-08-06 | Disposition: A | Discharge status: Home / Self Care | Payer: Self-pay | Attending: Internal Medicine | Admitting: Internal Medicine

## 2021-08-06 ENCOUNTER — Encounter: Payer: Self-pay | Admitting: Family Medicine

## 2021-08-06 DIAGNOSIS — M549 Dorsalgia, unspecified: Secondary | ICD-10-CM | POA: Diagnosis present

## 2021-08-06 DIAGNOSIS — R410 Disorientation, unspecified: Secondary | ICD-10-CM

## 2021-08-06 DIAGNOSIS — R7881 Bacteremia: Secondary | ICD-10-CM

## 2021-08-06 DIAGNOSIS — B9562 Methicillin resistant Staphylococcus aureus infection as the cause of diseases classified elsewhere: Secondary | ICD-10-CM | POA: Diagnosis present

## 2021-08-06 DIAGNOSIS — J9601 Acute respiratory failure with hypoxia: Secondary | ICD-10-CM | POA: Diagnosis present

## 2021-08-06 DIAGNOSIS — E876 Hypokalemia: Secondary | ICD-10-CM

## 2021-08-06 DIAGNOSIS — M545 Low back pain, unspecified: Secondary | ICD-10-CM

## 2021-08-06 DIAGNOSIS — F1990 Other psychoactive substance use, unspecified, uncomplicated: Secondary | ICD-10-CM

## 2021-08-06 LAB — URINALYSIS, COMPLETE (UACMP) WITH MICROSCOPIC
Bilirubin Urine: NEGATIVE
Glucose, UA: NEGATIVE mg/dL
Hgb urine dipstick: NEGATIVE
Ketones, ur: 80 mg/dL — AB
Leukocytes,Ua: NEGATIVE
Nitrite: NEGATIVE
Protein, ur: NEGATIVE mg/dL
Specific Gravity, Urine: 1.013 (ref 1.005–1.030)
pH: 6 (ref 5.0–8.0)

## 2021-08-06 LAB — BLOOD CULTURE ID PANEL (REFLEXED) - BCID2

## 2021-08-06 LAB — CBC
HCT: 32.8 % — ABNORMAL LOW (ref 36.0–46.0)
Hemoglobin: 10.3 g/dL — ABNORMAL LOW (ref 12.0–15.0)
MCH: 25.4 pg — ABNORMAL LOW (ref 26.0–34.0)
MCHC: 31.4 g/dL (ref 30.0–36.0)
MCV: 80.8 fL (ref 80.0–100.0)
Platelets: 158 10*3/uL (ref 150–400)
RBC: 4.06 MIL/uL (ref 3.87–5.11)
RDW: 14.4 % (ref 11.5–15.5)
WBC: 11.9 10*3/uL — ABNORMAL HIGH (ref 4.0–10.5)
nRBC: 0 % (ref 0.0–0.2)

## 2021-08-06 LAB — URINE DRUG SCREEN, QUALITATIVE (ARMC ONLY)
Amphetamines, Ur Screen: NOT DETECTED
Barbiturates, Ur Screen: NOT DETECTED
Benzodiazepine, Ur Scrn: NOT DETECTED
Cannabinoid 50 Ng, Ur ~~LOC~~: NOT DETECTED
Cocaine Metabolite,Ur ~~LOC~~: POSITIVE — AB
MDMA (Ecstasy)Ur Screen: NOT DETECTED
Methadone Scn, Ur: NOT DETECTED
Opiate, Ur Screen: NOT DETECTED
Phencyclidine (PCP) Ur S: NOT DETECTED
Tricyclic, Ur Screen: NOT DETECTED

## 2021-08-06 LAB — ECHOCARDIOGRAM COMPLETE
AR max vel: 2.03 cm2
AV Area VTI: 1.95 cm2
AV Area mean vel: 1.92 cm2
AV Mean grad: 7 mmHg
AV Peak grad: 10.6 mmHg
Ao pk vel: 1.63 m/s
Area-P 1/2: 4.71 cm2
S' Lateral: 2.5 cm
Weight: 2576 oz

## 2021-08-06 LAB — BASIC METABOLIC PANEL
Anion gap: 10 (ref 5–15)
BUN: 8 mg/dL (ref 6–20)
CO2: 25 mmol/L (ref 22–32)
Calcium: 8.2 mg/dL — ABNORMAL LOW (ref 8.9–10.3)
Chloride: 101 mmol/L (ref 98–111)
Creatinine, Ser: 0.59 mg/dL (ref 0.44–1.00)
GFR, Estimated: >60 mL/min (ref >60)
Glucose, Bld: 104 mg/dL — ABNORMAL HIGH (ref 70–99)
Potassium: 3.7 mmol/L (ref 3.5–5.1)
Sodium: 136 mmol/L (ref 135–145)

## 2021-08-06 LAB — MRSA NEXT GEN BY PCR, NASAL: MRSA by PCR Next Gen: DETECTED — AB

## 2021-08-06 LAB — PROCALCITONIN: Procalcitonin: 0.23 ng/mL

## 2021-08-06 MED ORDER — LIDOCAINE 5 % EX PTCH
1.0000 | MEDICATED_PATCH | CUTANEOUS | Status: DC
  Filled 2021-08-06 (×6): qty 1

## 2021-08-06 MED ORDER — MAGNESIUM SULFATE 2 GM/50ML IV SOLN
2.0000 g | Freq: Once | INTRAVENOUS | Status: AC
  Administered 2021-08-06: 2 g via INTRAVENOUS
  Filled 2021-08-06: qty 50

## 2021-08-06 MED ORDER — POTASSIUM CHLORIDE 20 MEQ PO PACK
40.0000 meq | PACK | Freq: Once | ORAL | Status: AC
  Administered 2021-08-06: 40 meq via ORAL
  Filled 2021-08-06: qty 2

## 2021-08-06 MED ORDER — KETOROLAC TROMETHAMINE 15 MG/ML IJ SOLN
15.0000 mg | Freq: Four times a day (QID) | INTRAMUSCULAR | Status: DC | PRN
  Administered 2021-08-06 – 2021-08-09 (×12): 15 mg via INTRAVENOUS
  Filled 2021-08-06 (×12): qty 1

## 2021-08-06 MED ORDER — CHLORHEXIDINE GLUCONATE CLOTH 2 % EX PADS
6.0000 | MEDICATED_PAD | Freq: Every day | CUTANEOUS | Status: DC
  Administered 2021-08-06 – 2021-08-09 (×3): 6 via TOPICAL

## 2021-08-06 MED ORDER — ORAL CARE MOUTH RINSE: 15.0000 mL | OROMUCOSAL | Status: DC | PRN

## 2021-08-06 MED ORDER — MUPIROCIN 2 % EX OINT
1.0000 | TOPICAL_OINTMENT | Freq: Two times a day (BID) | CUTANEOUS | Status: AC
  Administered 2021-08-06 – 2021-08-10 (×10): 1 via NASAL
  Filled 2021-08-06: qty 22

## 2021-08-06 MED ORDER — VANCOMYCIN HCL 1500 MG/300ML IV SOLN
1500.0000 mg | Freq: Once | INTRAVENOUS | Status: AC
  Administered 2021-08-06: 1500 mg via INTRAVENOUS
  Filled 2021-08-06: qty 300

## 2021-08-06 MED ORDER — VANCOMYCIN HCL 1250 MG/250ML IV SOLN
1250.0000 mg | Freq: Two times a day (BID) | INTRAVENOUS | Status: DC
Start: 2021-08-06 — End: 2021-08-09
  Administered 2021-08-06 – 2021-08-09 (×6): 1250 mg via INTRAVENOUS
  Filled 2021-08-06 (×6): qty 250

## 2021-08-06 NOTE — Assessment & Plan Note (Addendum)
-   On methadone 

## 2021-08-06 NOTE — Progress Notes (Signed)
Pharmacy Antibiotic Note  Susan Terry is a 46 y.o. female admitted on 08/05/2021 with bacteremia.  Pharmacy has been consulted for Vancomycin dosing.  Plan: Vancomycin 1500 mg x 1, followed by: Vancomycin 1250 mg IV Q 12 hrs.  Goal AUC 400-550. Expected AUC: 510.8 SCr used: 0.66  Pharmacy will continue to follow and will adjust abx dosing whenever warranted.   Weight: 73 kg (161 lb)  Temp (24hrs), Avg:99.5 F (37.5 C), Min:98.9 F (37.2 C), Max:100.1 F (37.8 C)  Recent Labs  Lab 08/05/21 1126 08/05/21 2309  WBC 7.8  --   CREATININE 0.66  --   LATICACIDVEN 1.7 0.8    Estimated Creatinine Clearance: 88.2 mL/min (by C-G formula based on SCr of 0.66 mg/dL).    No Known Allergies  Antimicrobials this admission: 7/11 Vancomycin >>  Microbiology results: 7/10 BCx: 3 of 4 w/ MRSA (mecA/C & MREJ) 7/11 UCx: Pending   Thank you for allowing pharmacy to be a part of this patient's care.  Otelia Sergeant, PharmD, [facility] 08/06/2021 5:36 AM

## 2021-08-06 NOTE — Plan of Care (Signed)
  Problem: Education: Goal: Knowledge of General Education information will improve Description: Including pain rating scale, medication(s)/side effects and non-pharmacologic comfort measures Outcome: Progressing   Problem: Nutrition: Goal: Adequate nutrition will be maintained Outcome: Progressing   Problem: Coping: Goal: Level of anxiety will decrease Outcome: Progressing   Problem: Elimination: Goal: Will not experience complications related to bowel motility Outcome: Progressing Goal: Will not experience complications related to urinary retention Outcome: Progressing   Problem: Pain Managment: Goal: General experience of comfort will improve Outcome: Progressing   Problem: Skin Integrity: Goal: Risk for impaired skin integrity will decrease Outcome: Progressing

## 2021-08-06 NOTE — Assessment & Plan Note (Signed)
Potassium replaced and magnesium level will be optimized.

## 2021-08-06 NOTE — Consult Note (Signed)
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 She was agitated and also receievd versed , fentanyl Vitals in the ED 139/78, HR 013, temp 99.9 Wbc 7.8, Hb 11.8, PLT 157 Blood culture sent MRI was done . She was started on broad spectrum antibiotics Culture positive for MRSA and I am seeing the patient  Past Medical History:  Diagnosis Date   Drug abuse (HCC)    Osteomyelitis (HCC)     Past Surgical History:  Procedure Laterality Date   BACK SURGERY     INCISION AND DRAINAGE FOREARM / WRIST DEEP Right     Social History   Socioeconomic History   Marital status: Single    Spouse name: Not on file   Number of children: Not on file   Years of education: Not on file   Highest education level: Not on file  Occupational History   Not on file  Tobacco Use   Smoking status: Every Day    Packs/day: 0.50    Types: Cigarettes   Smokeless tobacco: Never  Vaping Use   Vaping Use: Former  Substance and Sexual Activity   Alcohol use: No   Drug use: Not Currently    Types: Methamphetamines, IV     Comment: heroin   Sexual activity: Never  Other Topics Concern   Not on file  Social History Narrative   Not on file   Social Determinants of Health   Financial Resource Strain: Not on file  Food Insecurity: Not on file  Transportation Needs: Not on file  Physical Activity: Not on file  Stress: Not on file  Social Connections: Not on file  Intimate Partner Violence: Not on file    Family History  Problem Relation Age of Onset   Cancer Mother        cancer throughout family   No Known Allergies I? Current Facility-Administered Medications  Medication Dose Route Frequency Provider Last Rate Last Admin   0.9 % NaCl with KCl 20 mEq/ L  infusion   Intravenous Continuous Mansy, Jan A, MD 100 mL/hr at 08/06/21 1619 New Bag at 08/06/21 1619   acetaminophen (TYLENOL) tablet 650 mg  650 mg Oral Q6H PRN Mansy, Jan A, MD   650 mg at 08/06/21 0800   Or   acetaminophen (TYLENOL) suppository 650 mg  650 mg Rectal Q6H PRN Mansy, Vernetta Honey, MD       Chlorhexidine Gluconate Cloth 2 % PADS 6 each  6 each Topical Q0600 Mansy, Vernetta Honey, MD   6 each at 08/06/21 0617   enoxaparin (LOVENOX) injection 40 mg  40 mg Subcutaneous  Q24H Mansy, Jan A, MD   40 mg at 08/06/21 0801   ketorolac (TORADOL) 15 MG/ML injection 15 mg  15 mg Intravenous Q6H PRN Mansy, Jan A, MD   15 mg at 08/06/21 1920   magnesium hydroxide (MILK OF MAGNESIA) suspension 30 mL  30 mL Oral Daily PRN Mansy, Jan A, MD       mupirocin ointment (BACTROBAN) 2 % 1 Application  1 Application Nasal BID Mansy, Jan A, MD   1 Application at 08/06/21 0801   ondansetron (ZOFRAN) tablet 4 mg  4 mg Oral Q6H PRN Mansy, Jan A, MD       Or   ondansetron [facility](ZOFRAN) injection 4 mg  4 mg Intravenous Q6H PRN Mansy, Jan A, MD       Oral care mouth rinse  15 mL Mouth Rinse PRN Lurene ShadowAyiku, Bernard, MD       thiamine tablet 100 mg  100 mg Oral Daily Mansy, Jan A, MD   100 mg at 08/05/21 2318   traZODone (DESYREL) tablet 25 mg  25 mg Oral QHS PRN Mansy, Jan A, MD        vancomycin (VANCOREADY) IVPB 1250 mg/250 mL  1,250 mg Intravenous Q12H Otelia SergeantBelue, Nathan S, RPH 166.7 mL/hr at 08/06/21 1631 1,250 mg at 08/06/21 1631     Abtx:  Anti-infectives (From admission, onward)    Start     Dose/Rate Route Frequency Ordered Stop   08/06/21 1830  vancomycin (VANCOREADY) IVPB 1250 mg/250 mL       See Hyperspace for full Linked Orders Report.   1,250 mg 166.7 mL/hr over 90 Minutes Intravenous Every 12 hours 08/06/21 0534     08/06/21 0630  vancomycin (VANCOREADY) IVPB 1500 mg/300 mL       See Hyperspace for full Linked Orders Report.   1,500 mg 150 mL/hr over 120 Minutes Intravenous  Once 08/06/21 0534 08/06/21 0813       REVIEW OF SYSTEMS:  In bed with eyes closed- answers yes or NO Const:  fever, negative chills, negative weight loss Eyes: negative diplopia or visual changes, negative eye pain ENT: negative coryza, negative sore throat Resp: negative cough, hemoptysis, dyspnea Cards: negative for chest pain, palpitations, lower extremity edema GU: negative for frequency, dysuria and hematuria GI: Negative for abdominal pain, diarrhea, bleeding, constipation Skin: negative for rash and pruritus Heme: negative for easy bruising and gum/nose bleeding MS: lower back pain Neurolo:negative for headaches, dizziness, vertigo, memory problems  Psych: IVDA  Endocrine: negative for thyroid, diabetes Allergy/Immunology- negative for any medication or food allergies ? Objective:  VITALS:  BP (!) 157/86 (BP Location: Left Arm)   Pulse (!) 109   Temp 98.3 F (36.8 C)   Resp 16   Wt 73 kg   SpO2 95%   BMI 25.22 kg/m  LDA  PHYSICAL EXAM:  Somnolent- on calling her she responded to questions without opening her eyes- says she will not move because of pain General: no distress,at rest   Head: Normocephalic, without obvious abnormality, atraumatic. Eyes: Conjunctivae clear, anicteric sclerae. Pupils are equal ENT Nares normal. No drainage or sinus  tenderness. Lips, mucosa, and tongue normal. No Thrush Neck: symmetrical, no adenopathy, thyroid: non tender no carotid bruit and no JVD. Back: did not examine Lungs: b/la ir entry - could listen to the back Heart: s1s2. Abdomen: Soft, non-tender,not distended. Bowel sounds normal. No masses Extremities: atraumatic, no cyanosis. No edema. No clubbing Skin: face has many excoriations/scabs Lymph: Cervical, supraclavicular normal. Neurologic: did  not examine in detail Pertinent Labs Lab Results CBC    Component Value Date/Time   WBC 11.9 (H) 08/06/2021 0549   RBC 4.06 08/06/2021 0549   HGB 10.3 (L) 08/06/2021 0549   HCT 32.8 (L) 08/06/2021 0549   PLT 158 08/06/2021 0549   MCV 80.8 08/06/2021 0549   MCH 25.4 (L) 08/06/2021 0549   MCHC 31.4 08/06/2021 0549   RDW 14.4 08/06/2021 0549   LYMPHSABS 1.0 08/05/2021 1126   MONOABS 0.6 08/05/2021 1126   EOSABS 0.0 08/05/2021 1126   BASOSABS 0.0 08/05/2021 1126       Latest Ref Rng & Units 08/06/2021    5:49 AM 08/05/2021   11:26 AM 02/26/2021    2:13 PM  CMP  Glucose 70 - 99 mg/dL 798  921  194   BUN 6 - 20 mg/dL 8  11  17    Creatinine 0.44 - 1.00 mg/dL  1.74  0.81   Sodium 135 - 145 mmol/L 136  137  134   Potassium 3.5 - 5.1 mmol/L 3.7  3.4  3.5   Chloride 98 - 111 mmol/L 101  101  101   CO2 22 - 32 mmol/L 25  25  25    Calcium 8.9 - 10.3 mg/dL 8.2  9.1  8.9   Total Protein 6.5 - 8.1 g/dL  7.5  7.0   Total Bilirubin 0.3 - 1.2 mg/dL  0.8  0.4   Alkaline Phos 38 - 126 U/L  96  106   AST 15 - 41 U/L  33  22   ALT 0 - 44 U/L  24  17       Microbiology: Recent Results (from the past 240 hour(s))  Blood Culture (routine x 2)     Status: None (Preliminary result)   Collection Time: 08/05/21 11:45 AM   Specimen: BLOOD  Result Value Ref Range Status   Specimen Description BLOOD RIGHT ARM  Final   Special Requests   Final    BOTTLES DRAWN AEROBIC AND ANAEROBIC Blood Culture adequate volume   Culture  Setup Time   Final     GRAM POSITIVE COCCI IN BOTH AEROBIC AND ANAEROBIC BOTTLES CRITICAL VALUE NOTED.  VALUE IS CONSISTENT WITH PREVIOUSLY REPORTED AND CALLED VALUE. Performed at [facility], [address] [place], [address] [place]    Culture [facility] POSITIVE COCCI  Final   Report Status PENDING  Incomplete  Blood Culture (routine x 2)     Status: None (Preliminary result)   Collection Time: 08/05/21 11:45 AM   Specimen: BLOOD  Result Value Ref Range Status   Specimen Description BLOOD LEFT ANTECUBITAL  Final   Special Requests   Final    BOTTLES DRAWN AEROBIC AND ANAEROBIC Blood Culture adequate volume   Culture  Setup Time   Final    Organism ID to follow GRAM POSITIVE COCCI IN BOTH AEROBIC AND ANAEROBIC BOTTLES CRITICAL RESULT CALLED TO, READ BACK BY AND VERIFIED WITH: NATHAN BELUTH @ 0335 ON 08/06/[id number] Performed at [facility], [address] [place], [address] [place]    Culture [facility] POSITIVE COCCI  Final   Report Status PENDING  Incomplete  Blood Culture ID Panel (Reflexed)     Status: Abnormal   Collection Time: 08/05/21 11:45 AM  Result Value Ref Range Status   Enterococcus faecalis NOT DETECTED NOT DETECTED Final   Enterococcus Faecium NOT DETECTED NOT DETECTED Final   Listeria monocytogenes NOT DETECTED NOT DETECTED Final   Staphylococcus species  DETECTED (A) NOT DETECTED Final    Comment: CRITICAL RESULT CALLED TO, READ BACK BY AND VERIFIED WITH: NATHAN BELUTH @ 0335 ON 08/06/2021.Marland KitchenMarland KitchenTKR    Staphylococcus aureus (BCID) DETECTED (A) NOT DETECTED Final    Comment: Methicillin (oxacillin)-resistant Staphylococcus aureus (MRSA). MRSA is predictably resistant to beta-lactam antibiotics (except ceftaroline). Preferred therapy is vancomycin unless clinically contraindicated. Patient requires contact precautions if  hospitalized. CRITICAL RESULT CALLED TO, READ BACK BY AND VERIFIED WITH: NATHAN BELUTH @ 0335 ON 08/06/2021.Marland KitchenMarland KitchenTKR    Staphylococcus epidermidis NOT DETECTED  NOT DETECTED Final   Staphylococcus lugdunensis NOT DETECTED NOT DETECTED Final   Streptococcus species NOT DETECTED NOT DETECTED Final   Streptococcus agalactiae NOT DETECTED NOT DETECTED Final   Streptococcus pneumoniae NOT DETECTED NOT DETECTED Final   Streptococcus pyogenes NOT DETECTED NOT DETECTED Final   A.calcoaceticus-baumannii NOT DETECTED NOT DETECTED Final   Bacteroides fragilis NOT DETECTED NOT DETECTED Final   Enterobacterales NOT DETECTED NOT DETECTED Final   Enterobacter cloacae complex NOT DETECTED NOT DETECTED Final   Escherichia coli NOT DETECTED NOT DETECTED Final   Klebsiella aerogenes NOT DETECTED NOT DETECTED Final   Klebsiella oxytoca NOT DETECTED NOT DETECTED Final   Klebsiella pneumoniae NOT DETECTED NOT DETECTED Final   Proteus species NOT DETECTED NOT DETECTED Final   Salmonella species NOT DETECTED NOT DETECTED Final   Serratia marcescens NOT DETECTED NOT DETECTED Final   Haemophilus influenzae NOT DETECTED NOT DETECTED Final   Neisseria meningitidis NOT DETECTED NOT DETECTED Final   Pseudomonas aeruginosa NOT DETECTED NOT DETECTED Final   Stenotrophomonas maltophilia NOT DETECTED NOT DETECTED Final   Candida albicans NOT DETECTED NOT DETECTED Final   Candida auris NOT DETECTED NOT DETECTED Final   Candida glabrata NOT DETECTED NOT DETECTED Final   Candida krusei NOT DETECTED NOT DETECTED Final   Candida parapsilosis NOT DETECTED NOT DETECTED Final   Candida tropicalis NOT DETECTED NOT DETECTED Final   Cryptococcus neoformans/gattii NOT DETECTED NOT DETECTED Final   Meth resistant mecA/C and MREJ DETECTED (A) NOT DETECTED Final    Comment: CRITICAL RESULT CALLED TO, READ BACK BY AND VERIFIED WITH: NATHAN BELUTH @ 0335 ON 08/06/2021.Marland KitchenMarland KitchenTKR Performed at [facility], [address] [place], [place] [address]   MRSA Next Gen by PCR, Nasal     Status: Abnormal   Collection Time: 08/06/21  1:17 AM   Specimen: Nasal Mucosa; Nasal Swab  Result Value  Ref Range Status   MRSA by PCR Next Gen DETECTED (A) NOT DETECTED Final    Comment: RESULT CALLED TO, READ BACK BY AND VERIFIED WITH: Rosann Auerbach RN 0424 08/06/21 HNM (NOTE) The GeneXpert MRSA Assay (FDA approved for NASAL specimens only), is one component of a comprehensive MRSA colonization surveillance program. It is not intended to diagnose MRSA infection nor to guide or monitor treatment for MRSA infections. Test performance is not FDA approved in patients less than 93 years old. Performed at [facility], [address] [place], [place] [address]     IMAGING RESULTS: MRI cervical/thoracic and lumbar spine Rt facte joint effusion at C6-C7 degenerative  Remote c5-c6 discitis/osteo  Lumbar spine No suspected infection Mild discal fluid at l4-L5 but no discitis L2-L3 posterior fusion I have personally reviewed the films ? Impression/Recommendation ? MRSA bacteremia- IVDA With low back pain need to r/o infection in the spine ( MRI only precontrast- no infection- will have to repeat the study ) On vanco'will repeat blood culture 2 d echo May need TEE  H/o cervical  and lumbar spine infection- treated  H/o lumbar spine fusion  Polysubstance USE IVDA  Discussed the management with patient and care team   ? ? ___________________________________________________ Discussed with patient, requesting provider Note:  This document was prepared using Dragon voice recognition software and may include unintentional dictation errors.

## 2021-08-06 NOTE — Progress Notes (Addendum)
Progress Note    Susan Bunoel Marie Desire  ZOX:[id number] DOB: 12-Jan-1976  DOA: 08/05/2021 PCP: Pcp, No      Brief Narrative:    Medical records reviewed and are as summarized below:  Susan Terry is a 46 y.o. female with medical history significant for cervical osteomyelitis, IV heroin use disorder, who presented to the hospital with acute onset of low back pain that started about a day prior to admission.  She has been using Percocet and IV heroin at home.  In route to the hospital, she became hypoxic and apneic and treatment and she was treated with Narcan.  Upon arrival to the hospital, she was in acute withdrawal with agitation and psychotic behavior.      Assessment/Plan:   Principal Problem:   Altered mental status Active Problems:   Acute respiratory failure with hypoxia (HCC)   Back pain   Hypokalemia   Body mass index is 25.22 kg/m.   MRSA bacteremia in a patient with IV drug use and history of cervical C5-C6 osteomyelitis: No evidence of vegetation on 2D echo.  She may need TEE.  Await ID's recommendation.  Continue IV vancomycin.  Consulted with Dr. Rivka Saferavishankar, ID specialist, to assist with management.  Acute on chronic back pain, multilevel disc bulging involving L3-S1,: MRI cervical, thoracic and lumbar spine did not show any evidence of acute infection.  Acute hypoxic respiratory failure: Taper off oxygen as able  Acute toxic metabolic encephalopathy: Improved  Hypokalemia: Improved  Diet Order             Diet Heart Room service appropriate? Yes; Fluid consistency: Thin  Diet effective now                            Consultants: Infectious disease  Procedures: None    Medications:    Chlorhexidine Gluconate Cloth  6 each Topical Q0600   enoxaparin (LOVENOX) injection  40 mg Subcutaneous Q24H   mupirocin ointment  1 Application Nasal BID   thiamine  100 mg Oral Daily   Continuous Infusions:  0.9 % NaCl with KCl  20 mEq / L Stopped (08/06/21 [id number])   vancomycin       Anti-infectives (From admission, onward)    Start     Dose/Rate Route Frequency Ordered Stop   08/06/21 1830  vancomycin (VANCOREADY) IVPB 1250 mg/250 mL       See Hyperspace for full Linked Orders Report.   1,250 mg 166.7 mL/hr over 90 Minutes Intravenous Every 12 hours 08/06/21 0534     08/06/21 0630  vancomycin (VANCOREADY) IVPB 1500 mg/300 mL       See Hyperspace for full Linked Orders Report.   1,500 mg 150 mL/hr over 120 Minutes Intravenous  Once 08/06/21 0534 08/06/21 0813              Family Communication/Anticipated D/C date and plan/Code Status   DVT prophylaxis: enoxaparin (LOVENOX) injection 40 mg Start: 08/06/21 0800     Code Status: Full Code  Family Communication: None Disposition Plan: To be determined   Status is: Inpatient Remains inpatient appropriate because: Staph aureus bacteremia on IV antibiotics         Subjective:   Interval events noted.  C/o severe lower back pain  Objective:    Vitals:   08/06/21 0009 08/06/21 0431 08/06/21 0714 08/06/21 0814  BP: (!) 152/93 138/81 137/84   Pulse: 100 (!) 107 (!) 107  Resp: 18 20 15    Temp: 100.1 F (37.8 C) 99.6 F (37.6 C) 100 F (37.8 C)   TempSrc: Oral     SpO2: 96% 95% (!) 89% 92%  Weight:       No data found.   Intake/Output Summary (Last 24 hours) at 08/06/2021 1026 Last data filed at 08/06/2021 1006 Gross per 24 hour  Intake 796.08 ml  Output 800 ml  Net -3.92 ml   Filed Weights   08/05/21 2255  Weight: 73 kg    Exam:  GEN: NAD SKIN: Warm and dry EYES: No pallor or icterus ENT: MMM CV: RRR PULM: CTA B ABD: soft, ND, NT, +BS CNS: AAO x 3, non focal EXT: No edema or tenderness MSK: She said she can't sit up or roll over for a back exam       Data Reviewed:   I have personally reviewed following labs and imaging studies:  Labs: Labs show the following:   Basic Metabolic Panel: Recent Labs   Lab 08/05/21 1126 08/05/21 1155 08/06/21 0549  NA 137  --  136  K 3.4*  --  3.7  CL 101  --  101  CO2 25  --  25  GLUCOSE 129*  --  104*  BUN 11  --  8  CREATININE 0.66  --  0.59  CALCIUM 9.1  --  8.2*  MG  --  1.7  --    GFR Estimated Creatinine Clearance: 88.2 mL/min (by C-G formula based on SCr of 0.59 mg/dL). Liver Function Tests: Recent Labs  Lab 08/05/21 1126  AST 33  ALT 24  ALKPHOS 96  BILITOT 0.8  PROT 7.5  ALBUMIN 3.8   No results for input(s): "LIPASE", "AMYLASE" in the last 168 hours. No results for input(s): "AMMONIA" in the last 168 hours. Coagulation profile Recent Labs  Lab 08/05/21 1155  INR 1.0    CBC: Recent Labs  Lab 08/05/21 1126 08/06/21 0549  WBC 7.8 11.9*  NEUTROABS 6.1  --   HGB 11.8* 10.3*  HCT 36.8 32.8*  MCV 80.2 80.8  PLT 157 158   Cardiac Enzymes: No results for input(s): "CKTOTAL", "CKMB", "CKMBINDEX", "TROPONINI" in the last 168 hours. BNP (last 3 results) No results for input(s): "PROBNP" in the last 8760 hours. CBG: No results for input(s): "GLUCAP" in the last 168 hours. D-Dimer: Recent Labs    08/05/21 2309  DDIMER 2.02*   Hgb A1c: No results for input(s): "HGBA1C" in the last 72 hours. Lipid Profile: No results for input(s): "CHOL", "HDL", "LDLCALC", "TRIG", "CHOLHDL", "LDLDIRECT" in the last 72 hours. Thyroid function studies: No results for input(s): "TSH", "T4TOTAL", "T3FREE", "THYROIDAB" in the last 72 hours.  Invalid input(s): "FREET3" Anemia work up: No results for input(s): "VITAMINB12", "FOLATE", "FERRITIN", "TIBC", "IRON", "RETICCTPCT" in the last 72 hours. Sepsis Labs: Recent Labs  Lab 08/05/21 1126 08/05/21 2309 08/06/21 0549  PROCALCITON 0.10  --  0.23  WBC 7.8  --  11.9*  LATICACIDVEN 1.7 0.8  --     Microbiology Recent Results (from the past 240 hour(s))  Blood Culture (routine x 2)     Status: None (Preliminary result)   Collection Time: 08/05/21 11:45 AM   Specimen: BLOOD   Result Value Ref Range Status   Specimen Description BLOOD RIGHT ARM  Final   Special Requests   Final    BOTTLES DRAWN AEROBIC AND ANAEROBIC Blood Culture adequate volume   Culture  Setup Time   Final  GRAM POSITIVE COCCI IN BOTH AEROBIC AND ANAEROBIC BOTTLES CRITICAL VALUE NOTED.  VALUE IS CONSISTENT WITH PREVIOUSLY REPORTED AND CALLED VALUE. Performed at [facility], [address] [place], [place] [address]    Culture [facility] POSITIVE COCCI  Final   Report Status PENDING  Incomplete  Blood Culture (routine x 2)     Status: None (Preliminary result)   Collection Time: 08/05/21 11:45 AM   Specimen: BLOOD  Result Value Ref Range Status   Specimen Description BLOOD LEFT ANTECUBITAL  Final   Special Requests   Final    BOTTLES DRAWN AEROBIC AND ANAEROBIC Blood Culture adequate volume   Culture  Setup Time   Final    Organism ID to follow GRAM POSITIVE COCCI IN BOTH AEROBIC AND ANAEROBIC BOTTLES CRITICAL RESULT CALLED TO, READ BACK BY AND VERIFIED WITH: NATHAN BELUTH @ 0335 ON 08/06/2021.Marland KitchenMarland KitchenTKR Performed at [facility], [address] [place], [place] [address]    Culture [facility] POSITIVE COCCI  Final   Report Status PENDING  Incomplete  Blood Culture ID Panel (Reflexed)     Status: Abnormal   Collection Time: 08/05/21 11:45 AM  Result Value Ref Range Status   Enterococcus faecalis NOT DETECTED NOT DETECTED Final   Enterococcus Faecium NOT DETECTED NOT DETECTED Final   Listeria monocytogenes NOT DETECTED NOT DETECTED Final   Staphylococcus species DETECTED (A) NOT DETECTED Final    Comment: CRITICAL RESULT CALLED TO, READ BACK BY AND VERIFIED WITH: NATHAN BELUTH @ 0335 ON 08/06/2021.Marland KitchenMarland KitchenTKR    Staphylococcus aureus (BCID) DETECTED (A) NOT DETECTED Final    Comment: Methicillin (oxacillin)-resistant Staphylococcus aureus (MRSA). MRSA is predictably resistant to beta-lactam antibiotics (except ceftaroline). Preferred therapy is vancomycin unless clinically  contraindicated. Patient requires contact precautions if  hospitalized. CRITICAL RESULT CALLED TO, READ BACK BY AND VERIFIED WITH: NATHAN BELUTH @ 0335 ON 08/06/2021.Marland KitchenMarland KitchenTKR    Staphylococcus epidermidis NOT DETECTED NOT DETECTED Final   Staphylococcus lugdunensis NOT DETECTED NOT DETECTED Final   Streptococcus species NOT DETECTED NOT DETECTED Final   Streptococcus agalactiae NOT DETECTED NOT DETECTED Final   Streptococcus pneumoniae NOT DETECTED NOT DETECTED Final   Streptococcus pyogenes NOT DETECTED NOT DETECTED Final   A.calcoaceticus-baumannii NOT DETECTED NOT DETECTED Final   Bacteroides fragilis NOT DETECTED NOT DETECTED Final   Enterobacterales NOT DETECTED NOT DETECTED Final   Enterobacter cloacae complex NOT DETECTED NOT DETECTED Final   Escherichia coli NOT DETECTED NOT DETECTED Final   Klebsiella aerogenes NOT DETECTED NOT DETECTED Final   Klebsiella oxytoca NOT DETECTED NOT DETECTED Final   Klebsiella pneumoniae NOT DETECTED NOT DETECTED Final   Proteus species NOT DETECTED NOT DETECTED Final   Salmonella species NOT DETECTED NOT DETECTED Final   Serratia marcescens NOT DETECTED NOT DETECTED Final   Haemophilus influenzae NOT DETECTED NOT DETECTED Final   Neisseria meningitidis NOT DETECTED NOT DETECTED Final   Pseudomonas aeruginosa NOT DETECTED NOT DETECTED Final   Stenotrophomonas maltophilia NOT DETECTED NOT DETECTED Final   Candida albicans NOT DETECTED NOT DETECTED Final   Candida auris NOT DETECTED NOT DETECTED Final   Candida glabrata NOT DETECTED NOT DETECTED Final   Candida krusei NOT DETECTED NOT DETECTED Final   Candida parapsilosis NOT DETECTED NOT DETECTED Final   Candida tropicalis NOT DETECTED NOT DETECTED Final   Cryptococcus neoformans/gattii NOT DETECTED NOT DETECTED Final   Meth resistant mecA/C and MREJ DETECTED (A) NOT DETECTED Final    Comment: CRITICAL RESULT CALLED TO, READ BACK BY AND VERIFIED WITH: NATHAN BELUTH @  0335 ON  08/06/2021.Marland KitchenMarland KitchenTKR Performed at [facility], [address] [place], [place] [address]   MRSA Next Gen by PCR, Nasal     Status: Abnormal   Collection Time: 08/06/21  1:17 AM   Specimen: Nasal Mucosa; Nasal Swab  Result Value Ref Range Status   MRSA by PCR Next Gen DETECTED (A) NOT DETECTED Final    Comment: RESULT CALLED TO, READ BACK BY AND VERIFIED WITH: Rosann Auerbach RN 0424 08/06/21 HNM (NOTE) The GeneXpert MRSA Assay (FDA approved for NASAL specimens only), is one component of a comprehensive MRSA colonization surveillance program. It is not intended to diagnose MRSA infection nor to guide or monitor treatment for MRSA infections. Test performance is not FDA approved in patients less than 8 years old. Performed at [facility], [address] [place], [place] [address]     Procedures and diagnostic studies:  MR THORACIC SPINE W WO CONTRAST  Result Date: 08/05/2021 CLINICAL DATA:  Osteomyelitis, cervical. EXAM: MRI THORACIC AND LUMBAR WITHOUT AND WITH CONTRAST MRI CERVICAL SPINE WITHOUT CONTRAST TECHNIQUE: Multisequence MR imaging of the thoracic and lumbar was performed prior to and following IV contrast administration for evaluation of spinal metastatic disease. Pre and postcontrast imaging of the cervical spine was planned but the patient did not tolerate completion of the cervical spine portion which was only obtained without contrast. CONTRAST:  7mL GADAVIST GADOBUTROL 1 MMOL/ML IV SOLN COMPARISON:  Cervical MRI 04/01/2020 FINDINGS: MRI CERVICAL SPINE FINDINGS Alignment: Reversal of cervical lordosis with mild C3-4 and C4-5 anterolisthesis Vertebrae: Intervertebral ankylosis at C5-6 where there was prior osteomyelitis. No evidence of fracture, active discitis, or aggressive bone lesion. C6-7 right facet joint fluid and mild marrow edema where there is prominent degenerative spurring. Cord: Normal signal and morphology. No evidence of spinal canal collection  Posterior Fossa, vertebral arteries, paraspinal tissues: No evidence of paravertebral edema or mass Disc levels: C2-3: Degenerative facet spurring on the right more than left. Mild right foraminal narrowing C3-4: Degenerative facet spurring which is greater on the left. Bilateral uncovertebral spurring. Left foraminal impingement C4-5: Disc narrowing and bulging with uncovertebral ridging. Facet osteoarthritis with bulky spurring asymmetric to the left. Moderate left foraminal stenosis C5-6: Intervertebral ankylosis. Disc space narrowing crowds the foramina but canal and foraminal patency seen on both sides C6-7: Facet osteoarthritis with spurring and joint effusion on the right where there is mild marrow edema. Disc is narrowed and bulging with partial CSF effacement. Patent foramina with mild narrowing bilaterally. C7-T1:Degenerative facet spurring.  No neural impingement MRI THORACIC SPINE FINDINGS Alignment:  Mild scoliosis Vertebrae: No fracture, evidence of discitis, or bone lesion. Mildly accentuated epidural venous plexus enhancement at the level of disc herniations, but no infection suspected. Cord:  Normal signal and morphology. Paraspinal and other soft tissues: Negative. Disc levels: Left paracentral protrusions at T6-7 to T8-9. Small bilateral paracentral protrusions at T2-3 to T5-6. No significant facet spurring. No neural impingement. MRI LUMBAR SPINE FINDINGS Segmentation:  Rudimentary disc space at S1-2. Alignment:  Mild scoliosis Vertebrae: Mild disc T2 hyperintensity at L4-5, but no marrow edema or destruction of adjacent endplates. L2-3 posterior fusion hardware. No acute fracture. No aggressive bone lesion. Conus medullaris: Extends to the L1 level and appears normal. Paraspinal and other soft tissues: Negative for perispinal mass or inflammation. Disc levels: T12- L1: Unremarkable. L1-L2: Unremarkable. L2-L3: Disc collapse with posterior fusion.  No neural impingement L3-L4: Mild disc bulging  and facet spurring L4-L5: Disc bulging and mild endplate degeneration with  endplate and facet spurring. Mild triangular narrowing the thecal sac L5-S1:The disc is narrowed and bulging. Degenerative facet spurring on both sides. Moderate bilateral foraminal stenosis. IMPRESSION: Cervical spine: 1. Precontrast only due to patient condition. No suspected acute infection. There is a right facet joint effusion at C6-7, but this is likely degenerative given the degree of spurring and mild associated marrow edema. 2. Sequela of remote C5-6 discitis osteomyelitis with intervertebral ankylosis. Thoracic spine: 1. No acute finding. 2. Small disc protrusions as described. Lumbar spine: 1. No suspected infection. There is mild discal fluid at L4-5 but no associated findings of discitis. 2. Lumbar spine degeneration and scoliosis with moderate bilateral foraminal narrowing at L5-S1. Electronically Signed   By: Tiburcio Pea M.D.   On: 08/05/2021 13:46   MR Lumbar Spine W Wo Contrast  Result Date: 08/05/2021 CLINICAL DATA:  Osteomyelitis, cervical. EXAM: MRI THORACIC AND LUMBAR WITHOUT AND WITH CONTRAST MRI CERVICAL SPINE WITHOUT CONTRAST TECHNIQUE: Multisequence MR imaging of the thoracic and lumbar was performed prior to and following IV contrast administration for evaluation of spinal metastatic disease. Pre and postcontrast imaging of the cervical spine was planned but the patient did not tolerate completion of the cervical spine portion which was only obtained without contrast. CONTRAST:  13mL GADAVIST GADOBUTROL 1 MMOL/ML IV SOLN COMPARISON:  Cervical MRI 04/01/2020 FINDINGS: MRI CERVICAL SPINE FINDINGS Alignment: Reversal of cervical lordosis with mild C3-4 and C4-5 anterolisthesis Vertebrae: Intervertebral ankylosis at C5-6 where there was prior osteomyelitis. No evidence of fracture, active discitis, or aggressive bone lesion. C6-7 right facet joint fluid and mild marrow edema where there is prominent degenerative  spurring. Cord: Normal signal and morphology. No evidence of spinal canal collection Posterior Fossa, vertebral arteries, paraspinal tissues: No evidence of paravertebral edema or mass Disc levels: C2-3: Degenerative facet spurring on the right more than left. Mild right foraminal narrowing C3-4: Degenerative facet spurring which is greater on the left. Bilateral uncovertebral spurring. Left foraminal impingement C4-5: Disc narrowing and bulging with uncovertebral ridging. Facet osteoarthritis with bulky spurring asymmetric to the left. Moderate left foraminal stenosis C5-6: Intervertebral ankylosis. Disc space narrowing crowds the foramina but canal and foraminal patency seen on both sides C6-7: Facet osteoarthritis with spurring and joint effusion on the right where there is mild marrow edema. Disc is narrowed and bulging with partial CSF effacement. Patent foramina with mild narrowing bilaterally. C7-T1:Degenerative facet spurring.  No neural impingement MRI THORACIC SPINE FINDINGS Alignment:  Mild scoliosis Vertebrae: No fracture, evidence of discitis, or bone lesion. Mildly accentuated epidural venous plexus enhancement at the level of disc herniations, but no infection suspected. Cord:  Normal signal and morphology. Paraspinal and other soft tissues: Negative. Disc levels: Left paracentral protrusions at T6-7 to T8-9. Small bilateral paracentral protrusions at T2-3 to T5-6. No significant facet spurring. No neural impingement. MRI LUMBAR SPINE FINDINGS Segmentation:  Rudimentary disc space at S1-2. Alignment:  Mild scoliosis Vertebrae: Mild disc T2 hyperintensity at L4-5, but no marrow edema or destruction of adjacent endplates. L2-3 posterior fusion hardware. No acute fracture. No aggressive bone lesion. Conus medullaris: Extends to the L1 level and appears normal. Paraspinal and other soft tissues: Negative for perispinal mass or inflammation. Disc levels: T12- L1: Unremarkable. L1-L2: Unremarkable. L2-L3:  Disc collapse with posterior fusion.  No neural impingement L3-L4: Mild disc bulging and facet spurring L4-L5: Disc bulging and mild endplate degeneration with endplate and facet spurring. Mild triangular narrowing the thecal sac L5-S1:The disc is narrowed and bulging. Degenerative facet spurring on  both sides. Moderate bilateral foraminal stenosis. IMPRESSION: Cervical spine: 1. Precontrast only due to patient condition. No suspected acute infection. There is a right facet joint effusion at C6-7, but this is likely degenerative given the degree of spurring and mild associated marrow edema. 2. Sequela of remote C5-6 discitis osteomyelitis with intervertebral ankylosis. Thoracic spine: 1. No acute finding. 2. Small disc protrusions as described. Lumbar spine: 1. No suspected infection. There is mild discal fluid at L4-5 but no associated findings of discitis. 2. Lumbar spine degeneration and scoliosis with moderate bilateral foraminal narrowing at L5-S1. Electronically Signed   By: Tiburcio Pea M.D.   On: 08/05/2021 13:46   MR CERVICAL SPINE WO CONTRAST  Result Date: 08/05/2021 CLINICAL DATA:  Osteomyelitis, cervical. EXAM: MRI THORACIC AND LUMBAR WITHOUT AND WITH CONTRAST MRI CERVICAL SPINE WITHOUT CONTRAST TECHNIQUE: Multisequence MR imaging of the thoracic and lumbar was performed prior to and following IV contrast administration for evaluation of spinal metastatic disease. Pre and postcontrast imaging of the cervical spine was planned but the patient did not tolerate completion of the cervical spine portion which was only obtained without contrast. CONTRAST:  7mL GADAVIST GADOBUTROL 1 MMOL/ML IV SOLN COMPARISON:  Cervical MRI 04/01/2020 FINDINGS: MRI CERVICAL SPINE FINDINGS Alignment: Reversal of cervical lordosis with mild C3-4 and C4-5 anterolisthesis Vertebrae: Intervertebral ankylosis at C5-6 where there was prior osteomyelitis. No evidence of fracture, active discitis, or aggressive bone lesion. C6-7  right facet joint fluid and mild marrow edema where there is prominent degenerative spurring. Cord: Normal signal and morphology. No evidence of spinal canal collection Posterior Fossa, vertebral arteries, paraspinal tissues: No evidence of paravertebral edema or mass Disc levels: C2-3: Degenerative facet spurring on the right more than left. Mild right foraminal narrowing C3-4: Degenerative facet spurring which is greater on the left. Bilateral uncovertebral spurring. Left foraminal impingement C4-5: Disc narrowing and bulging with uncovertebral ridging. Facet osteoarthritis with bulky spurring asymmetric to the left. Moderate left foraminal stenosis C5-6: Intervertebral ankylosis. Disc space narrowing crowds the foramina but canal and foraminal patency seen on both sides C6-7: Facet osteoarthritis with spurring and joint effusion on the right where there is mild marrow edema. Disc is narrowed and bulging with partial CSF effacement. Patent foramina with mild narrowing bilaterally. C7-T1:Degenerative facet spurring.  No neural impingement MRI THORACIC SPINE FINDINGS Alignment:  Mild scoliosis Vertebrae: No fracture, evidence of discitis, or bone lesion. Mildly accentuated epidural venous plexus enhancement at the level of disc herniations, but no infection suspected. Cord:  Normal signal and morphology. Paraspinal and other soft tissues: Negative. Disc levels: Left paracentral protrusions at T6-7 to T8-9. Small bilateral paracentral protrusions at T2-3 to T5-6. No significant facet spurring. No neural impingement. MRI LUMBAR SPINE FINDINGS Segmentation:  Rudimentary disc space at S1-2. Alignment:  Mild scoliosis Vertebrae: Mild disc T2 hyperintensity at L4-5, but no marrow edema or destruction of adjacent endplates. L2-3 posterior fusion hardware. No acute fracture. No aggressive bone lesion. Conus medullaris: Extends to the L1 level and appears normal. Paraspinal and other soft tissues: Negative for perispinal  mass or inflammation. Disc levels: T12- L1: Unremarkable. L1-L2: Unremarkable. L2-L3: Disc collapse with posterior fusion.  No neural impingement L3-L4: Mild disc bulging and facet spurring L4-L5: Disc bulging and mild endplate degeneration with endplate and facet spurring. Mild triangular narrowing the thecal sac L5-S1:The disc is narrowed and bulging. Degenerative facet spurring on both sides. Moderate bilateral foraminal stenosis. IMPRESSION: Cervical spine: 1. Precontrast only due to patient condition. No suspected acute infection. There  is a right facet joint effusion at C6-7, but this is likely degenerative given the degree of spurring and mild associated marrow edema. 2. Sequela of remote C5-6 discitis osteomyelitis with intervertebral ankylosis. Thoracic spine: 1. No acute finding. 2. Small disc protrusions as described. Lumbar spine: 1. No suspected infection. There is mild discal fluid at L4-5 but no associated findings of discitis. 2. Lumbar spine degeneration and scoliosis with moderate bilateral foraminal narrowing at L5-S1. Electronically Signed   By: Tiburcio Pea M.D.   On: 08/05/2021 13:46               LOS: 0 days   Meko Bellanger  [facility]   Pager on [email or web address]. If 7PM-7AM, please contact night-coverage at [email or web address]     08/06/2021, 10:26 AM

## 2021-08-06 NOTE — Progress Notes (Signed)
PHARMACY - PHYSICIAN COMMUNICATION CRITICAL VALUE ALERT - BLOOD CULTURE IDENTIFICATION (BCID)  Results for orders placed or performed during the hospital encounter of 11/22/18  Blood Culture ID Panel (Reflexed) (Collected: 11/22/2018  1:31 PM)  Result Value Ref Range   Enterococcus species NOT DETECTED NOT DETECTED   Listeria monocytogenes NOT DETECTED NOT DETECTED   Staphylococcus species DETECTED (A) NOT DETECTED   Staphylococcus aureus (BCID) DETECTED (A) NOT DETECTED   Methicillin resistance DETECTED (A) NOT DETECTED   Streptococcus species NOT DETECTED NOT DETECTED   Streptococcus agalactiae NOT DETECTED NOT DETECTED   Streptococcus pneumoniae NOT DETECTED NOT DETECTED   Streptococcus pyogenes NOT DETECTED NOT DETECTED   Acinetobacter baumannii NOT DETECTED NOT DETECTED   Enterobacteriaceae species NOT DETECTED NOT DETECTED   Enterobacter cloacae complex NOT DETECTED NOT DETECTED   Escherichia coli NOT DETECTED NOT DETECTED   Klebsiella oxytoca NOT DETECTED NOT DETECTED   Klebsiella pneumoniae NOT DETECTED NOT DETECTED   Proteus species NOT DETECTED NOT DETECTED   Serratia marcescens NOT DETECTED NOT DETECTED   Haemophilus influenzae NOT DETECTED NOT DETECTED   Neisseria meningitidis NOT DETECTED NOT DETECTED   Pseudomonas aeruginosa NOT DETECTED NOT DETECTED   Candida albicans NOT DETECTED NOT DETECTED   Candida glabrata NOT DETECTED NOT DETECTED   Candida krusei NOT DETECTED NOT DETECTED   Candida parapsilosis NOT DETECTED NOT DETECTED   Candida tropicalis NOT DETECTED NOT DETECTED   BCID: 1 (anaerobic) of 4 w/ MRSA (mecA/C and MREJ detected) but WBC WNL and 100.1 max temp.  Pt has not been order any abx at this time.  Name of provider contacted: Docia Furl, NP  Changes to prescribed antibiotics required: Start Vancomycin  Otelia Sergeant, PharmD, [facility] 08/06/2021 3:51 AM

## 2021-08-06 NOTE — Progress Notes (Signed)
       CROSS COVER NOTE  NAME: Susan Terry MRN: [id number] DOB : Dec 09, 1975    Date of Service   08/06/2021  HPI/Events of Note   Notified of MRSA Bacteremia BCID results by pharmacy  Interventions   Plan: Vancomycin per pharmacy ECHO ID Consult       This document was prepared using Dragon voice recognition software and may include unintentional dictation errors.  Bishop Limbo DNP, MHA, FNP-BC Nurse Practitioner [facility] Pager [phone]

## 2021-08-06 NOTE — Assessment & Plan Note (Addendum)
-   This like secondary to opiates. - O2 protocol will be followed and will withdrawal oxygen as tolerated. - This could be contributing to her altered mental status

## 2021-08-06 NOTE — Assessment & Plan Note (Addendum)
-   This likely secondary to hypoxia and opiates abuse. - Monitor for opiate withdrawal

## 2021-08-07 DIAGNOSIS — F1920 Other psychoactive substance dependence, uncomplicated: Secondary | ICD-10-CM

## 2021-08-07 DIAGNOSIS — F1120 Opioid dependence, uncomplicated: Secondary | ICD-10-CM

## 2021-08-07 LAB — URINE CULTURE: Culture: NO GROWTH

## 2021-08-07 LAB — PROCALCITONIN: Procalcitonin: 0.25 ng/mL

## 2021-08-07 LAB — CREATININE, SERUM
Creatinine, Ser: 0.61 mg/dL (ref 0.44–1.00)
GFR, Estimated: >60 mL/min (ref >60)

## 2021-08-07 MED ORDER — HYDROMORPHONE HCL 1 MG/ML IJ SOLN
1.0000 mg | INTRAMUSCULAR | Status: DC | PRN
  Administered 2021-08-07 – 2021-08-10 (×13): 1 mg via INTRAVENOUS
  Filled 2021-08-07 (×13): qty 1

## 2021-08-07 MED ORDER — PREGABALIN 25 MG PO CAPS
25.0000 mg | ORAL_CAPSULE | Freq: Two times a day (BID) | ORAL | Status: DC
  Administered 2021-08-07 – 2021-08-27 (×41): 25 mg via ORAL
  Filled 2021-08-07 (×41): qty 1

## 2021-08-07 MED ORDER — IBUPROFEN 400 MG PO TABS
400.0000 mg | ORAL_TABLET | Freq: Four times a day (QID) | ORAL | Status: DC
  Administered 2021-08-07 – 2021-08-09 (×10): 400 mg via ORAL
  Filled 2021-08-07 (×10): qty 1

## 2021-08-07 NOTE — Progress Notes (Signed)
At bedside for a second consult for PIV placement. Pt states it is "painful." Multiple scabs, bruises and sores in bilateral extremities. Able to find 1 compressible vessel for cannulation at this time. Secure chat with Dr. Rivka Safer, ID regarding the potential for Vancomycin long term. Recommended PICC placement, however, MD does not want to place a PICC at this time due numerous AMA's. LPN aware.

## 2021-08-07 NOTE — Progress Notes (Signed)
Report given to [place], Charity fundraiser. Pt transferred upstairs w/ her personal belongings by Nurse and tech.

## 2021-08-07 NOTE — Progress Notes (Signed)
Patient had episode of sinus tachy, VS taken, NP on duty notified. Patient is in pain.

## 2021-08-07 NOTE — Progress Notes (Addendum)
[facility]  - [place] at [facility]   PATIENT NAME: Susan Terry    MR#:  [id number]  DATE OF BIRTH:  Mar 16, 1975  SUBJECTIVE:  patient keeps her eyes closed throughout my conversation. Answers all questions appropriately. No family at bedside. Complains of low back pain. Has been drinking juice and eating. No fever. Tells me to give pain medications which were used the last time she was admitted with similar issues. Had he used IV heroin and methamphetamine before coming to the hospital.   VITALS:  Blood pressure (!) 145/92, pulse 98, temperature 97.7 F (36.5 C), resp. rate 16, weight 73 kg, SpO2 97 %.  PHYSICAL EXAMINATION:   GENERAL:  46 y.o.-year-old patient lying in the bed with no acute distress.  LUNGS: Normal breath sounds bilaterally, no wheezing, rales, rhonchi.  CARDIOVASCULAR: S1, S2 normal. No murmurs, rubs, or gallops.  ABDOMEN: Soft, nontender, nondistended. Bowel sounds present.  EXTREMITIES: multiple tattoos and scabs both upper extremity NEUROLOGIC: nonfocal  patient is alert but keeps eyes closed.  LABORATORY PANEL:  CBC Recent Labs  Lab 08/06/21 0549  WBC 11.9*  HGB 10.3*  HCT 32.8*  PLT 158    Chemistries  Recent Labs  Lab 08/05/21 1126 08/05/21 1155 08/06/21 0549 08/07/21 0216  NA 137  --  136  --   K 3.4*  --  3.7  --   CL 101  --  101  --   CO2 25  --  25  --   GLUCOSE 129*  --  104*  --   BUN 11  --  8  --   CREATININE 0.66  --  0.59 0.61  CALCIUM 9.1  --  8.2*  --   MG  --  1.7  --   --   AST 33  --   --   --   ALT 24  --   --   --   ALKPHOS 96  --   --   --   BILITOT 0.8  --   --   --    Cardiac Enzymes No results for input(s): "TROPONINI" in the last 168 hours. RADIOLOGY:  ECHOCARDIOGRAM COMPLETE  Result Date: 08/06/2021    ECHOCARDIOGRAM REPORT   Patient Name:   Susan Terry Date of Exam: 08/06/2021 Medical Rec #:  [id number]           Height:       67.0 in Accession #:    [id number]          Weight:        161.0 lb Date of Birth:  Mar 04, 1975          BSA:          1.844 m Patient Age:    46 years            BP:           137/84 mmHg Patient Gender: F                   HR:           106 bpm. Exam Location:  [facility] Procedure: 2D Echo, Cardiac Doppler and Color Doppler Indications:     R78.81 Bacteremia  History:         Patient has prior history of Echocardiogram examinations. Risk                  Factors:Current Smoker and IVDU.  Sonographer:  Ceasar Mons Referring Phys:  [id number] Lanney Gins FOUST Diagnosing Phys: Arnoldo Hooker MD  Sonographer Comments: Image acquisition challenging due to respiratory motion. IMPRESSIONS  1. Left ventricular ejection fraction, by estimation, is 65 to 70%. The left ventricle has normal function. The left ventricle has no regional wall motion abnormalities. Left ventricular diastolic parameters were normal.  2. Right ventricular systolic function is normal. The right ventricular size is normal.  3. The mitral valve is normal in structure. Trivial mitral valve regurgitation.  4. The aortic valve is normal in structure. Aortic valve regurgitation is not visualized. FINDINGS  Left Ventricle: Left ventricular ejection fraction, by estimation, is 65 to 70%. The left ventricle has normal function. The left ventricle has no regional wall motion abnormalities. The left ventricular internal cavity size was normal in size. There is  no left ventricular hypertrophy. Left ventricular diastolic parameters were normal. Right Ventricle: The right ventricular size is normal. No increase in right ventricular wall thickness. Right ventricular systolic function is normal. Left Atrium: Left atrial size was normal in size. Right Atrium: Right atrial size was normal in size. Pericardium: There is no evidence of pericardial effusion. Mitral Valve: The mitral valve is normal in structure. Trivial mitral valve regurgitation. Tricuspid Valve: The tricuspid valve is normal in structure. Tricuspid valve  regurgitation is trivial. Aortic Valve: The aortic valve is normal in structure. Aortic valve regurgitation is not visualized. Aortic valve mean gradient measures 7.0 mmHg. Aortic valve peak gradient measures 10.6 mmHg. Aortic valve area, by VTI measures 1.95 cm. Pulmonic Valve: The pulmonic valve was normal in structure. Pulmonic valve regurgitation is not visualized. Aorta: The aortic root and ascending aorta are structurally normal, with no evidence of dilitation. IAS/Shunts: No atrial level shunt detected by color flow Doppler.  LEFT VENTRICLE PLAX 2D LVIDd:         4.07 cm   Diastology LVIDs:         2.50 cm   LV e' medial:    11.30 cm/s LV PW:         1.02 cm   LV E/e' medial:  8.3 LV IVS:        0.95 cm   LV e' lateral:   14.60 cm/s LVOT diam:     1.80 cm   LV E/e' lateral: 6.4 LV SV:         60 LV SV Index:   32 LVOT Area:     2.54 cm  RIGHT VENTRICLE RV Basal diam:  2.83 cm RV S prime:     20.20 cm/s TAPSE (M-mode): 2.8 cm LEFT ATRIUM             Index        RIGHT ATRIUM           Index LA diam:        3.20 cm 1.74 cm/m   RA Area:     15.30 cm LA Vol (A2C):   53.0 ml 28.74 ml/m  RA Volume:   36.80 ml  19.95 ml/m LA Vol (A4C):   35.0 ml 18.98 ml/m LA Biplane Vol: 44.8 ml 24.29 ml/m  AORTIC VALVE AV Area (Vmax):    2.03 cm AV Area (Vmean):   1.92 cm AV Area (VTI):     1.95 cm AV Vmax:           163.00 cm/s AV Vmean:          128.000 cm/s AV VTI:  0.305 m AV Peak Grad:      10.6 mmHg AV Mean Grad:      7.0 mmHg LVOT Vmax:         130.00 cm/s LVOT Vmean:        96.500 cm/s LVOT VTI:          0.234 m LVOT/AV VTI ratio: 0.77  AORTA Ao Root diam: 3.00 cm MITRAL VALVE MV Area (PHT): 4.71 cm     SHUNTS MV Decel Time: 161 msec     Systemic VTI:  0.23 m MV E velocity: 93.60 cm/s   Systemic Diam: 1.80 cm MV A velocity: 112.00 cm/s MV E/A ratio:  0.84 Arnoldo Hooker MD Electronically signed by Arnoldo Hooker MD Signature Date/Time: 08/06/2021/12:57:11 PM    Final     Assessment and Plan  Susan Terry is a 46 y.o. female with medical history significant for cervical osteomyelitis, IV heroin use disorder, who presented to the hospital with acute onset of low back pain that started about a day prior to admission.  She has been using Percocet and IV heroin at home.  In route to the hospital, she became hypoxic and apneic and treatment and she was treated with Narcan.  MRSA bacteremia in a patient with IV drug use and history of cervical C5-C6 osteomyelitis:  --No evidence of vegetation on 2D echo.   --TEE will not change the course of rx per ID. Pt will need IV abxs 6 weeks --Continue IV vancomycin.   --Consulted with Dr. Rivka Safer and input appreciated --pain meds prn--IV Dilaudid, prn ultram and scheduled ibuprofen for few days   Acute on chronic back pain, multilevel disc bulging involving L3-S1,: MRI cervical, thoracic and lumbar spine did not show any evidence of acute infection.  Acute hypoxic respiratory failure in the setting for drug abuse and respiratory depression -- Taper off oxygen as able  Acute toxic metabolic encephalopathy (due to above): Improved  Hypokalemia: Improved  polysubstance drug abuse -- patient reports she is supposed to be admitted inpatient drug rehab program in [facility] July 19  Procedures:none Family communication :none Consults :ID CODE STATUS:  DVT Prophylaxis : Level of care: Med-Surg Status is: Inpatient Remains inpatient appropriate because: Prolong IV abxs for MRSA cervical discitis    TOTAL TIME TAKING CARE OF THIS PATIENT: 35 minutes.  >50% time spent on counselling and coordination of care  Note: This dictation was prepared with Dragon dictation along with smaller phrase technology. Any transcriptional errors that result from this process are unintentional.  Enedina Finner M.D    [facility]   CC: Primary care physician; Pcp, No

## 2021-08-08 DIAGNOSIS — F1120 Opioid dependence, uncomplicated: Secondary | ICD-10-CM | POA: Diagnosis present

## 2021-08-08 LAB — CULTURE, BLOOD (ROUTINE X 2)
Special Requests: ADEQUATE
Special Requests: ADEQUATE

## 2021-08-08 LAB — VANCOMYCIN, PEAK: Vancomycin Pk: 24 ug/mL — ABNORMAL LOW (ref 30–40)

## 2021-08-08 NOTE — Progress Notes (Signed)
Pharmacy Antibiotic Note - Follow up  Susan Terry is a 46 y.o. female admitted on 08/05/2021 with bacteremia.  Pharmacy has been consulted for Vancomycin dosing.  Plan:  Will order Vancomycin level around 6th dose on 7/13 @ 1800  Follow ID recommendations for Abx selection.  Vancomycin 1500 mg x 1 given 7/11, followed by Vancomycin 1250 mg IV Q 12 hrs.  Goal AUC 400-550. Expected AUC: 510.8 SCr used: 0.66  Pharmacy will continue to follow and will adjust abx dosing whenever warranted.   Weight: 73 kg (161 lb)  Temp (24hrs), Avg:98.8 F (37.1 C), Min:97.5 F (36.4 C), Max:102.2 F (39 C)  Recent Labs  Lab 08/05/21 1126 08/05/21 2309 08/06/21 0549 08/07/21 0216  WBC 7.8  --  11.9*  --   CREATININE 0.66  --  0.59 0.61  LATICACIDVEN 1.7 0.8  --   --      Estimated Creatinine Clearance: 88.2 mL/min (by C-G formula based on SCr of 0.61 mg/dL).    No Known Allergies  Antimicrobials this admission: 7/11 Vancomycin >>  Microbiology results: 7/10 BCx: 3 of 4 w/ MRSA (mecA/C & MREJ) 7/11 UCx: No growth  Thank you for allowing pharmacy to be a part of this patient's care.  Marvell Tamer Rodriguez-Guzman PharmD, BCPS 08/08/2021 9:37 AM

## 2021-08-08 NOTE — Progress Notes (Signed)
[facility]  - [place] at [facility]   PATIENT NAME: Susan Terry    MR#:  [id number]  DATE OF BIRTH:  01/15/1976  SUBJECTIVE:  patient keeps her eyes closed throughout my conversation. Answers all questions appropriately. No family at bedside. Complains of low back pain. Has been drinking juice and eating. No fever.  C/o back pain  VITALS:  Blood pressure 130/72, pulse 87, temperature (!) 100.4 F (38 C), temperature source Oral, resp. rate 18, weight 73 kg, SpO2 95 %.  PHYSICAL EXAMINATION:   GENERAL:  46 y.o.-year-old patient lying in the bed with no acute distress.  LUNGS: Normal breath sounds bilaterally, no wheezing, rales, rhonchi.  CARDIOVASCULAR: S1, S2 normal. No murmurs, rubs, or gallops.  ABDOMEN: Soft, nontender, nondistended. Bowel sounds present.  EXTREMITIES: multiple tattoos and scabs both upper extremity NEUROLOGIC: nonfocal  patient is alert but keeps eyes closed.  LABORATORY PANEL:  CBC Recent Labs  Lab 08/06/21 0549  WBC 11.9*  HGB 10.3*  HCT 32.8*  PLT 158     Chemistries  Recent Labs  Lab 08/05/21 1126 08/05/21 1155 08/06/21 0549 08/07/21 0216  NA 137  --  136  --   K 3.4*  --  3.7  --   CL 101  --  101  --   CO2 25  --  25  --   GLUCOSE 129*  --  104*  --   BUN 11  --  8  --   CREATININE 0.66  --  0.59 0.61  CALCIUM 9.1  --  8.2*  --   MG  --  1.7  --   --   AST 33  --   --   --   ALT 24  --   --   --   ALKPHOS 96  --   --   --   BILITOT 0.8  --   --   --     Cardiac Enzymes No results for input(s): "TROPONINI" in the last 168 hours. RADIOLOGY:  No results found.  Assessment and Plan  Allesandra Huebsch is a 45 y.o. female with medical history significant for cervical osteomyelitis, IV heroin use disorder, who presented to the hospital with acute onset of low back pain that started about a day prior to admission.  She has been using Percocet and IV heroin at home.  In route to the hospital, she became  hypoxic and apneic and treatment and she was treated with Narcan.  MRSA bacteremia in a patient with IV drug use and history of cervical C5-C6 osteomyelitis:  --No evidence of vegetation on 2D echo.   --TEE will not change the course of rx per ID. Pt will need IV abxs 6 weeks --Continue IV vancomycin.   --Consulted with Dr. Rivka Safer and input appreciated --pain meds prn--IV Dilaudid, prn ultram and scheduled ibuprofen for few days   Acute on chronic back pain, multilevel disc bulging involving L3-S1,: MRI cervical, thoracic and lumbar spine did not show any evidence of acute infection.  Acute hypoxic respiratory failure in the setting for drug abuse and respiratory depression -- Taper off oxygen as able  Acute toxic metabolic encephalopathy (due to above): Improved  Hypokalemia: Improved  polysubstance drug abuse -- patient reports she is supposed to be admitted inpatient drug rehab program in [facility] July 19  Procedures:none Family communication :none Consults :ID CODE STATUS:  DVT Prophylaxis : Level of care: Med-Surg Status is: Inpatient Remains inpatient appropriate because: Prolong  IV abxs for MRSA cervical discitis    TOTAL TIME TAKING CARE OF THIS PATIENT: 35 minutes.  >50% time spent on counselling and coordination of care  Note: This dictation was prepared with Dragon dictation along with smaller phrase technology. Any transcriptional errors that result from this process are unintentional.  Enedina Finner M.D    [facility]   CC: Primary care physician; Pcp, No

## 2021-08-09 LAB — CREATININE, SERUM
Creatinine, Ser: 0.67 mg/dL (ref 0.44–1.00)
GFR, Estimated: >60 mL/min (ref >60)

## 2021-08-09 LAB — VANCOMYCIN, TROUGH: Vancomycin Tr: 11 ug/mL — ABNORMAL LOW (ref 15–20)

## 2021-08-09 MED ORDER — SODIUM CHLORIDE 0.9 % IV SOLN
600.0000 mg | Freq: Three times a day (TID) | INTRAVENOUS | Status: DC
  Administered 2021-08-09 – 2021-08-21 (×36): 600 mg via INTRAVENOUS
  Filled 2021-08-09 (×39): qty 20

## 2021-08-09 MED ORDER — IBUPROFEN 400 MG PO TABS
400.0000 mg | ORAL_TABLET | Freq: Four times a day (QID) | ORAL | Status: DC | PRN
  Administered 2021-08-09 – 2021-08-20 (×14): 400 mg via ORAL
  Filled 2021-08-09 (×14): qty 1

## 2021-08-09 MED ORDER — HYDROMORPHONE HCL 1 MG/ML IJ SOLN
1.0000 mg | Freq: Once | INTRAMUSCULAR | Status: AC
  Administered 2021-08-09: 1 mg via INTRAVENOUS
  Filled 2021-08-09: qty 1

## 2021-08-09 MED ORDER — METHADONE HCL 5 MG PO TABS
10.0000 mg | ORAL_TABLET | Freq: Every day | ORAL | Status: DC
  Administered 2021-08-09 – 2021-08-10 (×2): 10 mg via ORAL
  Filled 2021-08-09 (×2): qty 2

## 2021-08-09 MED ORDER — VANCOMYCIN HCL IN DEXTROSE 1-5 GM/200ML-% IV SOLN
1000.0000 mg | Freq: Three times a day (TID) | INTRAVENOUS | Status: DC
  Administered 2021-08-09 – 2021-08-11 (×6): 1000 mg via INTRAVENOUS
  Filled 2021-08-09 (×7): qty 200

## 2021-08-09 MED ORDER — SODIUM CHLORIDE 0.9 % IV SOLN: INTRAVENOUS | Status: DC | PRN

## 2021-08-09 NOTE — Progress Notes (Incomplete)
ID Pt in pain and withdrawal Shaking Says pain is bad Had fever this morning Patient Vitals for the past 24 hrs:  BP Temp Temp src Pulse Resp SpO2  08/08/21 1942 137/90 98.8 F (37.1 C) Oral 89 20 100 %  08/08/21 1613 (!) 142/84 98.3 F (36.8 C) Oral 83 18 100 %  08/08/21 0949 130/72 (!) 100.4 F (38 C) Oral 87 18 95 %  08/08/21 0823 139/81 (!) 102.2 F (39 C) Oral 92 18 98 %  08/08/21 0500 (!) 149/91 (!) 97.5 F (36.4 C) -- 87 19 96 %   Chest b/l air ntry Hss1s2 Abd soft CNS non focal  Labs    Latest Ref Rng & Units 08/06/2021    5:49 AM 08/05/2021   11:26 AM 02/26/2021    2:13 PM  CBC  WBC 4.0 - 10.5 K/uL 11.9  7.8  5.9   Hemoglobin 12.0 - 15.0 g/dL 67.5  91.6  38.4   Hematocrit 36.0 - 46.0 % 32.8  36.8  33.8   Platelets 150 - 400 K/uL 158  157  151        Latest Ref Rng & Units 08/07/2021    2:16 AM 08/06/2021    5:49 AM 08/05/2021   11:26 AM  CMP  Glucose 70 - 99 mg/dL  665  993   BUN 6 - 20 mg/dL  8  11   Creatinine 5.70 - 1.00 mg/dL 1.77  9.39  0.30   Sodium 135 - 145 mmol/L  136  137   Potassium 3.5 - 5.1 mmol/L  3.7  3.4   Chloride 98 - 111 mmol/L  101  101   CO2 22 - 32 mmol/L  25  25   Calcium 8.9 - 10.3 mg/dL  8.2  9.1   Total Protein 6.5 - 8.1 g/dL   7.5   Total Bilirubin 0.3 - 1.2 mg/dL   0.8   Alkaline Phos 38 - 126 U/L   96   AST 15 - 41 U/L   33   ALT 0 - 44 U/L   24     Micro 08/05/21- MRSA bacteremia  2d echo 7/13- Valves no vegetation  Impression/recommendation   MRSA bacteremia

## 2021-08-09 NOTE — Progress Notes (Signed)
End of shift note:  Pt spiked a 103 oral temperature that improved with schedule motrin and prn tylenol. A new dose of 10 mg methadone was given. IV abx given throughout the day. Pt will continue on 6 weeks of IV abx

## 2021-08-09 NOTE — Progress Notes (Signed)
ID Pt in pain and withdrawal Shaking Says pain is bad Had fever this morning Patient Vitals for the past 24 hrs:  BP Temp Temp src Pulse Resp SpO2  08/08/21 1942 137/90 98.8 F (37.1 C) Oral 89 20 100 %  08/08/21 1613 (!) 142/84 98.3 F (36.8 C) Oral 83 18 100 %  08/08/21 0949 130/72 (!) 100.4 F (38 C) Oral 87 18 95 %  08/08/21 0823 139/81 (!) 102.2 F (39 C) Oral 92 18 98 %  08/08/21 0500 (!) 149/91 (!) 97.5 F (36.4 C) -- 87 19 96 %   Chest b/l air ntry Hss1s2 Abd soft CNS non focal  Labs    Latest Ref Rng & Units 08/06/2021    5:49 AM 08/05/2021   11:26 AM 02/26/2021    2:13 PM  CBC  WBC 4.0 - 10.5 K/uL 11.9  7.8  5.9   Hemoglobin 12.0 - 15.0 g/dL 20.2  54.2  70.6   Hematocrit 36.0 - 46.0 % 32.8  36.8  33.8   Platelets 150 - 400 K/uL 158  157  151        Latest Ref Rng & Units 08/07/2021    2:16 AM 08/06/2021    5:49 AM 08/05/2021   11:26 AM  CMP  Glucose 70 - 99 mg/dL  237  628   BUN 6 - 20 mg/dL  8  11   Creatinine 3.15 - 1.00 mg/dL 1.76  1.60  7.37   Sodium 135 - 145 mmol/L  136  137   Potassium 3.5 - 5.1 mmol/L  3.7  3.4   Chloride 98 - 111 mmol/L  101  101   CO2 22 - 32 mmol/L  25  25   Calcium 8.9 - 10.3 mg/dL  8.2  9.1   Total Protein 6.5 - 8.1 g/dL   7.5   Total Bilirubin 0.3 - 1.2 mg/dL   0.8   Alkaline Phos 38 - 126 U/L   96   AST 15 - 41 U/L   33   ALT 0 - 44 U/L   24     Micro 08/05/21- MRSA bacteremia  2d echo 7/13- Valves no vegetation   Imaging MRI There is a right facet joint effusion at C6-7, but this is likely degenerative given the degree of spurring and mild associated marrow edema. 2. Sequela of remote C5-6 discitis osteomyelitis with intervertebral ankylosis.  Lumbar spine:   1. No suspected infection. There is mild discal fluid at L4-5 but no associated findings of discitis. 2. Lumbar spine degeneration and scoliosis with moderate bilateral foraminal narrowing at L5-S1  Impression/recommendation MRSA bacteremia  Past h/o  lumbar and cervical spinal infection Shows sequale - But no acute infeciton Pt has severe low back pain and has hardware in the lumbar spime- may have to repeat the MRI  2 d echo no vegetations on any valve Continue vanco 'repeat blood culture  If fever recurs may have to add ceftaroline  Substance use- in withdrawal- recommend psych consult for methadone

## 2021-08-09 NOTE — Progress Notes (Signed)
ID Sleepy had pain and withdrawal like symptoms morning ' Patient Vitals for the past 24 hrs:  BP Temp Temp src Pulse Resp SpO2  08/09/21 0947 -- 99.6 F (37.6 C) Oral -- -- --  08/09/21 0804 138/85 (!) 103 F (39.4 C) Oral 79 18 97 %  08/09/21 0331 140/84 99.8 F (37.7 C) -- 95 20 98 %  08/08/21 1942 137/90 98.8 F (37.1 C) Oral 89 20 100 %  08/08/21 1613 (!) 142/84 98.3 F (36.8 C) Oral 83 18 100 %   Chest b/l air ntry Hss1s2 Abd soft CNS non focal Skin- multiple excoriations  Labs    Latest Ref Rng & Units 08/06/2021    5:49 AM 08/05/2021   11:26 AM 02/26/2021    2:13 PM  CBC  WBC 4.0 - 10.5 K/uL 11.9  7.8  5.9   Hemoglobin 12.0 - 15.0 g/dL 53.6  14.4  31.5   Hematocrit 36.0 - 46.0 % 32.8  36.8  33.8   Platelets 150 - 400 K/uL 158  157  151        Latest Ref Rng & Units 08/09/2021    4:03 AM 08/07/2021    2:16 AM 08/06/2021    5:49 AM  CMP  Glucose 70 - 99 mg/dL   400   BUN 6 - 20 mg/dL   8   Creatinine 8.67 - 1.00 mg/dL 6.19  5.09  3.26   Sodium 135 - 145 mmol/L   136   Potassium 3.5 - 5.1 mmol/L   3.7   Chloride 98 - 111 mmol/L   101   CO2 22 - 32 mmol/L   25   Calcium 8.9 - 10.3 mg/dL   8.2     Micro 08/08/43- MRSA bacteremia  2d echo 7/13- Valves no vegetation   Imaging MRI There is a right facet joint effusion at C6-7, but this is likely degenerative given the degree of spurring and mild associated marrow edema. 2. Sequela of remote C5-6 discitis osteomyelitis with intervertebral ankylosis.  Lumbar spine:   1. No suspected infection. There is mild discal fluid at L4-5 but no associated findings of discitis. 2. Lumbar spine degeneration and scoliosis with moderate bilateral foraminal narrowing at L5-S1  Impression/recommendation MRSA bacteremia  Past h/o lumbar and cervical spinal infection Shows sequale - But no acute infeciton Pt has severe low back pain and has hardware in the lumbar spime- may have to repeat the MRI As fever ( but no  tachycardia- could be due to withdrawal) will add ceftaroline as abridge until vanco levels are therapeutic  2 d echo no vegetations on any valve 'repeat blood culture sent    Substance use- in withdrawal- recommend psych consult for methadone  Discussed the management with Dr.Patel ID will follow her peripherally this weekend- [facility] on call for emergent issues only

## 2021-08-09 NOTE — Progress Notes (Addendum)
Pharmacy Antibiotic Note - Follow up  Susan Terry is a 46 y.o. female admitted on 08/05/2021 with MRSA bacteremia.  Pharmacy has been consulted for Vancomycin dosing. 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  Marland Kitchen  Today, 08/09/2021 Day #4 antibiotics Renal: SCr 0.67 WBC 11.9 on 7/11 + fevers  7/10 Blood Cx: MRSA Repeat Blood cx pending ECHO did not show evidence of endocarditis  Vancomycin levels checked with 7/13 18:00 dose Vancomycin 1250mg  IV q12h (dose given @ 18:03 Vancomycin peak = 24 @ 21:43 7/13 Vancomycin trough = 11 mcg/ml @ 04:03 7/14 True Cmax = 31.3, true Cmin = 8.6 for AUC 430.2  Plan: Although AUC at lower end goal, with ongoing fevers and possibly concern for epidural abscess, etc.  Will dose more aggressively for trough ~15 mcg/ml while keeping AUC 400-600 as not to increase risk of AKI.   Adjust vancomycin to 1gm IV q8h for AUC = 516 and a vancomycin peak 32 and trough 13.5 Patient previously with a trough = 15.8 on this dose in March 2022 Follow renal function and repeat levels 7/16 Note patient currently on schedule ibuprofen and PRN ketorolac.  Daily SCr ordered thru 7/16  Pharmacy will continue to follow and will adjust abx dosing whenever warranted.   Weight: 73 kg (161 lb)  Temp (24hrs), Avg:100.4 F (38 C), Min:98.3 F (36.8 C), Max:103 F (39.4 C)  Recent Labs  Lab 08/05/21 1126 08/05/21 2309 08/06/21 0549 08/07/21 0216 08/08/21 2143 08/09/21 0403  WBC 7.8  --  11.9*  --   --   --   CREATININE 0.66  --  0.59 0.61  --  0.67  LATICACIDVEN 1.7 0.8  --   --   --   --   VANCOTROUGH  --   --   --   --   --  11*  VANCOPEAK  --   --   --   --  24*  --      Estimated Creatinine Clearance: 88.2 mL/min (by C-G formula based on SCr of 0.67 mg/dL).    No Known Allergies  Antimicrobials this admission: 7/11 Vancomycin >>  Microbiology results: 7/10 BCx: 3 of 4 w/ MRSA (mecA/C & MREJ) 7/11 UCx:  No growth 7/14 Bcx  Thank you for allowing pharmacy to be a part of this patient's care.  8/14, PharmD, BCPS, BCIDP Work Cell: [phone] 08/09/2021 8:27 AM

## 2021-08-09 NOTE — Progress Notes (Signed)
[facility]  - [place] at [facility]   PATIENT NAME: Susan Terry    MR#:  [id number]  DATE OF BIRTH:  1975-07-31  SUBJECTIVE:  patient keeps her eyes closed throughout my conversation. Answers all questions appropriately. No family at bedside. Complains of low back pain. Has been drinking juice and eating.  Tmax 103 yday  C/o back pain  VITALS:  Blood pressure 132/79, pulse 68, temperature 99.8 F (37.7 C), resp. rate 18, weight 73 kg, SpO2 99 %.  PHYSICAL EXAMINATION:   GENERAL:  46 y.o.-year-old patient lying in the bed with no acute distress.  LUNGS: Normal breath sounds bilaterally, no wheezing, rales, rhonchi.  CARDIOVASCULAR: S1, S2 normal. No murmurs, rubs, or gallops.  ABDOMEN: Soft, nontender, nondistended. Bowel sounds present.  EXTREMITIES: multiple tattoos and scabs both upper extremity NEUROLOGIC: nonfocal  patient is alert but keeps eyes closed.  LABORATORY PANEL:  CBC Recent Labs  Lab 08/06/21 0549  WBC 11.9*  HGB 10.3*  HCT 32.8*  PLT 158     Chemistries  Recent Labs  Lab 08/05/21 1126 08/05/21 1155 08/06/21 0549 08/07/21 0216 08/09/21 0403  NA 137  --  136  --   --   K 3.4*  --  3.7  --   --   CL 101  --  101  --   --   CO2 25  --  25  --   --   GLUCOSE 129*  --  104*  --   --   BUN 11  --  8  --   --   CREATININE 0.66  --  0.59   < > 0.67  CALCIUM 9.1  --  8.2*  --   --   MG  --  1.7  --   --   --   AST 33  --   --   --   --   ALT 24  --   --   --   --   ALKPHOS 96  --   --   --   --   BILITOT 0.8  --   --   --   --    < > = values in this interval not displayed.    Cardiac Enzymes No results for input(s): "TROPONINI" in the last 168 hours. RADIOLOGY:  No results found.  Assessment and Plan  Susan Terry is a 46 y.o. female with medical history significant for cervical osteomyelitis, IV heroin use disorder, who presented to the hospital with acute onset of low back pain that started about a day prior to  admission.  She has been using Percocet and IV heroin at home.  In route to the hospital, she became hypoxic and apneic and treatment and she was treated with Narcan.  MRSA bacteremia in a patient with IV drug use and history of cervical C5-C6 osteomyelitis:  --No evidence of vegetation on 2D echo.   --TEE will not change the course of rx per ID. Pt will need IV abxs 6 weeks --Continue IV vancomycin.   --Consulted with Dr. Rivka Safer and input appreciated --pain meds prn--IV Dilaudid, prn ultram and scheduled ibuprofen for few days   Acute on chronic back pain, multilevel disc bulging involving L3-S1,: MRI cervical, thoracic and lumbar spine did not show any evidence of acute infection.  Acute hypoxic respiratory failure in the setting for drug abuse and respiratory depression -- Taper off oxygen as able  Acute toxic metabolic encephalopathy (due to above):  Improved  Hypokalemia: Improved  polysubstance drug abuse -- patient reports she is supposed to be admitted inpatient drug rehab program in [facility] July 19 -d/w pt will start on po Methadone (for Heroin withdrawal). Titrate dose if needed  Procedures:none Family communication :none Consults :ID CODE STATUS:  DVT Prophylaxis : Level of care: Med-Surg Status is: Inpatient Remains inpatient appropriate because: Prolong IV abxs for MRSA cervical discitis    TOTAL TIME TAKING CARE OF THIS PATIENT: 35 minutes.  >50% time spent on counselling and coordination of care  Note: This dictation was prepared with Dragon dictation along with smaller phrase technology. Any transcriptional errors that result from this process are unintentional.  Susan Terry M.D    [facility]   CC: Primary care physician; Pcp, No

## 2021-08-10 LAB — CREATININE, SERUM
Creatinine, Ser: 0.51 mg/dL (ref 0.44–1.00)
GFR, Estimated: >60 mL/min (ref >60)

## 2021-08-10 MED ORDER — CHLORHEXIDINE GLUCONATE CLOTH 2 % EX PADS
6.0000 | MEDICATED_PAD | Freq: Every day | CUTANEOUS | Status: AC
  Administered 2021-08-10: 6 via TOPICAL

## 2021-08-10 MED ORDER — METHADONE HCL 5 MG PO TABS
20.0000 mg | ORAL_TABLET | Freq: Every day | ORAL | Status: DC
  Administered 2021-08-11 – 2021-08-24 (×14): 20 mg via ORAL
  Filled 2021-08-10 (×14): qty 4

## 2021-08-10 MED ORDER — HYDROMORPHONE HCL 1 MG/ML IJ SOLN
1.0000 mg | INTRAMUSCULAR | Status: DC | PRN
  Administered 2021-08-10 – 2021-08-15 (×32): 2 mg via INTRAVENOUS
  Administered 2021-08-16 (×2): 1 mg via INTRAVENOUS
  Administered 2021-08-16 (×3): 2 mg via INTRAVENOUS
  Administered 2021-08-17 (×2): 1 mg via INTRAVENOUS
  Filled 2021-08-10 (×8): qty 2
  Filled 2021-08-10 (×2): qty 1
  Filled 2021-08-10 (×8): qty 2
  Filled 2021-08-10: qty 1
  Filled 2021-08-10 (×19): qty 2

## 2021-08-10 NOTE — Progress Notes (Signed)
[facility]  - [place] at [facility]   PATIENT NAME: Susan Terry    MR#:  [id number]  DATE OF BIRTH:  1975-07-05  SUBJECTIVE:  No fever >24 hours feels better with current pain meds. Eating better  C/o back pain  VITALS:  Blood pressure (!) 145/94, pulse 71, temperature 98.6 F (37 C), resp. rate 20, height 5\' 7"  (1.702 m), weight 71.8 kg, SpO2 94 %.  PHYSICAL EXAMINATION:   GENERAL:  46 y.o.-year-old patient lying in the bed with no acute distress.  LUNGS: Normal breath sounds bilaterally, no wheezing, rales, rhonchi.  CARDIOVASCULAR: S1, S2 normal. No murmurs, rubs, or gallops.  ABDOMEN: Soft, nontender, nondistended. Bowel sounds present.  EXTREMITIES: multiple tattoos and scabs both upper extremity NEUROLOGIC: nonfocal  patient is alert but keeps eyes closed.  LABORATORY PANEL:  CBC Recent Labs  Lab 08/06/21 0549  WBC 11.9*  HGB 10.3*  HCT 32.8*  PLT 158     Chemistries  Recent Labs  Lab 08/05/21 1126 08/05/21 1155 08/06/21 0549 08/07/21 0216 08/10/21 0725  NA 137  --  136  --   --   K 3.4*  --  3.7  --   --   CL 101  --  101  --   --   CO2 25  --  25  --   --   GLUCOSE 129*  --  104*  --   --   BUN 11  --  8  --   --   CREATININE 0.66  --  0.59   < > 0.51  CALCIUM 9.1  --  8.2*  --   --   MG  --  1.7  --   --   --   AST 33  --   --   --   --   ALT 24  --   --   --   --   ALKPHOS 96  --   --   --   --   BILITOT 0.8  --   --   --   --    < > = values in this interval not displayed.    Cardiac Enzymes No results for input(s): "TROPONINI" in the last 168 hours. RADIOLOGY:  No results found.  Assessment and Plan  Susan Terry is a 46 y.o. female with medical history significant for cervical osteomyelitis, IV heroin use disorder, who presented to the hospital with acute onset of low back pain that started about a day prior to admission.  She has been using Percocet and IV heroin at home.  In route to the hospital, she  became hypoxic and apneic and treatment and she was treated with Narcan.  MRSA bacteremia in a patient with IV drug use and history of cervical C5-C6 osteomyelitis:  --No evidence of vegetation on 2D echo.   --TEE will not change the course of rx per ID. Pt will need IV abxs 6 weeks --Continue IV vancomycin.   --Consulted with Dr. 55 and input appreciated --pain meds prn--IV Dilaudid, prn ultram   Acute on chronic back pain, multilevel disc bulging involving L3-S1,: MRI cervical, thoracic and lumbar spine did not show any evidence of acute infection.  Acute hypoxic respiratory failure in the setting for drug abuse and respiratory depression -- Taper off oxygen as able  Acute toxic metabolic encephalopathy (due to above): Improved  Hypokalemia: Improved  polysubstance drug abuse -- patient reports she is supposed to be admitted inpatient drug  rehab program in [facility] July 19 -d/w pt will start on po Methadone (for Heroin withdrawal). Titrate dose as needed  Procedures:none Family communication :none Consults :ID CODE STATUS:  DVT Prophylaxis : Level of care: Med-Surg Status is: Inpatient Remains inpatient appropriate because: Prolong IV abxs for MRSA cervical discitis    TOTAL TIME TAKING CARE OF THIS PATIENT: 35 minutes.  >50% time spent on counselling and coordination of care  Note: This dictation was prepared with Dragon dictation along with smaller phrase technology. Any transcriptional errors that result from this process are unintentional.  Enedina Finner M.D    [facility]   CC: Primary care physician; Pcp, No

## 2021-08-10 NOTE — Plan of Care (Signed)
  Problem: Safety: Goal: Ability to remain free from injury will improve Outcome: Progressing   Problem: Pain Managment: Goal: General experience of comfort will improve Outcome: Progressing   Problem: Skin Integrity: Goal: Risk for impaired skin integrity will decrease Outcome: Progressing   

## 2021-08-11 LAB — VANCOMYCIN, TROUGH: Vancomycin Tr: 30 ug/mL (ref 15–20)

## 2021-08-11 LAB — CREATININE, SERUM
Creatinine, Ser: 0.95 mg/dL (ref 0.44–1.00)
GFR, Estimated: >60 mL/min (ref >60)

## 2021-08-11 LAB — VANCOMYCIN, PEAK: Vancomycin Pk: 39 ug/mL (ref 30–40)

## 2021-08-11 MED ORDER — VANCOMYCIN HCL IN DEXTROSE 1-5 GM/200ML-% IV SOLN
1000.0000 mg | Freq: Two times a day (BID) | INTRAVENOUS | Status: DC
Start: 2021-08-12 — End: 2021-08-12

## 2021-08-11 NOTE — Progress Notes (Addendum)
Pharmacy Antibiotic Note - Follow up  Susan Terry is a 46 y.o. female admitted on 08/05/2021 with MRSA bacteremia.  Pharmacy has been consulted for Vancomycin dosing. 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  .   Vancomycin levels checked with 7/16 06:00 dose Vancomycin 1000mg  IV q8h (dose given @ 0616 Vancomycin peak = 39 @ 0902 7/16 Vancomycin trough = 30 mcg/ml @ 1309 7/16 True Cmax = 43.6, true Cmin = 27.9 for AUC 847  Plan: Vancomycin peak and trough elevated. Will adjust vancomycin dose to 1000 mg q12H. Pt was on 1250 mg q12H earlier this admission with a lower peak and trough. Scr is slightly trending up. Continue to monitor. Will dose more aggressively for trough ~15 mcg/ml while keeping AUC 400-600 as not to increase risk of AKI. Try to time all labs at one time. Patient complained of being stick multiple times.   Pharmacy will continue to follow and will adjust abx dosing whenever warranted.       Height: 5\' 7"  (170.2 cm) Weight: 71.8 kg (158 lb 4.6 oz) IBW/kg (Calculated) : 61.6  Temp (24hrs), Avg:98.8 F (37.1 C), Min:98.1 F (36.7 C), Max:100.1 F (37.8 C)  Recent Labs  Lab 08/05/21 1126 08/05/21 2309 08/06/21 0549 08/07/21 0216 08/08/21 2143 08/09/21 0403 08/10/21 0725 08/11/21 0611 08/11/21 0902 08/11/21 1309  WBC 7.8  --  11.9*  --   --   --   --   --   --   --   CREATININE 0.66  --  0.59 0.61  --  0.67 0.51 0.95  --   --   LATICACIDVEN 1.7 0.8  --   --   --   --   --   --   --   --   VANCOTROUGH  --   --   --   --   --  11*  --   --   --  30*  VANCOPEAK  --   --   --   --  24*  --   --   --  39  --      Estimated Creatinine Clearance: 74.3 mL/min (by C-G formula based on SCr of 0.95 mg/dL).    No Known Allergies  Antimicrobials this admission: 7/11 Vancomycin >>  Microbiology results: 7/10 BCx: 3 of 4 w/ MRSA (mecA/C & MREJ) 7/11 UCx: No growth 7/14 Bcx  Thank you for allowing pharmacy  to be a part of this patient's care.  9/11, PharmD, BCPS Work Cell: [phone] 08/11/2021 1:50 PM

## 2021-08-11 NOTE — Progress Notes (Signed)
MD and pharmacy notified that patient refused vancomycin peak lab this morning. Pharmacy rescheduled patients vancomycin peak and trough for later this morning.

## 2021-08-11 NOTE — Progress Notes (Signed)
[facility]  - [place] at [facility]   PATIENT NAME: Susan Terry    MR#:  [id number]  DATE OF BIRTH:  [birth year]  SUBJECTIVE:  Pt reports feels better with current pain meds. Eating better VITALS:  Blood pressure 119/78, pulse 67, temperature 98.1 F (36.7 C), resp. rate 17, height 5\' 7"  (1.702 m), weight 71.8 kg, SpO2 100 %.  PHYSICAL EXAMINATION:   GENERAL:  46 y.o.-year-old patient lying in the bed with no acute distress.  LUNGS: Normal breath sounds bilaterally, no wheezing, rales, rhonchi.  CARDIOVASCULAR: S1, S2 normal. No murmurs, rubs, or gallops.  ABDOMEN: Soft, nontender, nondistended. Bowel sounds present.  EXTREMITIES: multiple tattoos and scabs both upper extremity NEUROLOGIC: nonfocal  patient is alert but keeps eyes closed.  LABORATORY PANEL:  CBC Recent Labs  Lab 08/06/21 0549  WBC 11.9*  HGB 10.3*  HCT 32.8*  PLT 158     Chemistries  Recent Labs  Lab 08/05/21 1126 08/05/21 1155 08/06/21 0549 08/07/21 0216 08/11/21 0611  NA 137  --  136  --   --   K 3.4*  --  3.7  --   --   CL 101  --  101  --   --   CO2 25  --  25  --   --   GLUCOSE 129*  --  104*  --   --   BUN 11  --  8  --   --   CREATININE 0.66  --  0.59   < > 0.95  CALCIUM 9.1  --  8.2*  --   --   MG  --  1.7  --   --   --   AST 33  --   --   --   --   ALT 24  --   --   --   --   ALKPHOS 96  --   --   --   --   BILITOT 0.8  --   --   --   --    < > = values in this interval not displayed.    Assessment and Plan  Susan Terry is a 46 y.o. female with medical history significant for cervical osteomyelitis, IV heroin use disorder, who presented to the hospital with acute onset of low back pain that started about a day prior to admission.  She has been using Percocet and IV heroin at home.  In route to the hospital, she became hypoxic and apneic and treatment and she was treated with Narcan.  MRSA bacteremia in a patient with IV drug use and history of  cervical C5-C6 osteomyelitis:  --No evidence of vegetation on 2D echo.   --TEE will not change the course of rx per ID. Pt will need IV abxs 6 weeks --Continue IV vancomycin.   --Consulted with Dr. 55 and input appreciated --pain meds prn--IV Dilaudid, prn ultram   Acute on chronic back pain, multilevel disc bulging involving L3-S1,: MRI cervical, thoracic and lumbar spine did not show any evidence of acute infection.  Acute hypoxic respiratory failure in the setting for drug abuse and respiratory depression -- Taper off oxygen as able  Acute toxic metabolic encephalopathy (due to above): Improved  Hypokalemia: Improved  polysubstance drug abuse -- patient reports she is supposed to be admitted inpatient drug rehab program in [facility] July 19 -d/w pt will start on po Methadone (for Heroin withdrawal). Titrate dose as needed  Procedures:none Family communication :none  Consults :ID CODE STATUS:  DVT Prophylaxis : Level of care: Med-Surg Status is: Inpatient Remains inpatient appropriate because: Prolong IV abxs for MRSA cervical discitis    TOTAL TIME TAKING CARE OF THIS PATIENT: 35 minutes.  >50% time spent on counselling and coordination of care  Note: This dictation was prepared with Dragon dictation along with smaller phrase technology. Any transcriptional errors that result from this process are unintentional.  Enedina Finner M.D    [facility]   CC: Primary care physician; Pcp, No

## 2021-08-11 NOTE — TOC CM/SW Note (Signed)
Patient does not have insurance or a PCP. TOC following progress and will provide resources closer to discharge.  Charlynn Court, CSW [phone]

## 2021-08-12 LAB — BASIC METABOLIC PANEL
Anion gap: 7 (ref 5–15)
BUN: 14 mg/dL (ref 6–20)
CO2: 26 mmol/L (ref 22–32)
Calcium: 8.5 mg/dL — ABNORMAL LOW (ref 8.9–10.3)
Chloride: 104 mmol/L (ref 98–111)
Creatinine, Ser: 1.21 mg/dL — ABNORMAL HIGH (ref 0.44–1.00)
GFR, Estimated: 57 mL/min — ABNORMAL LOW (ref >60)
Glucose, Bld: 160 mg/dL — ABNORMAL HIGH (ref 70–99)
Potassium: 3.2 mmol/L — ABNORMAL LOW (ref 3.5–5.1)
Sodium: 137 mmol/L (ref 135–145)

## 2021-08-12 LAB — VANCOMYCIN, RANDOM: Vancomycin Rm: 13 ug/mL

## 2021-08-12 LAB — CK: Total CK: 8 U/L — ABNORMAL LOW (ref 38–234)

## 2021-08-12 MED ORDER — POTASSIUM CHLORIDE CRYS ER 20 MEQ PO TBCR
40.0000 meq | EXTENDED_RELEASE_TABLET | ORAL | Status: AC
  Administered 2021-08-12 (×2): 40 meq via ORAL
  Filled 2021-08-12 (×2): qty 2

## 2021-08-12 MED ORDER — SODIUM CHLORIDE 0.9 % IV SOLN
600.0000 mg | Freq: Every day | INTRAVENOUS | Status: DC
  Administered 2021-08-12 – 2021-08-13 (×2): 600 mg via INTRAVENOUS
  Filled 2021-08-12 (×2): qty 12

## 2021-08-12 NOTE — Progress Notes (Signed)
Pharmacy Antibiotic Note - Follow up  Susan Terry is a 46 y.o. female admitted on 08/05/2021 with MRSA bacteremia.  Pharmacy has been consulted for Vancomycin dosing. 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  Marland Kitchen  Today, 08/12/2021 Day #7 vancomycin and Day #3 ceftaroline Renal: SCr 1.21 (increasing over last two days) Note patient on PRN toradol 7/11-7/14 and received dose approx every 6h She was started on scheduled ibuprofen on 7/12 - 7/14 then changed to PRN WBC 11.9 on 7/11 Fevers from 7/13 and 7/14 now resolved 7/10 Blood Cx: MRSA 7/14 Repeat Blood cx are NGTD ECHO did not show evidence of endocarditis  Vancomycin random vancomycin level 7/17 Random vanco level = 13 @ 0847 7/17 Based on time from previous level, calculated half-life is 16.6 hours  Vancomycin levels with 7/16 0600 dose Vancomycin 1gm IV q8h (dose given @ 06:16) Vancomycin peak = 39 @ 09:02 7/16 Vancomycin trough = 30 @ 13:09 7/16  Vancomycin levels checked with 7/13 18:00 dose Vancomycin 1250mg  IV q12h (dose given @ 18:03 Vancomycin peak = 24 @ 21:43 7/13 Vancomycin trough = 11 mcg/ml @ 04:03 7/14 True Cmax = 31.3, true Cmin = 8.6 for AUC 430.2  Plan: Suspect SCr rise related to be on dual NSAID therapy and vancomycin, after discussing with ID, will change to daptomycin 600mg  IV q24h (8.3 mg/kg) Check CK (add on to morning labs) Follow renal function and weekly CK   Pharmacy will continue to follow and will adjust abx dosing whenever warranted.   Height: 5\' 7"  (170.2 cm) Weight: 71.8 kg (158 lb 4.6 oz) IBW/kg (Calculated) : 61.6  Temp (24hrs), Avg:98.8 F (37.1 C), Min:98.7 F (37.1 C), Max:98.9 F (37.2 C)  Recent Labs  Lab 08/05/21 1126 08/05/21 2309 08/06/21 0549 08/07/21 0216 08/08/21 2143 08/09/21 0403 08/10/21 0725 08/11/21 0611 08/11/21 0902 08/11/21 1309 08/12/21 0847  WBC 7.8  --  11.9*  --   --   --   --   --   --   --   --    CREATININE 0.66  --  0.59 0.61  --  0.67 0.51 0.95  --   --  1.21*  LATICACIDVEN 1.7 0.8  --   --   --   --   --   --   --   --   --   VANCOTROUGH  --   --   --   --   --  11*  --   --   --  30*  --   VANCOPEAK  --   --   --   --  24*  --   --   --  65  --   --   VANCORANDOM  --   --   --   --   --   --   --   --   --   --  13     Estimated Creatinine Clearance: 58.3 mL/min (A) (by C-G formula based on SCr of 1.21 mg/dL (H)).    No Known Allergies  Antimicrobials this admission: 7/11 Vancomycin >>7/17 7/14 ceftaroline >> 7/17 daptomycin >>  Microbiology results: 7/10 BCx: 3 of 4 w/ MRSA (mecA/C & MREJ) 7/11 UCx: No growth 7/14 Bcx: NGTD  Thank you for allowing pharmacy to be a part of this patient's care.  9/10, PharmD, BCPS, BCIDP Work Cell: [phone] 08/12/2021 11:26 AM

## 2021-08-12 NOTE — TOC Progression Note (Signed)
Transition of Care [facility]) - Progression Note    Patient Details  Name: Susan Terry MRN: [id number] Date of Birth: 01/08/1976  [facility] CM/SW Contact  Chapman Fitch, RN Phone Number: 08/12/2021, 1:45 PM  Clinical Narrative:      Plan to continue IV antibiotics inpatient.  No stop date as of yet       Expected Discharge Plan and Services                                                 Social Determinants of Health (SDOH) Interventions    Readmission Risk Interventions     No data to display

## 2021-08-12 NOTE — Progress Notes (Signed)
ID Pt still has pain back Withdrawal better controlled with methadone Still shaking   O/e awake and alert Patient Vitals for the past 24 hrs:  BP Temp Temp src Pulse Resp SpO2  08/12/21 1937 (!) 147/83 99.3 F (37.4 C) Oral 75 20 100 %  08/12/21 0845 121/72 98.7 F (37.1 C) Oral 86 18 100 %  08/12/21 0543 135/73 -- -- 65 17 99 %  08/11/21 2100 134/82 98.9 F (37.2 C) -- 70 16 98 %    Chest B/l air ntry [id number] Abd soft CNS non focal  Labs    Latest Ref Rng & Units 08/06/2021    5:49 AM 08/05/2021   11:26 AM 02/26/2021    2:13 PM  CBC  WBC 4.0 - 10.5 K/uL 11.9  7.8  5.9   Hemoglobin 12.0 - 15.0 g/dL 32.6  71.2  45.8   Hematocrit 36.0 - 46.0 % 32.8  36.8  33.8   Platelets 150 - 400 K/uL 158  157  151        Latest Ref Rng & Units 08/12/2021    8:47 AM 08/11/2021    6:11 AM 08/10/2021    7:25 AM  CMP  Glucose 70 - 99 mg/dL 099     BUN 6 - 20 mg/dL 14     Creatinine 8.33 - 1.00 mg/dL 8.25  0.53  9.76   Sodium 135 - 145 mmol/L 137     Potassium 3.5 - 5.1 mmol/L 3.2     Chloride 98 - 111 mmol/L 104     CO2 22 - 32 mmol/L 26     Calcium 8.9 - 10.3 mg/dL 8.5       Impression/recommendation MRSA bacteremia Because of persistent low back/neck pain- will repeat MRI with contrast as previous study was not optimal due to patient issues Because of AKI - ( combination of vanco and toradol) will change vanco to dapto  Continue ceftaroline until repeat MRI Repeat BC from 7/14 NG  Discussed the management with patient and hospitalist

## 2021-08-12 NOTE — Progress Notes (Signed)
[facility]  - [place] at [facility]   PATIENT NAME: Susan Terry    MR#:  [id number]  DATE OF BIRTH:  February 15, 1975  SUBJECTIVE:  Pt reports feels better with current pain meds. Eating better VITALS:  Blood pressure 121/72, pulse 86, temperature 98.7 F (37.1 C), temperature source Oral, resp. rate 18, height 5\' 7"  (1.702 m), weight 71.8 kg, SpO2 100 %.  PHYSICAL EXAMINATION:   GENERAL:  46 y.o.-year-old patient lying in the bed with no acute distress.  LUNGS: Normal breath sounds bilaterally, no wheezing CARDIOVASCULAR: S1, S2 normal. No murmurs, rubs, or gallops.  EXTREMITIES: multiple tattoos and scabs both upper extremity NEUROLOGIC: nonfocal  patient is alert but keeps eyes closed.  LABORATORY PANEL:  CBC Recent Labs  Lab 08/06/21 0549  WBC 11.9*  HGB 10.3*  HCT 32.8*  PLT 158     Chemistries  Recent Labs  Lab 08/12/21 0847  NA 137  K 3.2*  CL 104  CO2 26  GLUCOSE 160*  BUN 14  CREATININE 1.21*  CALCIUM 8.5*    Assessment and Plan  Susan Terry is a 46 y.o. female with medical history significant for cervical osteomyelitis, IV heroin use disorder, who presented to the hospital with acute onset of low back pain that started about a day prior to admission.  She has been using Percocet and IV heroin at home.  In route to the hospital, she became hypoxic and apneic and treatment and she was treated with Narcan.  MRSA bacteremia in a patient with IV drug use and history of cervical C5-C6 osteomyelitis:  --No evidence of vegetation on 2D echo.   --TEE will not change the course of rx per ID. Pt will need IV abxs 6 weeks --Continue IV vancomycin.   --Consulted with Dr. 55 and input appreciated --pain meds prn--IV Dilaudid, prn ultram --d/w Dr JR--recommends repeat MRI cervical and lumbar spine   Acute on chronic back pain, multilevel disc bulging involving L3-S1,: MRI cervical, thoracic and lumbar spine did not show any  evidence of acute infection.  Acute hypoxic respiratory failure in the setting for drug abuse and respiratory depression -- Taper off oxygen as able  Acute toxic metabolic encephalopathy (due to above): Improved  Hypokalemia: Improved  polysubstance drug abuse -- patient reports she is supposed to be admitted inpatient drug rehab program in [facility] July 19 -d/w pt will start on po Methadone (for Heroin withdrawal). Tolerating current dose  Procedures:none Family communication :none Consults :ID CODE STATUS:  DVT Prophylaxis : Level of care: Med-Surg Status is: Inpatient Remains inpatient appropriate because: Prolong IV abxs for MRSA cervical discitis    TOTAL TIME TAKING CARE OF THIS PATIENT: 30 minutes.  >50% time spent on counselling and coordination of care  Note: This dictation was prepared with Dragon dictation along with smaller phrase technology. Any transcriptional errors that result from this process are unintentional.  July 21 M.D    [facility]   CC: Primary care physician; Pcp, No

## 2021-08-13 ENCOUNTER — Inpatient Hospital Stay: Payer: Self-pay

## 2021-08-13 DIAGNOSIS — M4620 Osteomyelitis of vertebra, site unspecified: Secondary | ICD-10-CM

## 2021-08-13 DIAGNOSIS — M4646 Discitis, unspecified, lumbar region: Secondary | ICD-10-CM

## 2021-08-13 LAB — CREATININE, SERUM
Creatinine, Ser: 1.08 mg/dL — ABNORMAL HIGH (ref 0.44–1.00)
GFR, Estimated: >60 mL/min (ref >60)

## 2021-08-13 MED ORDER — SODIUM CHLORIDE 0.9 % IV SOLN
700.0000 mg | Freq: Every day | INTRAVENOUS | Status: DC
  Administered 2021-08-14 – 2021-08-18 (×4): 700 mg via INTRAVENOUS
  Filled 2021-08-13 (×7): qty 14

## 2021-08-13 MED ORDER — GADOBUTROL 1 MMOL/ML IV SOLN
7.0000 mL | Freq: Once | INTRAVENOUS | Status: AC | PRN
  Administered 2021-08-13: 7 mL via INTRAVENOUS

## 2021-08-13 NOTE — Progress Notes (Signed)
ID Pt is tearful because her pain meds are being withdrawn quickly She had  repeat MRI today and it shows worsening lumbar spine infeciton     No fever in 72 hrs  O/e pt in some distress Tearful Patient Vitals for the past 24 hrs:  BP Temp Temp src Pulse Resp SpO2  08/13/21 1950 110/80 98.6 F (37 C) -- 71 18 98 %  08/13/21 1613 129/73 98.7 F (37.1 C) Oral 72 16 100 %  08/13/21 0843 123/81 99.3 F (37.4 C) Oral 71 16 99 %   Chest b/l air entry Hss1s2 Abd soft Skin wounds and scars CNS non focal  Labs    Latest Ref Rng & Units 08/06/2021    5:49 AM 08/05/2021   11:26 AM 02/26/2021    2:13 PM  CBC  WBC 4.0 - 10.5 K/uL 11.9  7.8  5.9   Hemoglobin 12.0 - 15.0 g/dL 50.3  54.6  56.8   Hematocrit 36.0 - 46.0 % 32.8  36.8  33.8   Platelets 150 - 400 K/uL 158  157  151        Latest Ref Rng & Units 08/13/2021    4:08 AM 08/12/2021    8:47 AM 08/11/2021    6:11 AM  CMP  Glucose 70 - 99 mg/dL  127    BUN 6 - 20 mg/dL  14    Creatinine 5.17 - 1.00 mg/dL 0.01  7.49  4.49   Sodium 135 - 145 mmol/L  137    Potassium 3.5 - 5.1 mmol/L  3.2    Chloride 98 - 111 mmol/L  104    CO2 22 - 32 mmol/L  26    Calcium 8.9 - 10.3 mg/dL  8.5      Imaging MRI Lumbar spine   Progressive L4-L5 and L5-s1 disc edema compatible with discitis/osteo 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  Impression/recommendation  MRSA bacteremia  repeat culture neg from 08/09/21  Worsening lumbar discitis/osteo and small epidural abscess at L4-L5/L5-S1 Pt on Dual MRSA coverage- increase dapto to 10mg /kg and continue ceftaroline Neurosurgery consult  H/o L2-L3 fusion.  IVDA- polysubstance use  H/o cervical and lumbar spine infection in the past  Discussed the management with patient and care team

## 2021-08-13 NOTE — Progress Notes (Addendum)
[facility]  - [place] [facility]   PATIENT NAME: Susan Terry    MR#:  [id number]  DATE OF BIRTH:  [birth year]  SUBJECTIVE:  Pt reports feels better with current pain meds. Eating better VITALS:  Blood pressure 123/81, pulse 71, temperature 99.3 F (37.4 C), temperature source Oral, resp. rate 16, height 5\' 7"  (1.702 m), weight 71.8 kg, SpO2 99 %.  PHYSICAL EXAMINATION:   GENERAL:  46 y.o.-year-old patient lying in the bed with no acute distress.  LUNGS: Normal breath sounds bilaterally, no wheezing CARDIOVASCULAR: S1, S2 normal. No murmurs, rubs, or gallops.  EXTREMITIES: multiple tattoos and scabs both upper extremity NEUROLOGIC: nonfocal  patient is alert but keeps eyes closed.  LABORATORY PANEL:  CBC No results for input(s): "WBC", "HGB", "HCT", "PLT" in the last 168 hours.   Chemistries  Recent Labs  Lab 08/12/21 0847 08/13/21 0408  NA 137  --   K 3.2*  --   CL 104  --   CO2 26  --   GLUCOSE 160*  --   BUN 14  --   CREATININE 1.21* 1.08*  CALCIUM 8.5*  --     Assessment and Plan  Susan Terry is a 46 y.o. female with medical history significant for cervical osteomyelitis, IV heroin use disorder, who presented to the hospital with acute onset of low back pain that started about a day prior to admission.  She has been using Percocet and IV heroin at home.  In route to the hospital, she became hypoxic and apneic and treatment and she was treated with Narcan.  MRSA bacteremia in a patient with IV drug use and history of cervical C5-C6 osteomyelitis:  --No evidence of vegetation on 2D echo.   --TEE will not change the course of rx per ID. Pt will need IV abxs 6 weeks --Continue IV vancomycin.   --Consulted with Dr. 55 and input appreciated --pain meds prn--IV Dilaudid, prn ultram --d/w Dr JR--recommends repeat MRI cervical and lumbar spine  --MRI lumbar spine shows new collection of fluid worrisome for epidural abscess at  L5-S1--Dr yarborough consulted   Acute hypoxic respiratory failure in the setting for drug abuse and respiratory depression -- resolved  Acute toxic metabolic encephalopathy (due to above): resolved  polysubstance drug abuse -- patient reports she is supposed to be admitted inpatient drug rehab program in [facility] July 19 -d/w pt will start on po Methadone (for Heroin withdrawal). Tolerating current dose  Procedures:none Family communication :none Consults :ID CODE STATUS:  DVT Prophylaxis : Level of care: Med-Surg Status is: Inpatient Remains inpatient appropriate because: Prolong IV abxs for MRSA cervical discitis    TOTAL TIME TAKING CARE OF THIS PATIENT: 30 minutes.  >50% time spent on counselling and coordination of care  Note: This dictation was prepared with Dragon dictation along with smaller phrase technology. Any transcriptional errors that result from this process are unintentional.  July 21 M.D    [facility]   CC: Primary care physician; Pcp, No

## 2021-08-14 LAB — CBC WITH DIFFERENTIAL/PLATELET
Abs Immature Granulocytes: 0.07 10*3/uL (ref 0.00–0.07)
Basophils Absolute: 0 10*3/uL (ref 0.0–0.1)
Basophils Relative: 1 %
Eosinophils Absolute: 0.2 10*3/uL (ref 0.0–0.5)
Eosinophils Relative: 3 %
HCT: 34 % — ABNORMAL LOW (ref 36.0–46.0)
Hemoglobin: 10.7 g/dL — ABNORMAL LOW (ref 12.0–15.0)
Immature Granulocytes: 1 %
Lymphocytes Relative: 31 %
Lymphs Abs: 2.3 10*3/uL (ref 0.7–4.0)
MCH: 25.1 pg — ABNORMAL LOW (ref 26.0–34.0)
MCHC: 31.5 g/dL (ref 30.0–36.0)
MCV: 79.6 fL — ABNORMAL LOW (ref 80.0–100.0)
Monocytes Absolute: 0.6 10*3/uL (ref 0.1–1.0)
Monocytes Relative: 8 %
Neutro Abs: 4.1 10*3/uL (ref 1.7–7.7)
Neutrophils Relative %: 56 %
Platelets: 299 10*3/uL (ref 150–400)
RBC: 4.27 MIL/uL (ref 3.87–5.11)
RDW: 14.3 % (ref 11.5–15.5)
WBC: 7.3 10*3/uL (ref 4.0–10.5)
nRBC: 0 % (ref 0.0–0.2)

## 2021-08-14 LAB — CULTURE, BLOOD (ROUTINE X 2)
Culture: NO GROWTH
Culture: NO GROWTH

## 2021-08-14 LAB — C-REACTIVE PROTEIN: CRP: 6.1 mg/dL — ABNORMAL HIGH (ref <1.0)

## 2021-08-14 LAB — SEDIMENTATION RATE: Sed Rate: 44 mm/hr — ABNORMAL HIGH (ref 0–20)

## 2021-08-14 NOTE — Progress Notes (Signed)
PROGRESS NOTE    Susan Terry  [id number] DOB: 1975-04-02 DOA: 08/05/2021 PCP: Merryl Hacker, No    Brief Narrative:  Susan Terry is a 46 y.o. female with medical history significant for cervical osteomyelitis, IV heroin use disorder, who presented to the hospital with acute onset of low back pain that started about a day prior to admission.  She has been using Percocet and IV heroin at home.  In route to the hospital, she became hypoxic and apneic and treatment and she was treated with Narcan.  7/19 no overnight issues with  Consultants:  ID , neurosurgery   Procedures:   Antimicrobials:  Vancomycin -d/cd Daptomycin and cetaroline   Subjective: Patient sleeping this AM."  When I ask questions.  Objective: Vitals:   08/13/21 0843 08/13/21 1613 08/13/21 1950 08/14/21 0330  BP: 123/81 129/73 110/80 103/67  Pulse: 71 72 71 71  Resp: 16 16 18 18   Temp: 99.3 F (37.4 C) 98.7 F (37.1 C) 98.6 F (37 C) 98.5 F (36.9 C)  TempSrc: Oral Oral    SpO2: 99% 100% 98% 99%  Weight:      Height:        Intake/Output Summary (Last 24 hours) at 08/14/2021 0806 Last data filed at 08/14/2021 0440 Gross per 24 hour  Intake 0 ml  Output --  Net 0 ml   Filed Weights   08/05/21 2255 08/10/21 0400  Weight: 73 kg 71.8 kg    Examination: Calm, NAD, sleepy Cta no w/r Reg s1/s2 no gallop Soft benign +bs No edema Grossly intact Mood and affect appropriate in current setting     Data Reviewed: I have personally reviewed following labs and imaging studies  CBC: Recent Labs  Lab 08/14/21 0325  WBC 7.3  NEUTROABS 4.1  HGB 10.7*  HCT 34.0*  MCV 79.6*  PLT 123XX123   Basic Metabolic Panel: Recent Labs  Lab 08/09/21 0403 08/10/21 0725 08/11/21 0611 08/12/21 0847 08/13/21 0408  NA  --   --   --  137  --   K  --   --   --  3.2*  --   CL  --   --   --  104  --   CO2  --   --   --  26  --   GLUCOSE  --   --   --  160*  --   BUN  --   --   --  14  --   CREATININE  0.67 0.51 0.95 1.21* 1.08*  CALCIUM  --   --   --  8.5*  --    GFR: Estimated Creatinine Clearance: 65.3 mL/min (A) (by C-G formula based on SCr of 1.08 mg/dL (H)). Liver Function Tests: No results for input(s): "AST", "ALT", "ALKPHOS", "BILITOT", "PROT", "ALBUMIN" in the last 168 hours. No results for input(s): "LIPASE", "AMYLASE" in the last 168 hours. No results for input(s): "AMMONIA" in the last 168 hours. Coagulation Profile: No results for input(s): "INR", "PROTIME" in the last 168 hours. Cardiac Enzymes: Recent Labs  Lab 08/12/21 0847  CKTOTAL 8*   BNP (last 3 results) No results for input(s): "PROBNP" in the last 8760 hours. HbA1C: No results for input(s): "HGBA1C" in the last 72 hours. CBG: No results for input(s): "GLUCAP" in the last 168 hours. Lipid Profile: No results for input(s): "CHOL", "HDL", "LDLCALC", "TRIG", "CHOLHDL", "LDLDIRECT" in the last 72 hours. Thyroid Function Tests: No results for input(s): "TSH", "T4TOTAL", "FREET4", "T3FREE", "THYROIDAB" in  the last 72 hours. Anemia Panel: No results for input(s): "VITAMINB12", "FOLATE", "FERRITIN", "TIBC", "IRON", "RETICCTPCT" in the last 72 hours. Sepsis Labs: No results for input(s): "PROCALCITON", "LATICACIDVEN" in the last 168 hours.  Recent Results (from the past 240 hour(s))  Blood Culture (routine x 2)     Status: Abnormal   Collection Time: 08/05/21 11:45 AM   Specimen: BLOOD  Result Value Ref Range Status   Specimen Description   Final    BLOOD RIGHT ARM Performed at [facility], [address], [place], [place] [address]    Special Requests   Final    BOTTLES DRAWN AEROBIC AND ANAEROBIC Blood Culture adequate volume Performed at [facility], [address] [place], [place] [address]    Culture  Setup Time   Final    GRAM POSITIVE COCCI IN BOTH AEROBIC AND ANAEROBIC BOTTLES CRITICAL VALUE NOTED.  VALUE IS CONSISTENT WITH PREVIOUSLY REPORTED AND CALLED VALUE. Performed  at [facility], [address] [place], [place] [address]    Culture (A)  Final    STAPHYLOCOCCUS AUREUS SUSCEPTIBILITIES PERFORMED ON PREVIOUS CULTURE WITHIN THE LAST 5 DAYS. Performed at [facility], [address] [place], [place] [address]    Report Status 08/08/2021 FINAL  Final  Blood Culture (routine x 2)     Status: Abnormal   Collection Time: 08/05/21 11:45 AM   Specimen: BLOOD  Result Value Ref Range Status   Specimen Description   Final    BLOOD LEFT ANTECUBITAL Performed at [facility], [address] [place], [place] [address]    Special Requests   Final    BOTTLES DRAWN AEROBIC AND ANAEROBIC Blood Culture adequate volume Performed at [facility], [address] [place], [place] [address]    Culture  Setup Time   Final    Organism ID to follow GRAM POSITIVE COCCI IN BOTH AEROBIC AND ANAEROBIC BOTTLES CRITICAL RESULT CALLED TO, READ BACK BY AND VERIFIED WITH: NATHAN BELUTH @ [id number] ON 08/06/2021.Marland KitchenMarland KitchenTKR Performed at [facility], [address] [place], [place] [address]    Culture METHICILLIN RESISTANT STAPHYLOCOCCUS AUREUS (A)  Final   Report Status 08/08/2021 FINAL  Final   Organism ID, Bacteria METHICILLIN RESISTANT STAPHYLOCOCCUS AUREUS  Final      Susceptibility   Methicillin resistant staphylococcus aureus - MIC*    CIPROFLOXACIN >=8 RESISTANT Resistant     ERYTHROMYCIN >=8 RESISTANT Resistant     GENTAMICIN >=16 RESISTANT Resistant     OXACILLIN >=4 RESISTANT Resistant     TETRACYCLINE >=16 RESISTANT Resistant     VANCOMYCIN 1 SENSITIVE Sensitive     TRIMETH/SULFA >=320 RESISTANT Resistant     CLINDAMYCIN >=8 RESISTANT Resistant     RIFAMPIN <=0.5 SENSITIVE Sensitive     Inducible Clindamycin NEGATIVE Sensitive     * METHICILLIN RESISTANT STAPHYLOCOCCUS AUREUS  Blood Culture ID Panel (Reflexed)     Status: Abnormal   Collection Time: 08/05/21 11:45 AM  Result Value Ref Range Status   Enterococcus  faecalis NOT DETECTED NOT DETECTED Final   Enterococcus Faecium NOT DETECTED NOT DETECTED Final   Listeria monocytogenes NOT DETECTED NOT DETECTED Final   Staphylococcus species DETECTED (A) NOT DETECTED Final    Comment: CRITICAL RESULT CALLED TO, READ BACK BY AND VERIFIED WITH: NATHAN BELUTH @ 0335 ON 08/06/2021.Marland KitchenMarland KitchenTKR    Staphylococcus aureus (BCID) DETECTED (A) NOT DETECTED Final    Comment: Methicillin (oxacillin)-resistant Staphylococcus aureus (MRSA). MRSA is predictably resistant to beta-lactam  antibiotics (except ceftaroline). Preferred therapy is vancomycin unless clinically contraindicated. Patient requires contact precautions if  hospitalized. CRITICAL RESULT CALLED TO, READ BACK BY AND VERIFIED WITH: NATHAN BELUTH @ 0335 ON 08/06/2021.Marland KitchenMarland KitchenTKR    Staphylococcus epidermidis NOT DETECTED NOT DETECTED Final   Staphylococcus lugdunensis NOT DETECTED NOT DETECTED Final   Streptococcus species NOT DETECTED NOT DETECTED Final   Streptococcus agalactiae NOT DETECTED NOT DETECTED Final   Streptococcus pneumoniae NOT DETECTED NOT DETECTED Final   Streptococcus pyogenes NOT DETECTED NOT DETECTED Final   A.calcoaceticus-baumannii NOT DETECTED NOT DETECTED Final   Bacteroides fragilis NOT DETECTED NOT DETECTED Final   Enterobacterales NOT DETECTED NOT DETECTED Final   Enterobacter cloacae complex NOT DETECTED NOT DETECTED Final   Escherichia coli NOT DETECTED NOT DETECTED Final   Klebsiella aerogenes NOT DETECTED NOT DETECTED Final   Klebsiella oxytoca NOT DETECTED NOT DETECTED Final   Klebsiella pneumoniae NOT DETECTED NOT DETECTED Final   Proteus species NOT DETECTED NOT DETECTED Final   Salmonella species NOT DETECTED NOT DETECTED Final   Serratia marcescens NOT DETECTED NOT DETECTED Final   Haemophilus influenzae NOT DETECTED NOT DETECTED Final   Neisseria meningitidis NOT DETECTED NOT DETECTED Final   Pseudomonas aeruginosa NOT DETECTED NOT DETECTED Final   Stenotrophomonas  maltophilia NOT DETECTED NOT DETECTED Final   Candida albicans NOT DETECTED NOT DETECTED Final   Candida auris NOT DETECTED NOT DETECTED Final   Candida glabrata NOT DETECTED NOT DETECTED Final   Candida krusei NOT DETECTED NOT DETECTED Final   Candida parapsilosis NOT DETECTED NOT DETECTED Final   Candida tropicalis NOT DETECTED NOT DETECTED Final   Cryptococcus neoformans/gattii NOT DETECTED NOT DETECTED Final   Meth resistant mecA/C and MREJ DETECTED (A) NOT DETECTED Final    Comment: CRITICAL RESULT CALLED TO, READ BACK BY AND VERIFIED WITH: NATHAN BELUTH @ 0335 ON 08/06/2021.Marland KitchenMarland KitchenTKR Performed at [facility], [address] [place], [place] [address]   MRSA Next Gen by PCR, Nasal     Status: Abnormal   Collection Time: 08/06/21  1:17 AM   Specimen: Nasal Mucosa; Nasal Swab  Result Value Ref Range Status   MRSA by PCR Next Gen DETECTED (A) NOT DETECTED Final    Comment: RESULT CALLED TO, READ BACK BY AND VERIFIED WITH: Rosann Auerbach RN 0424 08/06/21 HNM (NOTE) The GeneXpert MRSA Assay (FDA approved for NASAL specimens only), is one component of a comprehensive MRSA colonization surveillance program. It is not intended to diagnose MRSA infection nor to guide or monitor treatment for MRSA infections. Test performance is not FDA approved in patients less than 42 years old. Performed at [facility], [address], [place], [place] [address]   Urine Culture     Status: None   Collection Time: 08/06/21  4:20 AM   Specimen: In/Out Cath Urine  Result Value Ref Range Status   Specimen Description   Final    IN/OUT CATH URINE Performed at [facility], [address], [place], [place] [address]    Special Requests   Final    NONE Performed at [facility], [address] [place], [place] [address]    Culture   Final    NO GROWTH Performed at [facility], [address] [place], [place] [address]    Report Status 08/07/2021 FINAL   Final  Culture, blood (Routine X 2) w Reflex to ID Panel     Status: None   Collection Time: 08/09/21  4:03 AM  Specimen: BLOOD  Result Value Ref Range Status   Specimen Description BLOOD LEFT AC  Final   Special Requests   Final    BOTTLES DRAWN AEROBIC ONLY Blood Culture results may not be optimal due to an excessive volume of blood received in culture bottles   Culture   Final    NO GROWTH 5 DAYS Performed at [facility], [address] [place], [place] [address]    Report Status 08/14/2021 FINAL  Final  Culture, blood (Routine X 2) w Reflex to ID Panel     Status: None   Collection Time: 08/09/21  4:03 AM   Specimen: BLOOD  Result Value Ref Range Status   Specimen Description BLOOD LEFT WRIST  Final   Special Requests   Final    IN PEDIATRIC BOTTLE Blood Culture results may not be optimal due to an excessive volume of blood received in culture bottles   Culture   Final    NO GROWTH 5 DAYS Performed at [facility], [address] [place], [place] [address]    Report Status 08/14/2021 FINAL  Final         Radiology Studies: MR CERVICAL SPINE W WO CONTRAST  Result Date: 08/13/2021 CLINICAL DATA:  Neck pain, acute, no red flags EXAM: MRI CERVICAL SPINE WITHOUT AND WITH CONTRAST TECHNIQUE: Multiplanar and multiecho pulse sequences of the cervical spine, to include the craniocervical junction and cervicothoracic junction, were obtained without and with intravenous contrast. CONTRAST:  80mL GADAVIST GADOBUTROL 1 MMOL/ML IV SOLN COMPARISON:  August 05 2021 FINDINGS: Alignment: Stable. Vertebrae: Stable vertebral body heights with sequelae of discitis/osteomyelitis at C5-C6. Mild marrow edema and enhancement associated with right C6-C7 facets. Cord: No abnormal signal.  No abnormal intrathecal enhancement. Posterior Fossa, vertebral arteries, paraspinal tissues: Mild soft tissue enhancement adjacent to right C6-C7 facets. Disc levels: No significant change since  the prior study. IMPRESSION: No evidence of discitis/osteomyelitis. Mild marrow and soft tissue edema and enhancement associated with the right C6-C7 facets. This is probably on a degenerative basis. May reflect septic arthritis if there is new bacteremia. Electronically Signed   By: Guadlupe Spanish M.D.   On: 08/13/2021 14:30   MR Lumbar Spine W Wo Contrast  Result Date: 08/13/2021 CLINICAL DATA:  Low back pain, infection suspected, positive xray/CT EXAM: MRI LUMBAR SPINE WITHOUT AND WITH CONTRAST TECHNIQUE: Multiplanar and multiecho pulse sequences of the lumbar spine were obtained without and with intravenous contrast. CONTRAST:  82mL GADAVIST GADOBUTROL 1 MMOL/ML IV SOLN COMPARISON:  None Available. FINDINGS: Segmentation:  Standard. Alignment:  Mild scoliosis. Vertebrae: L4-L5 disc and to lesser extent L5-S1 edema is mildly progressed with progression of surrounding marrow edema and enhancement of the L4 and L5 endplates. Superior S1 endplate edema appears similar. L2-L3 posterior fusion. Conus medullaris and cauda equina: Conus extends to the L1 level. Conus appears normal. There is a new peripherally enhancing 8 x 7 mm fluid collection in the left dorsal canal at L5-S1, compatible with epidural abscess. Mild ventral epidural thickening could be venous plexus at L5 versus phlegmon. Paraspinal and other soft tissues: Mild paraspinal edema at L4-L5 Disc levels: Progressive (now moderate) canal stenosis at L5-S1 due to new epidural collection described above. Otherwise, similar multilevel degenerative change, as characterized on recent prior. IMPRESSION: 1. Progressive L4-L5 and L5-S1 disc edema as well as surrounding L4, L5 and superior S1 marrow edema, compatible with discitis/osteomyelitis given the clinical history and below findings. 2. New peripherally enhancing 7 x 8 left  posterior dorsal epidural fluid collection at L5-S1, compatible with epidural abscess. Mild ventral epidural thickening at L5 could be  venous plexus versus phlegmon. 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 3. Otherwise, similar multilevel degenerative change. These results will be called to the ordering clinician or representative by the Radiologist Assistant, and communication documented in the PACS or [employer]. Electronically Signed   By: Margaretha Sheffield M.D.   On: 08/13/2021 14:20        Scheduled Meds:  enoxaparin (LOVENOX) injection  40 mg Subcutaneous Q24H   lidocaine  1 patch Transdermal Q24H   methadone  20 mg Oral Daily   pregabalin  25 mg Oral BID   thiamine  100 mg Oral Daily   Continuous Infusions:  sodium chloride Stopped (08/10/21 1007)   ceFTAROline (TEFLARO) IV 600 mg (08/14/21 [id number])   DAPTOmycin (CUBICIN) 700 mg in sodium chloride 0.9 % IVPB      Assessment & Plan:   Principal Problem:   Altered mental status Active Problems:   Acute respiratory failure with hypoxia (HCC)   Back pain   Hypokalemia   Bacteremia due to methicillin resistant Staphylococcus aureus   Polysubstance dependence including opioid type drug, continuous use (HCC)  MRSA bacteremia in a patient with IV drug use and history of cervical C5-C6 osteomyelitis:  --No evidence of vegetation on 2D echo.   --TEE will not change the course of rx per ID. Pt will need IV abxs 6 weeks 7/19 ID following IV vancomycin was discontinued started on daptomycin and Ceftaroline Repeat MRI shows new collection of fluid - Neurosurgery consulted per Dr. Tillie Fantasia chat it does not look like implants are involved    Acute hypoxic respiratory failure in the setting for drug abuse and respiratory depression resolved  Acute toxic metabolic encephalopathy (due to above):  Resolved   polysubstance drug abuse -- patient reports she is supposed to be admitted inpatient drug rehab program in [facility] July 19 -d/w pt will start on po Methadone (for Heroin withdrawal).  7/19 tolerating methadone   DVT  prophylaxis: Lovenox  Code Status: Full Family Communication: None at bedside Disposition Plan: Back home Status is: Inpatient Remains inpatient appropriate because: IV treatment, needs prolonged IV antibiotics for MRSA cervical discitis.  Patient with history of drug abuse        LOS: 8 days   Time spent: 35 min    Nolberto Hanlon, MD [facility] Pager [phone]  If 7PM-7AM, please contact night-coverage 08/14/2021, 8:06 AM

## 2021-08-14 NOTE — H&P (View-Only) (Signed)
Referring Physician:  No referring provider defined for this encounter.  Primary Physician:  Pcp, No  History of Present Illness: 08/14/2021 Susan Terry is here today with a chief complaint of back pain.  She has recurrent discitis osteomyelitis with epidural component.  She has MRSA.   She has no weakness or bowel and bladder issues.   The symptoms are causing a significant impact on the patient's life.   Review of Systems:  A 10 point review of systems is negative, except for the pertinent positives and negatives detailed in the HPI.  Past Medical History: Past Medical History:  Diagnosis Date   Drug abuse (HCC)    Osteomyelitis (HCC)     Past Surgical History: Past Surgical History:  Procedure Laterality Date   BACK SURGERY     INCISION AND DRAINAGE FOREARM / WRIST DEEP Right     Allergies: Allergies as of 08/05/2021   (No Known Allergies)    Medications: No outpatient medications have been marked as taking for the 08/05/21 encounter [facility] Encounter).    Social History: Social History   Tobacco Use   Smoking status: Every Day    Packs/day: 0.50    Types: Cigarettes   Smokeless tobacco: Never  Vaping Use   Vaping Use: Former  Substance Use Topics   Alcohol use: No   Drug use: Not Currently    Types: Methamphetamines, IV    Comment: heroin    Family Medical History: Family History  Problem Relation Age of Onset   Cancer Mother        cancer throughout family    Physical Examination: Vitals:   08/14/21 0330 08/14/21 0812  BP: 103/67 100/63  Pulse: 71 66  Resp: 18   Temp: 98.5 F (36.9 C) 98.2 F (36.8 C)  SpO2: 99% 98%    General: Patient is well developed, well nourished, calm, collected, and in no apparent distress. Attention to examination is appropriate.  Neck:   Supple.  Full range of motion.  Respiratory: Patient is breathing without any difficulty.   NEUROLOGICAL:     Awake, alert, oriented to person, place, and  time.  Speech is clear and fluent. Fund of knowledge is appropriate.   Cranial Nerves: Pupils equal round and reactive to light.  Facial tone is symmetric.  Facial sensation is symmetric. Shoulder shrug is symmetric. Tongue protrusion is midline.  There is no pronator drift.  ROM of spine: full.    Strength: Side Biceps Triceps Deltoid Interossei Grip Wrist Ext. Wrist Flex.  R 5 5 5 5 5 5 5   L 5 5 5 5 5 5 5    Side Iliopsoas Quads Hamstring PF DF EHL  R 5 5 5 5 5 5   L 5 5 5 5 5 5    Reflexes are 1+ and symmetric at the biceps, triceps, brachioradialis, patella and achilles.   Hoffman's is absent.  Clonus is not present.  Toes are down-going.  Bilateral upper and lower extremity sensation is intact to light touch.    No evidence of dysmetria noted.  Gait is untested.    Medical Decision Making  Imaging: MRI L spine 08/13/2021 IMPRESSION: 1. Progressive L4-L5 and L5-S1 disc edema as well as surrounding L4, L5 and superior S1 marrow edema, compatible with discitis/osteomyelitis given the clinical history and below findings. 2. New peripherally enhancing 7 x 8 left posterior dorsal epidural fluid collection at L5-S1, compatible with epidural abscess. Mild ventral epidural thickening at L5 could be venous plexus  versus phlegmon. 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 3. Otherwise, similar multilevel degenerative change.   These results will be called to the ordering clinician or representative by the Radiologist Assistant, and communication documented in the PACS or [employer].     Electronically Signed   By: Feliberto Harts M.D.   On: 08/13/2021 14:20  I have personally reviewed the images and agree with the above interpretation.  Assessment and Plan: Susan Terry is a pleasant 46 y.o. female with worsening imaging findings from MRSA discitis/osteomyelitis/epidural abscess.  I have discussed the case with Dr. Rivka Safer, who recommended in  favor of washout of the epidural component given worsening on imaging and MRSA etiology.    I agree with washout, and have recommended as much to the patient.  I discussed the planned procedure at length with the patient, including the risks, benefits, alternatives, and indications. The risks discussed include but are not limited to bleeding, infection, need for reoperation, spinal fluid leak, stroke, vision loss, anesthetic complication, coma, paralysis, and even death. I also described in detail that improvement was not guaranteed.  The patient expressed understanding of these risks, and will make a decision over the next day or so about whether to proceed. I described the surgery in layman's terms, and gave ample opportunity for questions, which were answered to the best of my ability.      Basilia Stuckert K. Myer Haff MD, [facility] Neurosurgery

## 2021-08-14 NOTE — Consult Note (Signed)
  Referring Physician:  No referring provider defined for this encounter.  Primary Physician:  Pcp, No  History of Present Illness: 08/14/2021 [name] is here today with a chief complaint of back pain.  She has recurrent discitis osteomyelitis with epidural component.  She has MRSA.   She has no weakness or bowel and bladder issues.   The symptoms are causing a significant impact on the patient's life.   Review of Systems:  A 10 point review of systems is negative, except for the pertinent positives and negatives detailed in the HPI.  Past Medical History: Past Medical History:  Diagnosis Date   Drug abuse (HCC)    Osteomyelitis (HCC)     Past Surgical History: Past Surgical History:  Procedure Laterality Date   BACK SURGERY     INCISION AND DRAINAGE FOREARM / WRIST DEEP Right     Allergies: Allergies as of 08/05/2021   (No Known Allergies)    Medications: No outpatient medications have been marked as taking for the 08/05/21 encounter (Hospital Encounter).    Social History: Social History   Tobacco Use   Smoking status: Every Day    Packs/day: 0.50    Types: Cigarettes   Smokeless tobacco: Never  Vaping Use   Vaping Use: Former  Substance Use Topics   Alcohol use: No   Drug use: Not Currently    Types: Methamphetamines, IV    Comment: heroin    Family Medical History: Family History  Problem Relation Age of Onset   Cancer Mother        cancer throughout family    Physical Examination: Vitals:   08/14/21 0330 08/14/21 0812  BP: 103/67 100/63  Pulse: 71 66  Resp: 18   Temp: 98.5 F (36.9 C) 98.2 F (36.8 C)  SpO2: 99% 98%    General: Patient is well developed, well nourished, calm, collected, and in no apparent distress. Attention to examination is appropriate.  Neck:   Supple.  Full range of motion.  Respiratory: Patient is breathing without any difficulty.   NEUROLOGICAL:     Awake, alert, oriented to person, place, and  time.  Speech is clear and fluent. Fund of knowledge is appropriate.   Cranial Nerves: Pupils equal round and reactive to light.  Facial tone is symmetric.  Facial sensation is symmetric. Shoulder shrug is symmetric. Tongue protrusion is midline.  There is no pronator drift.  ROM of spine: full.    Strength: Side Biceps Triceps Deltoid Interossei Grip Wrist Ext. Wrist Flex.  R 5 5 5 5 5 5 5  L 5 5 5 5 5 5 5   Side Iliopsoas Quads Hamstring PF DF EHL  R 5 5 5 5 5 5  L 5 5 5 5 5 5   Reflexes are 1+ and symmetric at the biceps, triceps, brachioradialis, patella and achilles.   Hoffman's is absent.  Clonus is not present.  Toes are down-going.  Bilateral upper and lower extremity sensation is intact to light touch.    No evidence of dysmetria noted.  Gait is untested.    Medical Decision Making  Imaging: MRI L spine 08/13/2021 IMPRESSION: 1. Progressive L4-L5 and L5-S1 disc edema as well as surrounding L4, L5 and superior S1 marrow edema, compatible with discitis/osteomyelitis given the clinical history and below findings. 2. New peripherally enhancing 7 x 8 left posterior dorsal epidural fluid collection at L5-S1, compatible with epidural abscess. Mild ventral epidural thickening at L5 could be venous plexus   versus phlegmon. 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 3. Otherwise, similar multilevel degenerative change.   These results will be called to the ordering clinician or representative by the Radiologist Assistant, and communication documented in the PACS or [employer].     Electronically Signed   By: Feliberto Harts M.D.   On: 08/13/2021 14:20  I have personally reviewed the images and agree with the above interpretation.  Assessment and Plan: [name] is a pleasant 46 y.o. female with worsening imaging findings from MRSA discitis/osteomyelitis/epidural abscess.  I have discussed the case with Dr. Rivka Safer, who recommended in  favor of washout of the epidural component given worsening on imaging and MRSA etiology.    I agree with washout, and have recommended as much to the patient.  I discussed the planned procedure at length with the patient, including the risks, benefits, alternatives, and indications. The risks discussed include but are not limited to bleeding, infection, need for reoperation, spinal fluid leak, stroke, vision loss, anesthetic complication, coma, paralysis, and even death. I also described in detail that improvement was not guaranteed.  The patient expressed understanding of these risks, and will make a decision over the next day or so about whether to proceed. I described the surgery in layman's terms, and gave ample opportunity for questions, which were answered to the best of my ability.      [name] K. Myer Haff MD, [facility] Neurosurgery

## 2021-08-15 DIAGNOSIS — M4626 Osteomyelitis of vertebra, lumbar region: Secondary | ICD-10-CM

## 2021-08-15 NOTE — Progress Notes (Signed)
PROGRESS NOTE    Susan Terry  JJH:[id number] DOB: 1975-08-21 DOA: 08/05/2021 PCP: Oneita Hurt, No    Brief Narrative:  Susan Terry is a 46 y.o. female with medical history significant for cervical osteomyelitis, IV heroin use disorder, who presented to the hospital with acute onset of low back pain that started about a day prior to admission.  She has been using Percocet and IV heroin at home.  In route to the hospital, she became hypoxic and apneic and treatment and she was treated with Narcan.  7/20 refused lovenox. C/o back pain, states agreeable to washout to be done by neurosurgery.  Consultants:  ID , neurosurgery   Procedures:   Antimicrobials:  Vancomycin -d/cd Daptomycin and cetaroline   Subject: As above Objective: Vitals:   08/14/21 1658 08/14/21 2121 08/15/21 0500 08/15/21 0744  BP: 109/67 115/78 (!) 87/49 98/62  Pulse: 68 77 (!) 59 (!) 55  Resp: 18 20 16    Temp: 98.4 F (36.9 C)  97.8 F (36.6 C) 97.9 F (36.6 C)  TempSrc:   Oral Oral  SpO2: 98% 100% 97% 100%  Weight:      Height:        Intake/Output Summary (Last 24 hours) at 08/15/2021 0815 Last data filed at 08/15/2021 0620 Gross per 24 hour  Intake 839.54 ml  Output --  Net 839.54 ml   Filed Weights   08/05/21 2255 08/10/21 0400  Weight: 73 kg 71.8 kg    Examination: Calm, NAD,  Cta no w/r Reg s1/s2 no gallop Soft benign +bs No edema Grossly intact Mood and affect appropriate in current setting     Data Reviewed: I have personally reviewed following labs and imaging studies  CBC: Recent Labs  Lab 08/14/21 0325  WBC 7.3  NEUTROABS 4.1  HGB 10.7*  HCT 34.0*  MCV 79.6*  PLT 299   Basic Metabolic Panel: Recent Labs  Lab 08/09/21 0403 08/10/21 0725 08/11/21 0611 08/12/21 0847 08/13/21 0408  NA  --   --   --  137  --   K  --   --   --  3.2*  --   CL  --   --   --  104  --   CO2  --   --   --  26  --   GLUCOSE  --   --   --  160*  --   BUN  --   --   --  14  --    CREATININE 0.67 0.51 0.95 1.21* 1.08*  CALCIUM  --   --   --  8.5*  --    GFR: Estimated Creatinine Clearance: 65.3 mL/min (A) (by C-G formula based on SCr of 1.08 mg/dL (H)). Liver Function Tests: No results for input(s): "AST", "ALT", "ALKPHOS", "BILITOT", "PROT", "ALBUMIN" in the last 168 hours. No results for input(s): "LIPASE", "AMYLASE" in the last 168 hours. No results for input(s): "AMMONIA" in the last 168 hours. Coagulation Profile: No results for input(s): "INR", "PROTIME" in the last 168 hours. Cardiac Enzymes: Recent Labs  Lab 08/12/21 0847  CKTOTAL 8*   BNP (last 3 results) No results for input(s): "PROBNP" in the last 8760 hours. HbA1C: No results for input(s): "HGBA1C" in the last 72 hours. CBG: No results for input(s): "GLUCAP" in the last 168 hours. Lipid Profile: No results for input(s): "CHOL", "HDL", "LDLCALC", "TRIG", "CHOLHDL", "LDLDIRECT" in the last 72 hours. Thyroid Function Tests: No results for input(s): "TSH", "T4TOTAL", "FREET4", "T3FREE", "  THYROIDAB" in the last 72 hours. Anemia Panel: No results for input(s): "VITAMINB12", "FOLATE", "FERRITIN", "TIBC", "IRON", "RETICCTPCT" in the last 72 hours. Sepsis Labs: No results for input(s): "PROCALCITON", "LATICACIDVEN" in the last 168 hours.  Recent Results (from the past 240 hour(s))  Blood Culture (routine x 2)     Status: Abnormal   Collection Time: 08/05/21 11:45 AM   Specimen: BLOOD  Result Value Ref Range Status   Specimen Description   Final    BLOOD RIGHT ARM Performed at [facility], [address], [place], [place] [address]    Special Requests   Final    BOTTLES DRAWN AEROBIC AND ANAEROBIC Blood Culture adequate volume Performed at [facility], [address] [place], [place] [address]    Culture  Setup Time   Final    GRAM POSITIVE COCCI IN BOTH AEROBIC AND ANAEROBIC BOTTLES CRITICAL VALUE NOTED.  VALUE IS CONSISTENT WITH PREVIOUSLY REPORTED AND CALLED  VALUE. Performed at [facility], [address] [place], [place] [address]    Culture (A)  Final    STAPHYLOCOCCUS AUREUS SUSCEPTIBILITIES PERFORMED ON PREVIOUS CULTURE WITHIN THE LAST 5 DAYS. Performed at [facility], [address] [place], [place] [address]    Report Status 08/08/2021 FINAL  Final  Blood Culture (routine x 2)     Status: Abnormal   Collection Time: 08/05/21 11:45 AM   Specimen: BLOOD  Result Value Ref Range Status   Specimen Description   Final    BLOOD LEFT ANTECUBITAL Performed at [facility], [address] [place], [place] [address]    Special Requests   Final    BOTTLES DRAWN AEROBIC AND ANAEROBIC Blood Culture adequate volume Performed at [facility], [address] [place], [place] [address]    Culture  Setup Time   Final    Organism ID to follow GRAM POSITIVE COCCI IN BOTH AEROBIC AND ANAEROBIC BOTTLES CRITICAL RESULT CALLED TO, READ BACK BY AND VERIFIED WITH: NATHAN BELUTH @ 0335 ON 08/06/2021.Marland KitchenMarland KitchenTKR Performed at [facility], [address] [place], [place] [address]    Culture METHICILLIN RESISTANT STAPHYLOCOCCUS AUREUS (A)  Final   Report Status 08/08/2021 FINAL  Final   Organism ID, Bacteria METHICILLIN RESISTANT STAPHYLOCOCCUS AUREUS  Final      Susceptibility   Methicillin resistant staphylococcus aureus - MIC*    CIPROFLOXACIN >=8 RESISTANT Resistant     ERYTHROMYCIN >=8 RESISTANT Resistant     GENTAMICIN >=16 RESISTANT Resistant     OXACILLIN >=4 RESISTANT Resistant     TETRACYCLINE >=16 RESISTANT Resistant     VANCOMYCIN 1 SENSITIVE Sensitive     TRIMETH/SULFA >=320 RESISTANT Resistant     CLINDAMYCIN >=8 RESISTANT Resistant     RIFAMPIN <=0.5 SENSITIVE Sensitive     Inducible Clindamycin NEGATIVE Sensitive     * METHICILLIN RESISTANT STAPHYLOCOCCUS AUREUS  Blood Culture ID Panel (Reflexed)     Status: Abnormal   Collection Time: 08/05/21 11:45 AM  Result Value Ref Range Status    Enterococcus faecalis NOT DETECTED NOT DETECTED Final   Enterococcus Faecium NOT DETECTED NOT DETECTED Final   Listeria monocytogenes NOT DETECTED NOT DETECTED Final   Staphylococcus species DETECTED (A) NOT DETECTED Final    Comment: CRITICAL RESULT CALLED TO, READ BACK BY AND VERIFIED WITH: NATHAN BELUTH @ 0335 ON 08/06/2021.Marland KitchenMarland KitchenTKR    Staphylococcus aureus (BCID) DETECTED (A) NOT DETECTED Final    Comment: Methicillin (oxacillin)-resistant Staphylococcus aureus (MRSA). MRSA is predictably resistant  to beta-lactam antibiotics (except ceftaroline). Preferred therapy is vancomycin unless clinically contraindicated. Patient requires contact precautions if  hospitalized. CRITICAL RESULT CALLED TO, READ BACK BY AND VERIFIED WITH: NATHAN BELUTH @ 0335 ON 08/06/2021.Marland Kitchen.Marland Kitchen.TKR    Staphylococcus epidermidis NOT DETECTED NOT DETECTED Final   Staphylococcus lugdunensis NOT DETECTED NOT DETECTED Final   Streptococcus species NOT DETECTED NOT DETECTED Final   Streptococcus agalactiae NOT DETECTED NOT DETECTED Final   Streptococcus pneumoniae NOT DETECTED NOT DETECTED Final   Streptococcus pyogenes NOT DETECTED NOT DETECTED Final   A.calcoaceticus-baumannii NOT DETECTED NOT DETECTED Final   Bacteroides fragilis NOT DETECTED NOT DETECTED Final   Enterobacterales NOT DETECTED NOT DETECTED Final   Enterobacter cloacae complex NOT DETECTED NOT DETECTED Final   Escherichia coli NOT DETECTED NOT DETECTED Final   Klebsiella aerogenes NOT DETECTED NOT DETECTED Final   Klebsiella oxytoca NOT DETECTED NOT DETECTED Final   Klebsiella pneumoniae NOT DETECTED NOT DETECTED Final   Proteus species NOT DETECTED NOT DETECTED Final   Salmonella species NOT DETECTED NOT DETECTED Final   Serratia marcescens NOT DETECTED NOT DETECTED Final   Haemophilus influenzae NOT DETECTED NOT DETECTED Final   Neisseria meningitidis NOT DETECTED NOT DETECTED Final   Pseudomonas aeruginosa NOT DETECTED NOT DETECTED Final    Stenotrophomonas maltophilia NOT DETECTED NOT DETECTED Final   Candida albicans NOT DETECTED NOT DETECTED Final   Candida auris NOT DETECTED NOT DETECTED Final   Candida glabrata NOT DETECTED NOT DETECTED Final   Candida krusei NOT DETECTED NOT DETECTED Final   Candida parapsilosis NOT DETECTED NOT DETECTED Final   Candida tropicalis NOT DETECTED NOT DETECTED Final   Cryptococcus neoformans/gattii NOT DETECTED NOT DETECTED Final   Meth resistant mecA/C and MREJ DETECTED (A) NOT DETECTED Final    Comment: CRITICAL RESULT CALLED TO, READ BACK BY AND VERIFIED WITH: NATHAN BELUTH @ 0335 ON 08/06/2021.Marland Kitchen.Marland Kitchen.TKR Performed at [facility], [address] [place], [place] [address]   MRSA Next Gen by PCR, Nasal     Status: Abnormal   Collection Time: 08/06/21  1:17 AM   Specimen: Nasal Mucosa; Nasal Swab  Result Value Ref Range Status   MRSA by PCR Next Gen DETECTED (A) NOT DETECTED Final    Comment: RESULT CALLED TO, READ BACK BY AND VERIFIED WITH: Rosann AuerbachMANDA MINNATEE RN 0424 08/06/21 HNM (NOTE) The GeneXpert MRSA Assay (FDA approved for NASAL specimens only), is one component of a comprehensive MRSA colonization surveillance program. It is not intended to diagnose MRSA infection nor to guide or monitor treatment for MRSA infections. Test performance is not FDA approved in patients less than 44 years old. Performed at [facility], [address] [place], [place] [address]   Urine Culture     Status: None   Collection Time: 08/06/21  4:20 AM   Specimen: In/Out Cath Urine  Result Value Ref Range Status   Specimen Description   Final    IN/OUT CATH URINE Performed at [facility], [address] [place], [place] [address]    Special Requests   Final    NONE Performed at [facility], [address] [place], [place] [address]    Culture   Final    NO GROWTH Performed at [facility], [address] [place], [place] [address]    Report Status  08/07/2021 FINAL  Final  Culture, blood (Routine X 2) w Reflex to ID Panel     Status: None   Collection Time: 08/09/21  4:03 AM  Specimen: BLOOD  Result Value Ref Range Status   Specimen Description BLOOD LEFT AC  Final   Special Requests   Final    BOTTLES DRAWN AEROBIC ONLY Blood Culture results may not be optimal due to an excessive volume of blood received in culture bottles   Culture   Final    NO GROWTH 5 DAYS Performed at [facility], [address], [place], [place] [address]    Report Status 08/14/2021 FINAL  Final  Culture, blood (Routine X 2) w Reflex to ID Panel     Status: None   Collection Time: 08/09/21  4:03 AM   Specimen: BLOOD  Result Value Ref Range Status   Specimen Description BLOOD LEFT WRIST  Final   Special Requests   Final    IN PEDIATRIC BOTTLE Blood Culture results may not be optimal due to an excessive volume of blood received in culture bottles   Culture   Final    NO GROWTH 5 DAYS Performed at [facility], [address] [place], [place] [address]    Report Status 08/14/2021 FINAL  Final         Radiology Studies: MR CERVICAL SPINE W WO CONTRAST  Result Date: 08/13/2021 CLINICAL DATA:  Neck pain, acute, no red flags EXAM: MRI CERVICAL SPINE WITHOUT AND WITH CONTRAST TECHNIQUE: Multiplanar and multiecho pulse sequences of the cervical spine, to include the craniocervical junction and cervicothoracic junction, were obtained without and with intravenous contrast. CONTRAST:  60mL GADAVIST GADOBUTROL 1 MMOL/ML IV SOLN COMPARISON:  August 05 2021 FINDINGS: Alignment: Stable. Vertebrae: Stable vertebral body heights with sequelae of discitis/osteomyelitis at C5-C6. Mild marrow edema and enhancement associated with right C6-C7 facets. Cord: No abnormal signal.  No abnormal intrathecal enhancement. Posterior Fossa, vertebral arteries, paraspinal tissues: Mild soft tissue enhancement adjacent to right C6-C7 facets. Disc levels: No  significant change since the prior study. IMPRESSION: No evidence of discitis/osteomyelitis. Mild marrow and soft tissue edema and enhancement associated with the right C6-C7 facets. This is probably on a degenerative basis. May reflect septic arthritis if there is new bacteremia. Electronically Signed   By: Guadlupe Spanish M.D.   On: 08/13/2021 14:30   MR Lumbar Spine W Wo Contrast  Result Date: 08/13/2021 CLINICAL DATA:  Low back pain, infection suspected, positive xray/CT EXAM: MRI LUMBAR SPINE WITHOUT AND WITH CONTRAST TECHNIQUE: Multiplanar and multiecho pulse sequences of the lumbar spine were obtained without and with intravenous contrast. CONTRAST:  41mL GADAVIST GADOBUTROL 1 MMOL/ML IV SOLN COMPARISON:  None Available. FINDINGS: Segmentation:  Standard. Alignment:  Mild scoliosis. Vertebrae: L4-L5 disc and to lesser extent L5-S1 edema is mildly progressed with progression of surrounding marrow edema and enhancement of the L4 and L5 endplates. Superior S1 endplate edema appears similar. L2-L3 posterior fusion. Conus medullaris and cauda equina: Conus extends to the L1 level. Conus appears normal. There is a new peripherally enhancing 8 x 7 mm fluid collection in the left dorsal canal at L5-S1, compatible with epidural abscess. Mild ventral epidural thickening could be venous plexus at L5 versus phlegmon. Paraspinal and other soft tissues: Mild paraspinal edema at L4-L5 Disc levels: Progressive (now moderate) canal stenosis at L5-S1 due to new epidural collection described above. Otherwise, similar multilevel degenerative change, as characterized on recent prior. IMPRESSION: 1. Progressive L4-L5 and L5-S1 disc edema as well as surrounding L4, L5 and superior S1 marrow edema, compatible with discitis/osteomyelitis given the clinical history and below findings. 2. New peripherally enhancing 7 x 8 left  posterior dorsal epidural fluid collection at L5-S1, compatible with epidural abscess. Mild ventral epidural  thickening at L5 could be venous plexus versus phlegmon. 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 3. Otherwise, similar multilevel degenerative change. These results will be called to the ordering clinician or representative by the Radiologist Assistant, and communication documented in the PACS or [employer]. Electronically Signed   By: Feliberto Harts M.D.   On: 08/13/2021 14:20        Scheduled Meds:  enoxaparin (LOVENOX) injection  40 mg Subcutaneous Q24H   lidocaine  1 patch Transdermal Q24H   methadone  20 mg Oral Daily   pregabalin  25 mg Oral BID   thiamine  100 mg Oral Daily   Continuous Infusions:  sodium chloride Stopped (08/10/21 1007)   ceFTAROline (TEFLARO) IV 100 mL/hr at 08/15/21 0620   DAPTOmycin (CUBICIN) 700 mg in sodium chloride 0.9 % IVPB Stopped (08/14/21 2315)    Assessment & Plan:   Principal Problem:   Altered mental status Active Problems:   Acute respiratory failure with hypoxia (HCC)   Back pain   Hypokalemia   Bacteremia due to methicillin resistant Staphylococcus aureus   Polysubstance dependence including opioid type drug, continuous use (HCC)  MRSA bacteremia in a patient with IV drug use and history of cervical C5-C6 osteomyelitis:  --No evidence of vegetation on 2D echo.   --TEE will not change the course of rx per ID. Pt will need IV abxs 6 weeks 7/19 ID following IV vancomycin was discontinued started on daptomycin and Ceftaroline Repeat MRI shows new collection of fluid - Neurosurgery consulted per Dr. Lovenia Shuck chat it does not look like implants are involved 7/20 patient is agreeable for washout.  Dr. Marcell Barlow neurosurgery made notified N.p.o. at midnight    Acute hypoxic respiratory failure in the setting for drug abuse and respiratory depression Resolved  Acute toxic metabolic encephalopathy (due to above):  Resolved   polysubstance drug abuse -- patient reports she is supposed to be admitted  inpatient drug rehab program in [facility] July 19 -d/w pt will start on po Methadone (for Heroin withdrawal).  7/20 tolerating methadone     DVT prophylaxis: Lovenox  Code Status: Full Family Communication: None at bedside Disposition Plan: Back home Status is: Inpatient Remains inpatient appropriate because: IV treatment, needs prolonged IV antibiotics for MRSA cervical discitis.  Patient with history of drug abuse        LOS: 9 days   Time spent: 35 min    Lynn Ito, MD [facility] Pager [phone]  If 7PM-7AM, please contact night-coverage 08/15/2021, 8:15 AM

## 2021-08-15 NOTE — Progress Notes (Signed)
Patient was found vaping in room. Patient was told she cannot do that and if I see them again I will have to take them. Patient stated Dr. Allena Katz and the ID doctor have told her she can vape and no one else cares.

## 2021-08-15 NOTE — Progress Notes (Signed)
ID Pt is doing better today She will be taken for surgery by Dr.Yarborough for washout of lumbar spine infection No fever in many days  O/e looks well today cheerful Patient Vitals for the past 24 hrs:  BP Temp Temp src Pulse Resp SpO2  08/15/21 1944 112/65 98.8 F (37.1 C) -- 74 20 98 %  08/15/21 1602 104/64 98.4 F (36.9 C) -- 67 20 98 %  08/15/21 0744 98/62 97.9 F (36.6 C) Oral (!) 55 15 100 %  08/15/21 0500 (!) 87/49 97.8 F (36.6 C) Oral (!) 59 16 97 %   Chest b/l air entry [id number] Abd soft Skin wounds and scars CNS non focal  Labs    Latest Ref Rng & Units 08/14/2021    3:25 AM 08/06/2021    5:49 AM 08/05/2021   11:26 AM  CBC  WBC 4.0 - 10.5 K/uL 7.3  11.9  7.8   Hemoglobin 12.0 - 15.0 g/dL 48.8  89.1  69.4   Hematocrit 36.0 - 46.0 % 34.0  32.8  36.8   Platelets 150 - 400 K/uL 299  158  157        Latest Ref Rng & Units 08/13/2021    4:08 AM 08/12/2021    8:47 AM 08/11/2021    6:11 AM  CMP  Glucose 70 - 99 mg/dL  503    BUN 6 - 20 mg/dL  14    Creatinine 8.88 - 1.00 mg/dL 2.80  0.34  9.17   Sodium 135 - 145 mmol/L  137    Potassium 3.5 - 5.1 mmol/L  3.2    Chloride 98 - 111 mmol/L  104    CO2 22 - 32 mmol/L  26    Calcium 8.9 - 10.3 mg/dL  8.5      Imaging MRI Lumbar spine   Progressive L4-L5 and L5-s1 disc edema compatible with discitis/osteo 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  Impression/recommendation  MRSA bacteremia  repeat culture neg from 08/09/21 2 d echo from 7/11 no vegetation Worsening lumbar discitis/osteo and small epidural abscess at L4-L5/L5-S1 Pt on Dual MRSA coverage-  dapto to 10mg /kg and  ceftaroline surgery tomorrow    IVDA- polysubstance use  H/o cervical and lumbar spine infection in the past-H/o L2-L3 fusion.  Discussed the management with patient and care team

## 2021-08-16 ENCOUNTER — Encounter
Admission: EM | Discharge status: Against Medical Advice | Payer: Self-pay | Source: Home / Self Care | Attending: Internal Medicine

## 2021-08-16 ENCOUNTER — Inpatient Hospital Stay: Payer: Self-pay

## 2021-08-16 ENCOUNTER — Encounter: Payer: Self-pay | Admitting: Internal Medicine

## 2021-08-16 ENCOUNTER — Inpatient Hospital Stay: Payer: Self-pay | Admitting: Anesthesiology

## 2021-08-16 ENCOUNTER — Other Ambulatory Visit: Payer: Self-pay

## 2021-08-16 DIAGNOSIS — G061 Intraspinal abscess and granuloma: Secondary | ICD-10-CM

## 2021-08-16 HISTORY — PX: LUMBAR LAMINECTOMY FOR EPIDURAL ABSCESS: SHX5956

## 2021-08-16 LAB — URINE DRUG SCREEN, QUALITATIVE (ARMC ONLY)
Amphetamines, Ur Screen: NOT DETECTED
Barbiturates, Ur Screen: NOT DETECTED
Benzodiazepine, Ur Scrn: NOT DETECTED
Cannabinoid 50 Ng, Ur ~~LOC~~: NOT DETECTED
Cocaine Metabolite,Ur ~~LOC~~: NOT DETECTED
MDMA (Ecstasy)Ur Screen: NOT DETECTED
Methadone Scn, Ur: POSITIVE — AB
Opiate, Ur Screen: POSITIVE — AB
Phencyclidine (PCP) Ur S: NOT DETECTED
Tricyclic, Ur Screen: NOT DETECTED

## 2021-08-16 LAB — BASIC METABOLIC PANEL
Anion gap: 6 (ref 5–15)
BUN: 16 mg/dL (ref 6–20)
CO2: 27 mmol/L (ref 22–32)
Calcium: 9.2 mg/dL (ref 8.9–10.3)
Chloride: 104 mmol/L (ref 98–111)
Creatinine, Ser: 1.17 mg/dL — ABNORMAL HIGH (ref 0.44–1.00)
GFR, Estimated: 59 mL/min — ABNORMAL LOW (ref >60)
Glucose, Bld: 97 mg/dL (ref 70–99)
Potassium: 4.6 mmol/L (ref 3.5–5.1)
Sodium: 137 mmol/L (ref 135–145)

## 2021-08-16 LAB — PREGNANCY, URINE: Preg Test, Ur: NEGATIVE

## 2021-08-16 SURGERY — LUMBAR LAMINECTOMY FOR EPIDURAL ABSCESS
Anesthesia: General | Site: Back | Laterality: Left

## 2021-08-16 MED ORDER — MIDAZOLAM HCL 2 MG/2ML IJ SOLN
INTRAMUSCULAR | Status: DC | PRN
  Administered 2021-08-16 (×2): 1 mg via INTRAVENOUS

## 2021-08-16 MED ORDER — ROCURONIUM BROMIDE 10 MG/ML (PF) SYRINGE
PREFILLED_SYRINGE | INTRAVENOUS | Status: AC
  Filled 2021-08-16: qty 10

## 2021-08-16 MED ORDER — PROMETHAZINE HCL 25 MG/ML IJ SOLN: 6.2500 mg | INTRAMUSCULAR | Status: DC | PRN

## 2021-08-16 MED ORDER — MIDAZOLAM HCL 2 MG/2ML IJ SOLN
INTRAMUSCULAR | Status: AC
  Filled 2021-08-16: qty 2

## 2021-08-16 MED ORDER — DEXMEDETOMIDINE (PRECEDEX) IN NS 20 MCG/5ML (4 MCG/ML) IV SYRINGE
PREFILLED_SYRINGE | INTRAVENOUS | Status: DC | PRN
  Administered 2021-08-16: 4 ug via INTRAVENOUS
  Administered 2021-08-16: 12 ug via INTRAVENOUS
  Administered 2021-08-16: 16 ug via INTRAVENOUS

## 2021-08-16 MED ORDER — FENTANYL CITRATE (PF) 100 MCG/2ML IJ SOLN
INTRAMUSCULAR | Status: AC
  Filled 2021-08-16: qty 2

## 2021-08-16 MED ORDER — KETAMINE HCL 50 MG/5ML IJ SOSY
PREFILLED_SYRINGE | INTRAMUSCULAR | Status: AC
  Filled 2021-08-16: qty 5

## 2021-08-16 MED ORDER — LACTATED RINGERS IV SOLN: INTRAVENOUS | Status: DC | PRN

## 2021-08-16 MED ORDER — FENTANYL CITRATE (PF) 100 MCG/2ML IJ SOLN
INTRAMUSCULAR | Status: DC | PRN
  Administered 2021-08-16: 50 ug via INTRAVENOUS
  Administered 2021-08-16: 100 ug via INTRAVENOUS
  Administered 2021-08-16: 50 ug via INTRAVENOUS

## 2021-08-16 MED ORDER — SUGAMMADEX SODIUM 200 MG/2ML IV SOLN
INTRAVENOUS | Status: DC | PRN
  Administered 2021-08-16: 200 mg via INTRAVENOUS

## 2021-08-16 MED ORDER — FENTANYL CITRATE (PF) 100 MCG/2ML IJ SOLN
INTRAMUSCULAR | Status: AC
  Administered 2021-08-16: 50 ug via INTRAVENOUS
  Filled 2021-08-16: qty 2

## 2021-08-16 MED ORDER — FENTANYL CITRATE (PF) 100 MCG/2ML IJ SOLN
25.0000 ug | INTRAMUSCULAR | Status: DC | PRN
  Administered 2021-08-16 (×2): 50 ug via INTRAVENOUS

## 2021-08-16 MED ORDER — BUPIVACAINE-EPINEPHRINE (PF) 0.5% -1:200000 IJ SOLN
INTRAMUSCULAR | Status: AC
  Filled 2021-08-16: qty 30

## 2021-08-16 MED ORDER — FENTANYL CITRATE (PF) 100 MCG/2ML IJ SOLN: 50.0000 ug | Freq: Once | INTRAMUSCULAR | Status: DC

## 2021-08-16 MED ORDER — ONDANSETRON HCL 4 MG/2ML IJ SOLN
INTRAMUSCULAR | Status: AC
  Filled 2021-08-16: qty 2

## 2021-08-16 MED ORDER — BUPIVACAINE-EPINEPHRINE (PF) 0.5% -1:200000 IJ SOLN
INTRAMUSCULAR | Status: DC | PRN
  Administered 2021-08-16: 6 mL

## 2021-08-16 MED ORDER — ONDANSETRON HCL 4 MG/2ML IJ SOLN
INTRAMUSCULAR | Status: DC | PRN
  Administered 2021-08-16: 4 mg via INTRAVENOUS

## 2021-08-16 MED ORDER — LIDOCAINE HCL (PF) 2 % IJ SOLN
INTRAMUSCULAR | Status: AC
  Filled 2021-08-16: qty 5

## 2021-08-16 MED ORDER — ROCURONIUM BROMIDE 100 MG/10ML IV SOLN
INTRAVENOUS | Status: DC | PRN
  Administered 2021-08-16: 10 mg via INTRAVENOUS
  Administered 2021-08-16: 50 mg via INTRAVENOUS

## 2021-08-16 MED ORDER — 0.9 % SODIUM CHLORIDE (POUR BTL) OPTIME
TOPICAL | Status: DC | PRN
  Administered 2021-08-16: 1000 mL

## 2021-08-16 MED ORDER — HYDROMORPHONE HCL 1 MG/ML IJ SOLN
INTRAMUSCULAR | Status: AC
  Filled 2021-08-16: qty 1

## 2021-08-16 MED ORDER — KETAMINE HCL 10 MG/ML IJ SOLN
INTRAMUSCULAR | Status: DC | PRN
  Administered 2021-08-16: 30 mg via INTRAVENOUS
  Administered 2021-08-16: 20 mg via INTRAVENOUS

## 2021-08-16 MED ORDER — PROPOFOL 10 MG/ML IV BOLUS
INTRAVENOUS | Status: AC
  Filled 2021-08-16: qty 20

## 2021-08-16 MED ORDER — DEXAMETHASONE SODIUM PHOSPHATE 10 MG/ML IJ SOLN
INTRAMUSCULAR | Status: DC | PRN
  Administered 2021-08-16: 5 mg via INTRAVENOUS

## 2021-08-16 MED ORDER — LORAZEPAM 2 MG/ML IJ SOLN
INTRAMUSCULAR | Status: AC
  Administered 2021-08-16: 0.5 mg via INTRAVENOUS
  Filled 2021-08-16: qty 1

## 2021-08-16 MED ORDER — PROPOFOL 10 MG/ML IV BOLUS
INTRAVENOUS | Status: DC | PRN
  Administered 2021-08-16: 150 mg via INTRAVENOUS

## 2021-08-16 MED ORDER — LIDOCAINE HCL (CARDIAC) PF 100 MG/5ML IV SOSY
PREFILLED_SYRINGE | INTRAVENOUS | Status: DC | PRN
  Administered 2021-08-16: 70 mg via INTRAVENOUS

## 2021-08-16 MED ORDER — LORAZEPAM 2 MG/ML IJ SOLN: 0.5000 mg | Freq: Once | INTRAMUSCULAR | Status: DC

## 2021-08-16 MED ORDER — LORAZEPAM 2 MG/ML IJ SOLN
0.5000 mg | INTRAMUSCULAR | Status: AC | PRN
  Administered 2021-08-16: 0.5 mg via INTRAVENOUS

## 2021-08-16 MED ORDER — DEXAMETHASONE SODIUM PHOSPHATE 10 MG/ML IJ SOLN
INTRAMUSCULAR | Status: AC
  Filled 2021-08-16: qty 1

## 2021-08-16 SURGICAL SUPPLY — 58 items
BASIN KIT SINGLE STR (MISCELLANEOUS) ×2 IMPLANT
BUR NEURO DRILL SOFT 3.0X3.8M (BURR) ×2 IMPLANT
CHLORAPREP W/TINT 26 (MISCELLANEOUS) ×3 IMPLANT
CNTNR SPEC 2.5X3XGRAD LEK (MISCELLANEOUS) ×1
CONT SPEC 4OZ STER OR WHT (MISCELLANEOUS) ×1
CONTAINER SPEC 2.5X3XGRAD LEK (MISCELLANEOUS) ×1 IMPLANT
COUNTER NEEDLE 20/40 LG (NEEDLE) ×3 IMPLANT
CUP MEDICINE 2OZ PLAST GRAD ST (MISCELLANEOUS) ×4 IMPLANT
DERMABOND ADVANCED (GAUZE/BANDAGES/DRESSINGS) ×1
DERMABOND ADVANCED .7 DNX12 (GAUZE/BANDAGES/DRESSINGS) ×1 IMPLANT
DRAPE C ARM PK CFD 31 SPINE (DRAPES) ×2 IMPLANT
DRAPE LAPAROTOMY 100X77 ABD (DRAPES) ×2 IMPLANT
DRAPE MICROSCOPE SPINE 48X150 (DRAPES) ×2 IMPLANT
DRAPE SURG 17X11 SM STRL (DRAPES) ×2 IMPLANT
ELECT CAUTERY BLADE TIP 2.5 (TIP) ×4
ELECT EZSTD 165MM 6.5IN (MISCELLANEOUS)
ELECT REM PT RETURN 9FT ADLT (ELECTROSURGICAL) ×2
ELECTRODE CAUTERY BLDE TIP 2.5 (TIP) ×2 IMPLANT
ELECTRODE EZSTD 165MM 6.5IN (MISCELLANEOUS) IMPLANT
ELECTRODE REM PT RTRN 9FT ADLT (ELECTROSURGICAL) ×1 IMPLANT
GLOVE BIOGEL PI IND STRL 6.5 (GLOVE) ×1 IMPLANT
GLOVE BIOGEL PI IND STRL 8.5 (GLOVE) ×1 IMPLANT
GLOVE BIOGEL PI INDICATOR 6.5 (GLOVE) ×1
GLOVE BIOGEL PI INDICATOR 8.5 (GLOVE) ×1
GLOVE SURG SYN 6.5 ES PF (GLOVE) IMPLANT
GLOVE SURG SYN 6.5 PF PI (GLOVE) ×2 IMPLANT
GLOVE SURG SYN 8.5  E (GLOVE) ×3
GLOVE SURG SYN 8.5 E (GLOVE) ×3 IMPLANT
GLOVE SURG SYN 8.5 PF PI (GLOVE) ×3 IMPLANT
GOWN SRG LRG LVL 4 IMPRV REINF (GOWNS) ×1 IMPLANT
GOWN SRG XL LVL 3 NONREINFORCE (GOWNS) ×1 IMPLANT
GOWN STRL NON-REIN TWL XL LVL3 (GOWNS) ×1
GOWN STRL REIN LRG LVL4 (GOWNS) ×1
GRADUATE 1200CC STRL 31836 (MISCELLANEOUS) ×2 IMPLANT
HEMOVAC 400CC 10FR (MISCELLANEOUS) ×1 IMPLANT
KIT SPINAL PRONEVIEW (KITS) ×2 IMPLANT
MANIFOLD NEPTUNE II (INSTRUMENTS) ×2 IMPLANT
MARKER SKIN DUAL TIP RULER LAB (MISCELLANEOUS) ×4 IMPLANT
NDL SAFETY ECLIPSE 18X1.5 (NEEDLE) ×1 IMPLANT
NEEDLE HYPO 18GX1.5 SHARP (NEEDLE)
NS IRRIG 1000ML POUR BTL (IV SOLUTION) ×2 IMPLANT
PACK LAMINECTOMY NEURO (CUSTOM PROCEDURE TRAY) ×2 IMPLANT
PAD ARMBOARD 7.5X6 YLW CONV (MISCELLANEOUS) ×2 IMPLANT
SOLUTION IRRIG SURGIPHOR (IV SOLUTION) ×1 IMPLANT
SURGIFLO W/THROMBIN 8M KIT (HEMOSTASIS) ×2 IMPLANT
SUT DVC VLOC 3-0 CL 6 P-12 (SUTURE) ×2 IMPLANT
SUT ETHILON 3-0 FS-10 30 BLK (SUTURE) ×2
SUT VIC AB 0 CT1 27 (SUTURE) ×1
SUT VIC AB 0 CT1 27XCR 8 STRN (SUTURE) ×1 IMPLANT
SUT VIC AB 2-0 CT1 18 (SUTURE) ×2 IMPLANT
SUTURE EHLN 3-0 FS-10 30 BLK (SUTURE) IMPLANT
SWAB CULTURE AMIES ANAERIB BLU (MISCELLANEOUS) ×1 IMPLANT
SYR 10ML LL (SYRINGE) ×2 IMPLANT
SYR 30ML LL (SYRINGE) ×4 IMPLANT
SYR 3ML LL SCALE MARK (SYRINGE) ×1 IMPLANT
TOWEL OR 17X26 4PK STRL BLUE (TOWEL DISPOSABLE) ×6 IMPLANT
TUBING CONNECTING 10 (TUBING) ×4 IMPLANT
WATER STERILE IRR 1000ML POUR (IV SOLUTION) ×2 IMPLANT

## 2021-08-16 NOTE — Progress Notes (Signed)
PROGRESS NOTE    Susan Terry  OQH:[id number] DOB: 1975-11-29 DOA: 08/05/2021 PCP: Oneita Hurt, No    Brief Narrative:  Susan Terry is a 46 y.o. female with medical history significant for cervical osteomyelitis, IV heroin use disorder, who presented to the hospital with acute onset of low back pain that started about a day prior to admission.  She has been using Percocet and IV heroin at home.  In route to the hospital, she became hypoxic and apneic and treatment and she was treated with Narcan.  7/20 refused lovenox. C/o back pain, states agreeable to washout to be done by neurosurgery. 7/21 pt was vaping in the room last night. Plan for surgery today  Consultants:  ID , neurosurgery   Procedures:   Antimicrobials:  Vancomycin -d/cd Daptomycin and cetaroline   Subject: C/o back pain. No other new issues.   Objective: Vitals:   08/15/21 0744 08/15/21 1602 08/15/21 1944 08/16/21 0419  BP: 98/62 104/64 112/65 112/72  Pulse: (!) 55 67 74 74  Resp: 15 20 20 18   Temp: 97.9 F (36.6 C) 98.4 F (36.9 C) 98.8 F (37.1 C) 98.7 F (37.1 C)  TempSrc: Oral     SpO2: 100% 98% 98% 100%  Weight:      Height:        Intake/Output Summary (Last 24 hours) at 08/16/2021 0829 Last data filed at 08/16/2021 0314 Gross per 24 hour  Intake 436.35 ml  Output --  Net 436.35 ml   Filed Weights   08/05/21 2255 08/10/21 0400  Weight: 73 kg 71.8 kg    Examination: Calm, NAD Cta no w/r Reg s1/s2 no gallop Soft benign +bs No edema Aaoxox3  Mood and affect appropriate in current setting     Data Reviewed: I have personally reviewed following labs and imaging studies  CBC: Recent Labs  Lab 08/14/21 0325  WBC 7.3  NEUTROABS 4.1  HGB 10.7*  HCT 34.0*  MCV 79.6*  PLT 299   Basic Metabolic Panel: Recent Labs  Lab 08/10/21 0725 08/11/21 0611 08/12/21 0847 08/13/21 0408 08/16/21 0414  NA  --   --  137  --  137  K  --   --  3.2*  --  4.6  CL  --   --  104  --   104  CO2  --   --  26  --  27  GLUCOSE  --   --  160*  --  97  BUN  --   --  14  --  16  CREATININE 0.51 0.95 1.21* 1.08* 1.17*  CALCIUM  --   --  8.5*  --  9.2   GFR: Estimated Creatinine Clearance: 60.3 mL/min (A) (by C-G formula based on SCr of 1.17 mg/dL (H)). Liver Function Tests: No results for input(s): "AST", "ALT", "ALKPHOS", "BILITOT", "PROT", "ALBUMIN" in the last 168 hours. No results for input(s): "LIPASE", "AMYLASE" in the last 168 hours. No results for input(s): "AMMONIA" in the last 168 hours. Coagulation Profile: No results for input(s): "INR", "PROTIME" in the last 168 hours. Cardiac Enzymes: Recent Labs  Lab 08/12/21 0847  CKTOTAL 8*   BNP (last 3 results) No results for input(s): "PROBNP" in the last 8760 hours. HbA1C: No results for input(s): "HGBA1C" in the last 72 hours. CBG: No results for input(s): "GLUCAP" in the last 168 hours. Lipid Profile: No results for input(s): "CHOL", "HDL", "LDLCALC", "TRIG", "CHOLHDL", "LDLDIRECT" in the last 72 hours. Thyroid Function Tests: No results  for input(s): "TSH", "T4TOTAL", "FREET4", "T3FREE", "THYROIDAB" in the last 72 hours. Anemia Panel: No results for input(s): "VITAMINB12", "FOLATE", "FERRITIN", "TIBC", "IRON", "RETICCTPCT" in the last 72 hours. Sepsis Labs: No results for input(s): "PROCALCITON", "LATICACIDVEN" in the last 168 hours.  Recent Results (from the past 240 hour(s))  Culture, blood (Routine X 2) w Reflex to ID Panel     Status: None   Collection Time: 08/09/21  4:03 AM   Specimen: BLOOD  Result Value Ref Range Status   Specimen Description BLOOD LEFT AC  Final   Special Requests   Final    BOTTLES DRAWN AEROBIC ONLY Blood Culture results may not be optimal due to an excessive volume of blood received in culture bottles   Culture   Final    NO GROWTH 5 DAYS Performed at [facility], [address], [place], [place] [address]    Report Status 08/14/2021 FINAL  Final  Culture,  blood (Routine X 2) w Reflex to ID Panel     Status: None   Collection Time: 08/09/21  4:03 AM   Specimen: BLOOD  Result Value Ref Range Status   Specimen Description BLOOD LEFT WRIST  Final   Special Requests   Final    IN PEDIATRIC BOTTLE Blood Culture results may not be optimal due to an excessive volume of blood received in culture bottles   Culture   Final    NO GROWTH 5 DAYS Performed at [facility], [address] [place], [place] [address]    Report Status 08/14/2021 FINAL  Final         Radiology Studies: No results found.      Scheduled Meds:  enoxaparin (LOVENOX) injection  40 mg Subcutaneous Q24H   lidocaine  1 patch Transdermal Q24H   methadone  20 mg Oral Daily   pregabalin  25 mg Oral BID   thiamine  100 mg Oral Daily   Continuous Infusions:  sodium chloride Stopped (08/10/21 1007)   ceFTAROline (TEFLARO) IV 600 mg (08/16/21 0656)   DAPTOmycin (CUBICIN) 700 mg in sodium chloride 0.9 % IVPB Stopped (08/15/21 2112)    Assessment & Plan:   Principal Problem:   Altered mental status Active Problems:   Acute respiratory failure with hypoxia (HCC)   Back pain   Hypokalemia   Bacteremia due to methicillin resistant Staphylococcus aureus   Polysubstance dependence including opioid type drug, continuous use (HCC)  MRSA bacteremia in a patient with IV drug use and history of cervical C5-C6 osteomyelitis H/o cervical and lumbar spine infection in the past-H/o L2-L3 fusion. -No evidence of vegetation on 2D echo.   --TEE will not change the course of rx per ID. Pt will need IV abxs 6 weeks 7/19 ID following IV vancomycin was discontinued started on daptomycin and Ceftaroline Repeat MRI shows new collection of fluid - Neurosurgery consulted per Dr. Lovenia Shuck chat it does not look like implants are involved 7/21 plan for washout by Dr. Marcell Barlow today       Acute hypoxic respiratory failure in the setting for drug abuse and respiratory  depression Resolved  Acute toxic metabolic encephalopathy (due to above):  Resolved   polysubstance drug abuse -- patient reports she is supposed to be admitted inpatient drug rehab program in [facility] July 19 -d/w pt will start on po Methadone (for Heroin withdrawal).  7/21 patient found vaping in the room.  Should not be doing this  Tolerating methadone       DVT  prophylaxis: Lovenox  Code Status: Full Family Communication: None at bedside Disposition Plan: Back home Status is: Inpatient Remains inpatient appropriate because: IV treatment, needs prolonged IV antibiotics for MRSA cervical discitis.  Patient with history of drug abuse        LOS: 10 days   Time spent: 35 min    Lynn Ito, MD [facility] Pager [phone]  If 7PM-7AM, please contact night-coverage 08/16/2021, 8:29 AM

## 2021-08-16 NOTE — Interval H&P Note (Signed)
History and Physical Interval Note:  08/16/2021 5:34 PM  Susan Terry  has presented today for surgery, with the diagnosis of epidural abscess.  The various methods of treatment have been discussed with the patient and family. After consideration of risks, benefits and other options for treatment, the patient has consented to  Procedure(s): LUMBAR LAMINECTOMY FOR EPIDURAL ABSCESS (Left) as a surgical intervention.  The patient's history has been reviewed, patient examined, no change in status, stable for surgery.  I have reviewed the patient's chart and labs.  Questions were answered to the patient's satisfaction.    Heart sounds normal no MRG. Chest Clear to Auscultation Bilaterally.   [name]

## 2021-08-16 NOTE — Transfer of Care (Signed)
Immediate Anesthesia Transfer of Care Note  Patient: Susan Terry [facility]  Procedure(s) Performed: LUMBAR LAMINECTOMY FOR EPIDURAL ABSCESS (Left: Back)  Patient Location: PACU  Anesthesia Type:General  Level of Consciousness: awake  Airway & Oxygen Therapy: Patient Spontanous Breathing  Post-op Assessment: Report given to RN and Post -op Vital signs reviewed and stable  Post vital signs: Reviewed and stable  Last Vitals:  Vitals Value Taken Time  BP 108/69 08/16/21 1915  Temp 36.5 C 08/16/21 1908  Pulse 69 08/16/21 1918  Resp 13 08/16/21 1918  SpO2 99 % 08/16/21 1918  Vitals shown include unvalidated device data.  Last Pain:  Vitals:   08/16/21 1915  TempSrc:   PainSc: 10-Worst pain ever      Patients Stated Pain Goal: 7 (08/15/21 0235)  Complications: No notable events documented.

## 2021-08-16 NOTE — Anesthesia Procedure Notes (Signed)
Procedure Name: Intubation Date/Time: 08/16/2021 6:00 PM  Performed by: Tammi Klippel, CRNAPre-anesthesia Checklist: Patient identified, Patient being monitored, Timeout performed, Emergency Drugs available and Suction available Patient Re-evaluated:Patient Re-evaluated prior to induction Oxygen Delivery Method: Circle system utilized Preoxygenation: Pre-oxygenation with 100% oxygen Induction Type: IV induction Ventilation: Mask ventilation without difficulty Laryngoscope Size: 3 and McGraph Grade View: Grade I Tube type: Oral Tube size: 7.0 mm Number of attempts: 1 Airway Equipment and Method: Stylet Placement Confirmation: ETT inserted through vocal cords under direct vision, positive ETCO2 and breath sounds checked- equal and bilateral Secured at: 20 cm Tube secured with: Tape Dental Injury: Teeth and Oropharynx as per pre-operative assessment

## 2021-08-16 NOTE — Anesthesia Preprocedure Evaluation (Signed)
Anesthesia Evaluation  Patient identified by MRN, date of birth, ID band Patient awake    Reviewed: Allergy & Precautions, H&P , NPO status , Patient's Chart, lab work & pertinent test results, reviewed documented beta blocker date and time   History of Anesthesia Complications Negative for: history of anesthetic complications  Airway Mallampati: I  TM Distance: >3 FB Neck ROM: full    Dental  (+) Dental Advidsory Given, Caps, Chipped, Missing, Poor Dentition   Pulmonary neg shortness of breath, neg sleep apnea, neg COPD, neg recent URI, Current Smoker,    Pulmonary exam normal breath sounds clear to auscultation       Cardiovascular Exercise Tolerance: Good negative cardio ROS Normal cardiovascular exam Rhythm:regular Rate:Normal     Neuro/Psych negative neurological ROS  negative psych ROS   GI/Hepatic negative GI ROS, (+)     substance abuse  cocaine use and IV drug use,   Endo/Other  negative endocrine ROS  Renal/GU negative Renal ROS  negative genitourinary   Musculoskeletal  (+) narcotic dependent  Abdominal   Peds  Hematology negative hematology ROS (+)   Anesthesia Other Findings Past Medical History: No date: Drug abuse (HCC) No date: Osteomyelitis (HCC)   Reproductive/Obstetrics negative OB ROS                             Anesthesia Physical Anesthesia Plan  ASA: 2  Anesthesia Plan: General   Post-op Pain Management:    Induction: Intravenous  PONV Risk Score and Plan: 2 and Ondansetron, Dexamethasone, Midazolam, Treatment may vary due to age or medical condition and Promethazine  Airway Management Planned: Oral ETT  Additional Equipment:   Intra-op Plan:   Post-operative Plan: Extubation in OR  Informed Consent: I have reviewed the patients History and Physical, chart, labs and discussed the procedure including the risks, benefits and alternatives for the  proposed anesthesia with the patient or authorized representative who has indicated his/her understanding and acceptance.     Dental Advisory Given  Plan Discussed with: Anesthesiologist, CRNA and Surgeon  Anesthesia Plan Comments:         Anesthesia Quick Evaluation

## 2021-08-16 NOTE — Op Note (Signed)
Indications: Susan Terry presented with discitis and epidural abscess. Due to worsening epidural component, surgery was recommended.  Findings: epidural abscess  Preoperative Diagnosis: Lumbar epidural abscess Postoperative Diagnosis: same   EBL: 5 ml IVF: see AR ml Drains: 1 placed Disposition: Extubated and Stable to PACU Complications: none  No foley catheter was placed.   Preoperative Note:   Risks of surgery discussed include: infection, bleeding, stroke, coma, death, paralysis, CSF leak, nerve/spinal cord injury, numbness, tingling, weakness, complex regional pain syndrome, recurrent stenosis and/or disc herniation, vascular injury, development of instability, neck/back pain, need for further surgery, persistent symptoms, development of deformity, and the risks of anesthesia. The patient understood these risks and agreed to proceed.  Operative Note:   1. L L5/S1 hemilaminectomy for epidural abscess evacuation.  The patient was then brought from the preoperative center with intravenous access established.  The patient underwent general anesthesia and endotracheal tube intubation, and was then rotated on the [place] rail top where all pressure points were appropriately padded.  The skin was then thoroughly cleansed.  Perioperative antibiotic prophylaxis was administered.  Sterile prep and drapes were then applied and a timeout was then observed.  C-arm was brought into the field under sterile conditions and under lateral visualization the L5-S1 interspace was identified and marked.  The incision was marked on the left and injected with local anesthetic. Once this was complete a 2 cm incision was opened with the use of a #10 blade knife.    The metrx tubes were sequentially advanced and confirmed in position at L5-S1. An 7mm by 50mm tube was locked in place to the bed side attachment.  The microscope was then sterilely brought into the field and muscle creep was hemostased with a  bipolar and resected with a pituitary rongeur.  A Bovie extender was then used to expose the spinous process and lamina.  Careful attention was placed to not violate the facet capsule. A 3 mm matchstick drill bit was then used to make a hemi-laminotomy trough until the ligamentum flavum was exposed.  This was extended to the base of the spinous process and to the contralateral side to remove all the central bone from each side.  Once this was complete and the underlying ligamentum flavum was visualized, it was dissected with a curette and resected with Kerrison rongeurs.    After removal of the ligamentum, epidural abscess was noted. This was cultured.  The abscess was then removed as best as possible. The site was irrigated extensively. No CSF leak was noted.  The tube system was then removed under microscopic visualization and hemostasis was obtained with a bipolar.  A drain was placed.  The fascial layer was reapproximated with the use of a 0 Vicryl suture.  Subcutaneous tissue layer was reapproximated using 2-0 Vicryl suture.  3-0 monocryl was placed in subcuticular fashion. The skin was then cleansed and Dermabond was used to close the skin opening.  Patient was then rotated back to the preoperative bed awakened from anesthesia and taken to recovery all counts are correct in this case.    Brayant Dorr K. Myer Haff MD

## 2021-08-16 NOTE — Progress Notes (Signed)
ID Pt is resting/sleeping Says she is feeling better Pain lumbar area present No fever in many days  O/e Patient Vitals for the past 24 hrs:  BP Temp Pulse Resp SpO2  08/16/21 0854 118/75 98.7 F (37.1 C) 75 20 97 %  08/16/21 0419 112/72 98.7 F (37.1 C) 74 18 100 %  08/15/21 1944 112/65 98.8 F (37.1 C) 74 20 98 %  08/15/21 1602 104/64 98.4 F (36.9 C) 67 20 98 %   Chest b/l air entry Hss1s2 Abd soft Skin wounds and scars CNS non focal  Labs    Latest Ref Rng & Units 08/14/2021    3:25 AM 08/06/2021    5:49 AM 08/05/2021   11:26 AM  CBC  WBC 4.0 - 10.5 K/uL 7.3  11.9  7.8   Hemoglobin 12.0 - 15.0 g/dL 38.4  66.5  99.3   Hematocrit 36.0 - 46.0 % 34.0  32.8  36.8   Platelets 150 - 400 K/uL 299  158  157        Latest Ref Rng & Units 08/16/2021    4:14 AM 08/13/2021    4:08 AM 08/12/2021    8:47 AM  CMP  Glucose 70 - 99 mg/dL 97   570   BUN 6 - 20 mg/dL 16   14   Creatinine 1.77 - 1.00 mg/dL 9.39  0.30  0.92   Sodium 135 - 145 mmol/L 137   137   Potassium 3.5 - 5.1 mmol/L 4.6   3.2   Chloride 98 - 111 mmol/L 104   104   CO2 22 - 32 mmol/L 27   26   Calcium 8.9 - 10.3 mg/dL 9.2   8.5     Imaging MRI Lumbar spine   Progressive L4-L5 and L5-s1 disc edema compatible with discitis/osteo 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  Impression/recommendation  MRSA bacteremia    2 d echo from 7/11 no vegetation Worsening lumbar discitis/osteo and small epidural abscess at L4-L5/L5-S1 Pt on Dual MRSA coverage-  dapto  10mg /kg and  ceftaroline surgery today On Monday we may be able to stop ceftaroline  IVDA- polysubstance use  H/o cervical and lumbar spine infection in the past-H/o L2-L3 fusion.  Discussed the management with patient and care team ID will follow her peripherally this weekend- call if needed

## 2021-08-17 MED ORDER — HYDROMORPHONE HCL 1 MG/ML IJ SOLN
1.0000 mg | Freq: Four times a day (QID) | INTRAMUSCULAR | Status: DC | PRN
  Administered 2021-08-17 – 2021-08-18 (×4): 1 mg via INTRAVENOUS
  Filled 2021-08-17 (×4): qty 1

## 2021-08-17 NOTE — Progress Notes (Signed)
    Attending Progress Note  History: Susan Terry is here for discitis/osteomyelitis/epidural abscess.  POD1: Doing better. Back pain improved after surgery  Physical Exam: Vitals:   08/17/21 0253 08/17/21 0734  BP: 133/76 126/75  Pulse: 76 79  Resp: 19 18  Temp: 97.7 F (36.5 C) 98.4 F (36.9 C)  SpO2: 98% 100%    AA Ox3 CNI  Strength:5/5 throughout BLE SILT Drain in place  Data:  Recent Labs  Lab 08/12/21 0847 08/13/21 0408 08/16/21 0414  NA 137  --  137  K 3.2*  --  4.6  CL 104  --  104  CO2 26  --  27  BUN 14  --  16  CREATININE 1.21*   < > 1.17*  GLUCOSE 160*  --  97  CALCIUM 8.5*  --  9.2   < > = values in this interval not displayed.   No results for input(s): "AST", "ALT", "ALKPHOS" in the last 168 hours.  Invalid input(s): "TBILI"   Recent Labs  Lab 08/14/21 0325  WBC 7.3  HGB 10.7*  HCT 34.0*  PLT 299   No results for input(s): "APTT", "INR" in the last 168 hours.       Other tests/results: micro - NGTD on culture  Assessment/Plan:  Susan Terry is doing well after surgery.  - mobilize - pain control - DVT prophylaxis - PTOT - Continue drain   Venetia Night MD, [facility] Department of Neurosurgery

## 2021-08-17 NOTE — Progress Notes (Signed)
PROGRESS NOTE    Susan Terry  XLK:[id number] DOB: 10/21/1975 DOA: 08/05/2021 PCP: Oneita Hurt, No    Brief Narrative:  Susan Terry is a 46 y.o. female with medical history significant for cervical osteomyelitis, IV heroin use disorder, who presented to the hospital with acute onset of low back pain that started about a day prior to admission.  She has been using Percocet and IV heroin at home.  In route to the hospital, she became hypoxic and apneic and treatment and she was treated with Narcan.  7/20 refused lovenox. C/o back pain, states agreeable to washout to be done by neurosurgery. 7/21 pt was vaping in the room last night. Plan for surgery today 7/22 has discomfort where drain is. But overall feels better  Consultants:  ID , neurosurgery   Procedures:   Antimicrobials:  Vancomycin -d/cd Daptomycin and cetaroline   Subject: No n/v.    Objective: Vitals:   08/16/21 2037 08/16/21 2130 08/17/21 0253 08/17/21 0734  BP: 114/72 92/79 133/76 126/75  Pulse: 89 81 76 79  Resp: 16 16 19 18   Temp: (!) 97.5 F (36.4 C) 98.1 F (36.7 C) 97.7 F (36.5 C) 98.4 F (36.9 C)  TempSrc: Oral  Oral Oral  SpO2: 98% 97% 98% 100%  Weight:      Height:        Intake/Output Summary (Last 24 hours) at 08/17/2021 0812 Last data filed at 08/17/2021 0044 Gross per 24 hour  Intake 1642.2 ml  Output 5 ml  Net 1637.2 ml   Filed Weights   08/05/21 2255 08/10/21 0400 08/16/21 1554  Weight: 73 kg 71.8 kg 72.6 kg    Examination: Calm, NAD Cta no w/r Reg s1/s2 no gallop Soft benign +bs No edema Aaoxox3  Mood and affect appropriate in current setting     Data Reviewed: I have personally reviewed following labs and imaging studies  CBC: Recent Labs  Lab 08/14/21 0325  WBC 7.3  NEUTROABS 4.1  HGB 10.7*  HCT 34.0*  MCV 79.6*  PLT 299   Basic Metabolic Panel: Recent Labs  Lab 08/11/21 0611 08/12/21 0847 08/13/21 0408 08/16/21 0414  NA  --  137  --  137  K   --  3.2*  --  4.6  CL  --  104  --  104  CO2  --  26  --  27  GLUCOSE  --  160*  --  97  BUN  --  14  --  16  CREATININE 0.95 1.21* 1.08* 1.17*  CALCIUM  --  8.5*  --  9.2   GFR: Estimated Creatinine Clearance: 60.3 mL/min (A) (by C-G formula based on SCr of 1.17 mg/dL (H)). Liver Function Tests: No results for input(s): "AST", "ALT", "ALKPHOS", "BILITOT", "PROT", "ALBUMIN" in the last 168 hours. No results for input(s): "LIPASE", "AMYLASE" in the last 168 hours. No results for input(s): "AMMONIA" in the last 168 hours. Coagulation Profile: No results for input(s): "INR", "PROTIME" in the last 168 hours. Cardiac Enzymes: Recent Labs  Lab 08/12/21 0847  CKTOTAL 8*   BNP (last 3 results) No results for input(s): "PROBNP" in the last 8760 hours. HbA1C: No results for input(s): "HGBA1C" in the last 72 hours. CBG: No results for input(s): "GLUCAP" in the last 168 hours. Lipid Profile: No results for input(s): "CHOL", "HDL", "LDLCALC", "TRIG", "CHOLHDL", "LDLDIRECT" in the last 72 hours. Thyroid Function Tests: No results for input(s): "TSH", "T4TOTAL", "FREET4", "T3FREE", "THYROIDAB" in the last 72 hours. Anemia  Panel: No results for input(s): "VITAMINB12", "FOLATE", "FERRITIN", "TIBC", "IRON", "RETICCTPCT" in the last 72 hours. Sepsis Labs: No results for input(s): "PROCALCITON", "LATICACIDVEN" in the last 168 hours.  Recent Results (from the past 240 hour(s))  Culture, blood (Routine X 2) w Reflex to ID Panel     Status: None   Collection Time: 08/09/21  4:03 AM   Specimen: BLOOD  Result Value Ref Range Status   Specimen Description BLOOD LEFT AC  Final   Special Requests   Final    BOTTLES DRAWN AEROBIC ONLY Blood Culture results may not be optimal due to an excessive volume of blood received in culture bottles   Culture   Final    NO GROWTH 5 DAYS Performed at [facility], [address], [place], [place] [address]    Report Status 08/14/2021 FINAL  Final   Culture, blood (Routine X 2) w Reflex to ID Panel     Status: None   Collection Time: 08/09/21  4:03 AM   Specimen: BLOOD  Result Value Ref Range Status   Specimen Description BLOOD LEFT WRIST  Final   Special Requests   Final    IN PEDIATRIC BOTTLE Blood Culture results may not be optimal due to an excessive volume of blood received in culture bottles   Culture   Final    NO GROWTH 5 DAYS Performed at [facility], [address], [place], [place] [address]    Report Status 08/14/2021 FINAL  Final  Aerobic/Anaerobic Culture w Gram Stain (surgical/deep wound)     Status: None (Preliminary result)   Collection Time: 08/16/21  6:28 PM   Specimen: PATH Other; Body Fluid  Result Value Ref Range Status   Specimen Description   Final    WOUND Performed at [facility], [address] [place], [place] [address]    Special Requests EPIDURAL ABSCESS  Final   Gram Stain   Final    NO WBC SEEN NO ORGANISMS SEEN Performed at [facility], [address], [place], [place] [address]    Culture PENDING  Incomplete   Report Status PENDING  Incomplete         Radiology Studies: DG Lumbar Spine 2-3 Views  Result Date: 08/16/2021 CLINICAL DATA:  L5-S1 laminectomy EXAM: LUMBAR SPINE - 2-3 VIEW COMPARISON:  06/29/2017 FINDINGS: Four fluoroscopic images are obtained during the performance of the procedure and are provided for interpretation only. Images demonstrate localization of the L5-S1 disc space. Fluoroscopy time: 3 seconds 1.49 mGy IMPRESSION: Intraoperative localization of the L5-S1 disc space. Electronically Signed   By: Wiliam Ke M.D.   On: 08/16/2021 18:55   DG C-Arm 1-60 Min-No Report  Result Date: 08/16/2021 Fluoroscopy was utilized by the requesting physician.  No radiographic interpretation.        Scheduled Meds:  enoxaparin (LOVENOX) injection  40 mg Subcutaneous Q24H   lidocaine  1 patch Transdermal Q24H   methadone  20 mg Oral Daily    pregabalin  25 mg Oral BID   thiamine  100 mg Oral Daily   Continuous Infusions:  sodium chloride 10 mL/hr at 08/17/21 0505   ceFTAROline (TEFLARO) IV 600 mg (08/17/21 0505)   DAPTOmycin (CUBICIN) 700 mg in sodium chloride 0.9 % IVPB Stopped (08/15/21 2112)    Assessment & Plan:   Principal Problem:   Altered mental status Active Problems:   Acute respiratory failure with hypoxia (HCC)   Back pain   Hypokalemia   Bacteremia  due to methicillin resistant Staphylococcus aureus   Polysubstance dependence including opioid type drug, continuous use (HCC)  MRSA bacteremia in a patient with IV drug use and history of cervical C5-C6 osteomyelitis H/o cervical and lumbar spine infection in the past-H/o L2-L3 fusion. -No evidence of vegetation on 2D echo.   --TEE will not change the course of rx per ID. Pt will need IV abxs 6 weeks 7/19 ID following IV vancomycin was discontinued started on daptomycin and Ceftaroline Repeat MRI shows new collection of fluid - Neurosurgery consulted per Dr. Lovenia Shuck chat it does not look like implants are involved 7/22 s/p washout by neurosurgery Dr. Marcell Barlow on 7/21 Starting to feel better. Continue iv abx       Acute hypoxic respiratory failure in the setting for drug abuse and respiratory depression resolved  Acute toxic metabolic encephalopathy (due to above):  resolved   polysubstance drug abuse -- patient reports she is supposed to be admitted inpatient drug rehab program in [facility] July 19 -d/w pt will start on po Methadone (for Heroin withdrawal).  7/21 patient found vaping in the room.  Should not be doing this  7/22 tolerating methadone      DVT prophylaxis: Lovenox  Code Status: Full Family Communication: None at bedside Disposition Plan: Back home Status is: Inpatient Remains inpatient appropriate because: IV treatment, needs prolonged IV antibiotics for MRSA cervical discitis.  Patient with history of drug  abuse        LOS: 11 days   Time spent: 35 min    Lynn Ito, MD [facility] Pager [phone]  If 7PM-7AM, please contact night-coverage 08/17/2021, 8:12 AM

## 2021-08-18 MED ORDER — HYDROMORPHONE HCL 1 MG/ML IJ SOLN
0.5000 mg | Freq: Four times a day (QID) | INTRAMUSCULAR | Status: DC | PRN
  Administered 2021-08-18 – 2021-08-19 (×5): 0.5 mg via INTRAVENOUS
  Filled 2021-08-18 (×5): qty 0.5

## 2021-08-18 NOTE — Progress Notes (Signed)
    Attending Progress Note  History: Susan Terry is here for discitis/osteomyelitis/epidural abscess.  POD2: Having some back pain this AM. POD1: Doing better. Back pain improved after surgery  Physical Exam: Vitals:   08/18/21 0404 08/18/21 0740  BP: 110/68 101/74  Pulse: 71 71  Resp: 18 18  Temp: 98.8 F (37.1 C) 99.4 F (37.4 C)  SpO2: 99% 98%    AA Ox3 CNI  Strength:5/5 throughout BLE SILT Drain in place  Data:  Recent Labs  Lab 08/12/21 0847 08/13/21 0408 08/16/21 0414  NA 137  --  137  K 3.2*  --  4.6  CL 104  --  104  CO2 26  --  27  BUN 14  --  16  CREATININE 1.21*   < > 1.17*  GLUCOSE 160*  --  97  CALCIUM 8.5*  --  9.2   < > = values in this interval not displayed.   No results for input(s): "AST", "ALT", "ALKPHOS" in the last 168 hours.  Invalid input(s): "TBILI"   Recent Labs  Lab 08/14/21 0325  WBC 7.3  HGB 10.7*  HCT 34.0*  PLT 299   No results for input(s): "APTT", "INR" in the last 168 hours.       Other tests/results: micro - NGTD on culture  Assessment/Plan:  Susan Terry is doing well after surgery.  - mobilize - pain control - DVT prophylaxis - PTOT - Continue drain   Venetia Night MD, [facility] Department of Neurosurgery

## 2021-08-18 NOTE — Progress Notes (Signed)
PROGRESS NOTE    Susan Terry  BWG:[id number] DOB: Dec 18, 1975 DOA: 08/05/2021 PCP: Oneita Hurt, No    Brief Narrative:  Susan Terry is a 46 y.o. female with medical history significant for cervical osteomyelitis, IV heroin use disorder, who presented to the hospital with acute onset of low back pain that started about a day prior to admission.  She has been using Percocet and IV heroin at home.  In route to the hospital, she became hypoxic and apneic and treatment and she was treated with Narcan.  7/20 refused lovenox. C/o back pain, states agreeable to washout to be done by neurosurgery. 7/21 pt was vaping in the room last night. Plan for surgery today 7/22 has discomfort where drain is. But overall feels better 7/23 discussed with patient about weaning off of IV pain meds i.e. Dilaudid.  Although she is asleep and not in any acute distress she says that she is in pain and not to do it.  I did tell her we will still have to continue weaning her off.  She reports being in pain today.  Consultants:  ID , neurosurgery   Procedures:   Antimicrobials:  Vancomycin -d/cd Daptomycin and cetaroline   Subject: No nausea, vomiting, or any other symptoms.  Has declined Lovenox    Objective: Vitals:   08/17/21 1557 08/17/21 2015 08/18/21 0404 08/18/21 0740  BP: (!) 104/53 118/67 110/68 101/74  Pulse: 77 73 71 71  Resp: 18 18 18 18   Temp: 98.4 F (36.9 C) 98.2 F (36.8 C) 98.8 F (37.1 C) 99.4 F (37.4 C)  TempSrc:  Oral Oral Oral  SpO2: 98% 99% 99% 98%  Weight:      Height:        Intake/Output Summary (Last 24 hours) at 08/18/2021 0818 Last data filed at 08/17/2021 2007 Gross per 24 hour  Intake --  Output 0 ml  Net 0 ml   Filed Weights   08/05/21 2255 08/10/21 0400 08/16/21 1554  Weight: 73 kg 71.8 kg 72.6 kg    Examination: Calm, NAD, response although eyes are closed Cta no w/r Reg s1/s2 no gallop Soft benign +bs No edema Grossly intact Mood and affect  appropriate in current setting     Data Reviewed: I have personally reviewed following labs and imaging studies  CBC: Recent Labs  Lab 08/14/21 0325  WBC 7.3  NEUTROABS 4.1  HGB 10.7*  HCT 34.0*  MCV 79.6*  PLT 299   Basic Metabolic Panel: Recent Labs  Lab 08/12/21 0847 08/13/21 0408 08/16/21 0414  NA 137  --  137  K 3.2*  --  4.6  CL 104  --  104  CO2 26  --  27  GLUCOSE 160*  --  97  BUN 14  --  16  CREATININE 1.21* 1.08* 1.17*  CALCIUM 8.5*  --  9.2   GFR: Estimated Creatinine Clearance: 60.3 mL/min (A) (by C-G formula based on SCr of 1.17 mg/dL (H)). Liver Function Tests: No results for input(s): "AST", "ALT", "ALKPHOS", "BILITOT", "PROT", "ALBUMIN" in the last 168 hours. No results for input(s): "LIPASE", "AMYLASE" in the last 168 hours. No results for input(s): "AMMONIA" in the last 168 hours. Coagulation Profile: No results for input(s): "INR", "PROTIME" in the last 168 hours. Cardiac Enzymes: Recent Labs  Lab 08/12/21 0847  CKTOTAL 8*   BNP (last 3 results) No results for input(s): "PROBNP" in the last 8760 hours. HbA1C: No results for input(s): "HGBA1C" in the last 72 hours.  CBG: No results for input(s): "GLUCAP" in the last 168 hours. Lipid Profile: No results for input(s): "CHOL", "HDL", "LDLCALC", "TRIG", "CHOLHDL", "LDLDIRECT" in the last 72 hours. Thyroid Function Tests: No results for input(s): "TSH", "T4TOTAL", "FREET4", "T3FREE", "THYROIDAB" in the last 72 hours. Anemia Panel: No results for input(s): "VITAMINB12", "FOLATE", "FERRITIN", "TIBC", "IRON", "RETICCTPCT" in the last 72 hours. Sepsis Labs: No results for input(s): "PROCALCITON", "LATICACIDVEN" in the last 168 hours.  Recent Results (from the past 240 hour(s))  Culture, blood (Routine X 2) w Reflex to ID Panel     Status: None   Collection Time: 08/09/21  4:03 AM   Specimen: BLOOD  Result Value Ref Range Status   Specimen Description BLOOD LEFT AC  Final   Special Requests    Final    BOTTLES DRAWN AEROBIC ONLY Blood Culture results may not be optimal due to an excessive volume of blood received in culture bottles   Culture   Final    NO GROWTH 5 DAYS Performed at [facility], [address], [place], [place] [address]    Report Status 08/14/2021 FINAL  Final  Culture, blood (Routine X 2) w Reflex to ID Panel     Status: None   Collection Time: 08/09/21  4:03 AM   Specimen: BLOOD  Result Value Ref Range Status   Specimen Description BLOOD LEFT WRIST  Final   Special Requests   Final    IN PEDIATRIC BOTTLE Blood Culture results may not be optimal due to an excessive volume of blood received in culture bottles   Culture   Final    NO GROWTH 5 DAYS Performed at [facility], [address], [place], [place] [address]    Report Status 08/14/2021 FINAL  Final  Aerobic/Anaerobic Culture w Gram Stain (surgical/deep wound)     Status: None (Preliminary result)   Collection Time: 08/16/21  6:28 PM   Specimen: PATH Other; Body Fluid  Result Value Ref Range Status   Specimen Description   Final    WOUND Performed at [facility], [address] [place], [place] [address]    Special Requests EPIDURAL ABSCESS  Final   Gram Stain NO WBC SEEN NO ORGANISMS SEEN   Final   Culture   Final    NO GROWTH < 12 HOURS Performed at [facility], [address] [place], [place] [address]    Report Status PENDING  Incomplete         Radiology Studies: DG Lumbar Spine 2-3 Views  Result Date: 08/16/2021 CLINICAL DATA:  L5-S1 laminectomy EXAM: LUMBAR SPINE - 2-3 VIEW COMPARISON:  06/29/2017 FINDINGS: Four fluoroscopic images are obtained during the performance of the procedure and are provided for interpretation only. Images demonstrate localization of the L5-S1 disc space. Fluoroscopy time: 3 seconds 1.49 mGy IMPRESSION: Intraoperative localization of the L5-S1 disc space. Electronically Signed   By: Wiliam Ke M.D.   On:  08/16/2021 18:55   DG C-Arm 1-60 Min-No Report  Result Date: 08/16/2021 Fluoroscopy was utilized by the requesting physician.  No radiographic interpretation.        Scheduled Meds:  enoxaparin (LOVENOX) injection  40 mg Subcutaneous Q24H   lidocaine  1 patch Transdermal Q24H   methadone  20 mg Oral Daily   pregabalin  25 mg Oral BID   thiamine  100 mg Oral Daily   Continuous Infusions:  sodium chloride 10 mL/hr at 08/17/21 0505   ceFTAROline (TEFLARO) IV 600 mg (08/18/21  1638)   DAPTOmycin (CUBICIN) 700 mg in sodium chloride 0.9 % IVPB 700 mg (08/17/21 2007)    Assessment & Plan:   Principal Problem:   Altered mental status Active Problems:   Acute respiratory failure with hypoxia (HCC)   Back pain   Hypokalemia   Bacteremia due to methicillin resistant Staphylococcus aureus   Polysubstance dependence including opioid type drug, continuous use (HCC)  MRSA bacteremia in a patient with IV drug use and history of cervical C5-C6 osteomyelitis H/o cervical and lumbar spine infection in the past-H/o L2-L3 fusion. -No evidence of vegetation on 2D echo.   --TEE will not change the course of rx per ID. Pt will need IV abxs 6 weeks 7/19 ID following IV vancomycin was discontinued started on daptomycin and Ceftaroline Repeat MRI shows new collection of fluid - Neurosurgery consulted per Dr. Lovenia Shuck chat it does not look like implants are involved 7/22 s/p washout by neurosurgery Dr. Marcell Barlow on 7/21 Starting to feel better. 7/23 mobilize Wean down IV Dilaudid 2.5 every 6 hours as needed Continue drain       Acute hypoxic respiratory failure in the setting for drug abuse and respiratory depression Resolved  Acute toxic metabolic encephalopathy (due to above):  Resolved   polysubstance drug abuse -- patient reports she is supposed to be admitted inpatient drug rehab program in [facility] July 19 -d/w pt will start on po Methadone (for Heroin withdrawal).   7/21 patient found vaping in the room.  Should not be doing this  7/23 tolerating methadone   Motrin      DVT prophylaxis: Lovenox  Code Status: Full Family Communication: None at bedside Disposition Plan: Back home Status is: Inpatient Remains inpatient appropriate because: IV treatment, needs prolonged IV antibiotics for MRSA cervical discitis.  Patient with history of drug abuse        LOS: 12 days   Time spent: 35 min    Lynn Ito, MD [facility] Pager [phone]  If 7PM-7AM, please contact night-coverage 08/18/2021, 8:18 AM

## 2021-08-18 NOTE — Plan of Care (Signed)
Patient remains alert and oriented. Continues to have some pain to back with PRN pain meds given. Drain remains in place. Patient ambulating in room independently and to bathroom.  Problem: Education: Goal: Knowledge of General Education information will improve Description: Including pain rating scale, medication(s)/side effects and non-pharmacologic comfort measures Outcome: Progressing   Problem: Health Behavior/Discharge Planning: Goal: Ability to manage health-related needs will improve Outcome: Progressing   Problem: Clinical Measurements: Goal: Ability to maintain clinical measurements within normal limits will improve Outcome: Progressing Goal: Will remain free from infection Outcome: Progressing Goal: Diagnostic test results will improve Outcome: Progressing Goal: Respiratory complications will improve Outcome: Progressing Goal: Cardiovascular complication will be avoided Outcome: Progressing   Problem: Activity: Goal: Risk for activity intolerance will decrease Outcome: Progressing   Problem: Nutrition: Goal: Adequate nutrition will be maintained Outcome: Progressing   Problem: Elimination: Goal: Will not experience complications related to bowel motility Outcome: Progressing Goal: Will not experience complications related to urinary retention Outcome: Progressing   Problem: Coping: Goal: Level of anxiety will decrease Outcome: Progressing   Problem: Pain Managment: Goal: General experience of comfort will improve Outcome: Progressing   Problem: Skin Integrity: Goal: Risk for impaired skin integrity will decrease Outcome: Progressing   Problem: Safety: Goal: Ability to remain free from injury will improve Outcome: Progressing

## 2021-08-19 ENCOUNTER — Encounter: Payer: Self-pay | Admitting: Neurosurgery

## 2021-08-19 LAB — CBC
HCT: 32.7 % — ABNORMAL LOW (ref 36.0–46.0)
Hemoglobin: 10.1 g/dL — ABNORMAL LOW (ref 12.0–15.0)
MCH: 25.4 pg — ABNORMAL LOW (ref 26.0–34.0)
MCHC: 30.9 g/dL (ref 30.0–36.0)
MCV: 82.2 fL (ref 80.0–100.0)
Platelets: 309 K/uL (ref 150–400)
RBC: 3.98 MIL/uL (ref 3.87–5.11)
RDW: 14.1 % (ref 11.5–15.5)
WBC: 6.1 K/uL (ref 4.0–10.5)
nRBC: 0 % (ref 0.0–0.2)

## 2021-08-19 LAB — BASIC METABOLIC PANEL WITH GFR
Anion gap: 8 (ref 5–15)
BUN: 17 mg/dL (ref 6–20)
CO2: 27 mmol/L (ref 22–32)
Calcium: 9 mg/dL (ref 8.9–10.3)
Chloride: 104 mmol/L (ref 98–111)
Creatinine, Ser: 1.22 mg/dL — ABNORMAL HIGH (ref 0.44–1.00)
GFR, Estimated: 56 mL/min — ABNORMAL LOW
Glucose, Bld: 93 mg/dL (ref 70–99)
Potassium: 4.3 mmol/L (ref 3.5–5.1)
Sodium: 139 mmol/L (ref 135–145)

## 2021-08-19 LAB — CK: Total CK: 9 U/L — ABNORMAL LOW (ref 38–234)

## 2021-08-19 MED ORDER — SODIUM CHLORIDE 0.9 % IV SOLN
700.0000 mg | Freq: Every day | INTRAVENOUS | Status: DC
  Administered 2021-08-19 – 2021-08-26 (×8): 700 mg via INTRAVENOUS
  Filled 2021-08-19 (×9): qty 14

## 2021-08-19 MED ORDER — HYDROMORPHONE HCL 1 MG/ML IJ SOLN
0.5000 mg | Freq: Two times a day (BID) | INTRAMUSCULAR | Status: DC | PRN
  Administered 2021-08-19 – 2021-08-20 (×2): 0.5 mg via INTRAVENOUS
  Filled 2021-08-19 (×2): qty 0.5

## 2021-08-19 NOTE — Progress Notes (Signed)
    Attending Progress Note  History: Susan Terry is here for discitis/osteomyelitis/epidural abscess.  POD3: Pt resting comfortably this morning without complaints. POD2: Having some back pain this AM. POD1: Doing better. Back pain improved after surgery  Physical Exam: Vitals:   08/18/21 1921 08/19/21 0548  BP: 108/66 97/71  Pulse: 77 (!) 58  Resp: 18 16  Temp: 98.4 F (36.9 C) 98.1 F (36.7 C)  SpO2: 98% 99%    Drowsy but arouses easily to voice.  MAEW Drain with 0 output yesterday  Data:  Recent Labs  Lab 08/12/21 0847 08/13/21 0408 08/16/21 0414 08/19/21 0644  NA 137  --  137 139  K 3.2*  --  4.6 4.3  CL 104  --  104 104  CO2 26  --  27 27  BUN 14  --  16 17  CREATININE 1.21*   < > 1.17* 1.22*  GLUCOSE 160*  --  97 93  CALCIUM 8.5*  --  9.2 9.0   < > = values in this interval not displayed.    No results for input(s): "AST", "ALT", "ALKPHOS" in the last 168 hours.  Invalid input(s): "TBILI"   Recent Labs  Lab 08/14/21 0325 08/19/21 0644  WBC 7.3 6.1  HGB 10.7* 10.1*  HCT 34.0* 32.7*  PLT 299 309    No results for input(s): "APTT", "INR" in the last 168 hours.       Other tests/results: micro - NGTD on culture  Assessment/Plan:  Susan Terry is doing well after surgery.  - mobilize - pain control - DVT prophylaxis - PTOT - drain removed this morning  - will follow up outpatient in 2 weeks.  Manning Charity PA-C  Department of Neurosurgery

## 2021-08-19 NOTE — Progress Notes (Signed)
PROGRESS NOTE    Rhena Glace  NWG:[id number] DOB: 07-11-1975 DOA: 08/05/2021 PCP: Oneita Hurt, No    Brief Narrative:  Susan Terry is a 46 y.o. female with medical history significant for cervical osteomyelitis, IV heroin use disorder, who presented to the hospital with acute onset of low back pain that started about a day prior to admission.  She has been using Percocet and IV heroin at home.  In route to the hospital, she became hypoxic and apneic and treatment and she was treated with Narcan.  7/20 refused lovenox. C/o back pain, states agreeable to washout to be done by neurosurgery. 7/21 pt was vaping in the room last night. Plan for surgery today 7/22 has discomfort where drain is. But overall feels better 7/23 discussed with patient about weaning off of IV pain meds i.e. Dilaudid.  Although she is asleep and not in any acute distress she says that she is in pain and not to do it.  I did tell her we will still have to continue weaning her off.  She reports being in pain today. 7/24 no overnight issues.  Denies having pain  Consultants:  ID , neurosurgery   Procedures:   Antimicrobials:  Vancomycin -d/cd Daptomycin and cetaroline   Subject: No new complaints   Objective: Vitals:   08/18/21 0740 08/18/21 1921 08/19/21 0548 08/19/21 0830  BP: 101/74 108/66 97/71 114/66  Pulse: 71 77 (!) 58 62  Resp: 18 18 16 18   Temp: 99.4 F (37.4 C) 98.4 F (36.9 C) 98.1 F (36.7 C) 98.4 F (36.9 C)  TempSrc: Oral Oral Oral   SpO2: 98% 98% 99% 98%  Weight:      Height:        Intake/Output Summary (Last 24 hours) at 08/19/2021 1313 Last data filed at 08/19/2021 0421 Gross per 24 hour  Intake 748.62 ml  Output --  Net 748.62 ml   Filed Weights   08/05/21 2255 08/10/21 0400 08/16/21 1554  Weight: 73 kg 71.8 kg 72.6 kg    Examination: Calm, NAD Cta no w/r Reg s1/s2 no gallop Soft benign +bs No edema Grossly intact Mood and affect appropriate in current  setting     Data Reviewed: I have personally reviewed following labs and imaging studies  CBC: Recent Labs  Lab 08/14/21 0325 08/19/21 0644  WBC 7.3 6.1  NEUTROABS 4.1  --   HGB 10.7* 10.1*  HCT 34.0* 32.7*  MCV 79.6* 82.2  PLT 299 309   Basic Metabolic Panel: Recent Labs  Lab 08/13/21 0408 08/16/21 0414 08/19/21 0644  NA  --  137 139  K  --  4.6 4.3  CL  --  104 104  CO2  --  27 27  GLUCOSE  --  97 93  BUN  --  16 17  CREATININE 1.08* 1.17* 1.22*  CALCIUM  --  9.2 9.0   GFR: Estimated Creatinine Clearance: 57.8 mL/min (A) (by C-G formula based on SCr of 1.22 mg/dL (H)). Liver Function Tests: No results for input(s): "AST", "ALT", "ALKPHOS", "BILITOT", "PROT", "ALBUMIN" in the last 168 hours. No results for input(s): "LIPASE", "AMYLASE" in the last 168 hours. No results for input(s): "AMMONIA" in the last 168 hours. Coagulation Profile: No results for input(s): "INR", "PROTIME" in the last 168 hours. Cardiac Enzymes: Recent Labs  Lab 08/19/21 0644  CKTOTAL 9*   BNP (last 3 results) No results for input(s): "PROBNP" in the last 8760 hours. HbA1C: No results for input(s): "HGBA1C" in  the last 72 hours. CBG: No results for input(s): "GLUCAP" in the last 168 hours. Lipid Profile: No results for input(s): "CHOL", "HDL", "LDLCALC", "TRIG", "CHOLHDL", "LDLDIRECT" in the last 72 hours. Thyroid Function Tests: No results for input(s): "TSH", "T4TOTAL", "FREET4", "T3FREE", "THYROIDAB" in the last 72 hours. Anemia Panel: No results for input(s): "VITAMINB12", "FOLATE", "FERRITIN", "TIBC", "IRON", "RETICCTPCT" in the last 72 hours. Sepsis Labs: No results for input(s): "PROCALCITON", "LATICACIDVEN" in the last 168 hours.  Recent Results (from the past 240 hour(s))  Aerobic/Anaerobic Culture w Gram Stain (surgical/deep wound)     Status: None (Preliminary result)   Collection Time: 08/16/21  6:28 PM   Specimen: PATH Other; Body Fluid  Result Value Ref Range Status    Specimen Description   Final    WOUND Performed at [facility], [address] [place], [place] [address]    Special Requests EPIDURAL ABSCESS  Final   Gram Stain NO WBC SEEN NO ORGANISMS SEEN   Final   Culture   Final    NO GROWTH 3 DAYS NO ANAEROBES ISOLATED; CULTURE IN PROGRESS FOR 5 DAYS Performed at [facility], [address] [place], [place] [address]    Report Status PENDING  Incomplete         Radiology Studies: No results found.      Scheduled Meds:  enoxaparin (LOVENOX) injection  40 mg Subcutaneous Q24H   lidocaine  1 patch Transdermal Q24H   methadone  20 mg Oral Daily   pregabalin  25 mg Oral BID   thiamine  100 mg Oral Daily   Continuous Infusions:  sodium chloride 10 mL/hr at 08/19/21 0546   ceFTAROline (TEFLARO) IV 600 mg (08/19/21 0547)   DAPTOmycin (CUBICIN) 700 mg in sodium chloride 0.9 % IVPB Stopped (08/18/21 2101)    Assessment & Plan:   Principal Problem:   Altered mental status Active Problems:   Acute respiratory failure with hypoxia (HCC)   Back pain   Hypokalemia   Bacteremia due to methicillin resistant Staphylococcus aureus   Polysubstance dependence including opioid type drug, continuous use (HCC)  MRSA bacteremia in a patient with IV drug use and history of cervical C5-C6 osteomyelitis H/o cervical and lumbar spine infection in the past-H/o L2-L3 fusion. -No evidence of vegetation on 2D echo.   --TEE will not change the course of rx per ID. Pt will need IV abxs 6 weeks 7/19 ID following IV vancomycin was discontinued started on daptomycin and Ceftaroline Repeat MRI shows new collection of fluid - Neurosurgery consulted per Dr. Lovenia Shuck chat it does not look like implants are involved 7/22 s/p washout by neurosurgery Dr. Marcell Barlow on 7/21 Starting to feel better. 7/23 mobilize 7/24 weaning off of Dilaudid, will change to 0.5mg  bid prn Drain removed        Acute hypoxic respiratory failure  in the setting for drug abuse and respiratory depression Resolved  Acute toxic metabolic encephalopathy (due to above):  Resolved   polysubstance drug abuse -- patient reports she is supposed to be admitted inpatient drug rehab program in [facility] July 19 -d/w pt will start on po Methadone (for Heroin withdrawal).  7/21 patient found vaping in the room.  Should not be doing this  7/24 tolerating methadone         DVT prophylaxis: Lovenox  Code Status: Full Family Communication: None at bedside Disposition Plan: Back home Status is: Inpatient Remains inpatient appropriate because: IV treatment, needs prolonged IV antibiotics for MRSA cervical  discitis.  Patient with history of drug abuse        LOS: 13 days   Time spent: 35 min    Lynn Ito, MD [facility] Pager [phone]  If 7PM-7AM, please contact night-coverage 08/19/2021, 1:13 PM

## 2021-08-19 NOTE — Anesthesia Postprocedure Evaluation (Signed)
Anesthesia Post Note  Patient: Susan Terry [facility]  Procedure(s) Performed: LUMBAR LAMINECTOMY FOR EPIDURAL ABSCESS (Left: Back)  Patient location during evaluation: PACU Anesthesia Type: General Level of consciousness: awake and alert Pain management: pain level controlled Vital Signs Assessment: post-procedure vital signs reviewed and stable Respiratory status: spontaneous breathing, nonlabored ventilation, respiratory function stable and patient connected to nasal cannula oxygen Cardiovascular status: blood pressure returned to baseline and stable Postop Assessment: no apparent nausea or vomiting Anesthetic complications: no   No notable events documented.   Last Vitals:  Vitals:   08/19/21 0830 08/19/21 1656  BP: 114/66 (!) 89/53  Pulse: 62 60  Resp: 18 17  Temp: 36.9 C 36.9 C  SpO2: 98% 96%    Last Pain:  Vitals:   08/19/21 1341  TempSrc:   PainSc: Asleep                 Lenard Simmer

## 2021-08-19 NOTE — Progress Notes (Signed)
ID Pt underwent lumbar surgery on Friday Doing better Pain better  O/e Patient Vitals for the past 24 hrs:  BP Temp Temp src Pulse Resp SpO2  08/19/21 0830 114/66 98.4 F (36.9 C) -- 62 18 98 %  08/19/21 0548 97/71 98.1 F (36.7 C) Oral (!) 58 16 99 %  08/18/21 1921 108/66 98.4 F (36.9 C) Oral 77 18 98 %   Chest b/l air entry [id number] Abd soft Skin wounds and scars Lumbar surgical site covered with honeycomb Drain has been removed  Labs    Latest Ref Rng & Units 08/19/2021    6:44 AM 08/14/2021    3:25 AM 08/06/2021    5:49 AM  CBC  WBC 4.0 - 10.5 K/uL 6.1  7.3  11.9   Hemoglobin 12.0 - 15.0 g/dL 25.8  52.7  78.2   Hematocrit 36.0 - 46.0 % 32.7  34.0  32.8   Platelets 150 - 400 K/uL 309  299  158        Latest Ref Rng & Units 08/19/2021    6:44 AM 08/16/2021    4:14 AM 08/13/2021    4:08 AM  CMP  Glucose 70 - 99 mg/dL 93  97    BUN 6 - 20 mg/dL 17  16    Creatinine 4.23 - 1.00 mg/dL 5.36  1.44  3.15   Sodium 135 - 145 mmol/L 139  137    Potassium 3.5 - 5.1 mmol/L 4.3  4.6    Chloride 98 - 111 mmol/L 104  104    CO2 22 - 32 mmol/L 27  27    Calcium 8.9 - 10.3 mg/dL 9.0  9.2      Imaging MRI Lumbar spine   Progressive L4-L5 and L5-s1 disc edema compatible with discitis/osteo 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  Impression/recommendation  MRSA bacteremia  with lumbar L4-L5 and L5-S1 infection S/p L5-S1 hemilaminectomy On ceftaroline and dapto  2 d echo from 7/11 no vegetation  AKI- could be due to NSAID May have to consider Dcing that  IVDA- polysubstance use  H/o cervical and lumbar spine infection in the past-  H/o L2-L3 fusion.  Discussed the management with patient and care team

## 2021-08-19 NOTE — Discharge Instructions (Signed)
Your surgeon has performed an operation on your lumbar spine (low back) to relieve pressure on one or more nerves. Many times, patients feel better immediately after surgery and can "overdo it." Even if you feel well, it is important that you follow these activity guidelines. If you do not let your back heal properly from the surgery, you can increase the chance of return of your symptoms. The following are instructions to help in your recovery once you have been discharged from the hospital.   Activity    No bending, lifting, or twisting ("BLT"). Avoid lifting objects heavier than 10 pounds (gallon milk jug).  Where possible, avoid household activities that involve lifting, bending, pushing, or pulling such as laundry, vacuuming, grocery shopping, and childcare. Try to arrange for help from friends and family for these activities while your back heals.  Increase physical activity slowly as tolerated.  Taking short walks is encouraged, but avoid strenuous exercise. Do not jog, run, bicycle, lift weights, or participate in any other exercises unless specifically allowed by your doctor. Avoid prolonged sitting, including car rides.  Talk to your doctor before resuming sexual activity.  You should not drive until cleared by your doctor.  Until released by your doctor, you should not return to work or school.  You should rest at home and let your body heal.   You may shower two days after your surgery.  After showering, lightly dab your incision dry. Do not take a tub bath or go swimming for 3 weeks, or until approved by your doctor at your follow-up appointment.  If you smoke, we strongly recommend that you quit.  Smoking has been proven to interfere with normal healing in your back and will dramatically reduce the success rate of your surgery. Please contact [facility] (800-QUIT-NOW) and use the resources at www.[facility].com for assistance in stopping smoking.  Surgical Incision   If you have  a dressing on your incision, you may remove it three days after your surgery. Keep your incision area clean and dry.  If you have staples or stitches on your incision, you should have a follow up scheduled for removal. If you do not have staples or stitches, you will have steri-strips (small pieces of surgical tape) or Dermabond glue. The steri-strips/glue should begin to peel away within about a week (it is fine if the steri-strips fall off before then). If the strips are still in place one week after your surgery, you may gently remove them.  Diet            You may return to your usual diet. Be sure to stay hydrated.  When to Contact us  Although your surgery and recovery will likely be uneventful, you may have some residual numbness, aches, and pains in your back and/or legs. This is normal and should improve in the next few weeks.  However, should you experience any of the following, contact us immediately: New numbness or weakness Pain that is progressively getting worse, and is not relieved by your pain medications or rest Bleeding, redness, swelling, pain, or drainage from surgical incision Chills or flu-like symptoms Fever greater than 101.0 F (38.3 C) Problems with bowel or bladder functions Difficulty breathing or shortness of breath Warmth, tenderness, or swelling in your calf  Contact Information During office hours (Monday-Friday 9 am to 5 pm), please call your physician at [phone] After hours and weekends, please call [phone] and speak with the answering service, who will contact the doctor on call.  If that fails, call the [facility] Operator at [phone] and ask for the Neurosurgery Resident On Call  For a life-threatening emergency, call 911

## 2021-08-20 ENCOUNTER — Other Ambulatory Visit: Payer: Self-pay | Admitting: Psychiatry

## 2021-08-20 MED ORDER — CLONAZEPAM 0.25 MG PO TBDP: 0.2500 mg | ORAL_TABLET | Freq: Two times a day (BID) | ORAL | Status: DC

## 2021-08-20 MED ORDER — HYDROXYZINE HCL 25 MG PO TABS
50.0000 mg | ORAL_TABLET | Freq: Two times a day (BID) | ORAL | Status: DC
  Administered 2021-08-20 – 2021-08-27 (×14): 50 mg via ORAL
  Filled 2021-08-20 (×14): qty 2

## 2021-08-20 NOTE — Progress Notes (Signed)
PROGRESS NOTE    Susan Terry  JQB:[id number] DOB: 17-Sep-1975 DOA: 08/05/2021 PCP: Oneita Hurt, No    Brief Narrative:  Susan Terry is a 46 y.o. female with medical history significant for cervical osteomyelitis, IV heroin use disorder, who presented to the hospital with acute onset of low back pain that started about a day prior to admission.  She has been using Percocet and IV heroin at home.  In route to the hospital, she became hypoxic and apneic and treatment and she was treated with Narcan.  7/20 refused lovenox. C/o back pain, states agreeable to washout to be done by neurosurgery. 7/21 pt was vaping in the room last night. Plan for surgery today 7/22 has discomfort where drain is. But overall feels better 7/23 discussed with patient about weaning off of IV pain meds i.e. Dilaudid.  Although she is asleep and not in any acute distress she says that she is in pain and not to do it.  I did tell her we will still have to continue weaning her off.  She reports being in pain today. 7/24 no overnight issues.  Denies having pain 7/25 told patient I am weaning her off of IV Dilaudid and she was fine with this.  Consultants:  ID , neurosurgery   Procedures:   Antimicrobials:  Vancomycin -d/cd Daptomycin and cetaroline   Subject: Not much pain. No new complaints except she wants her methadone dose to be increased since I am d/cing Dilaudid.  Told patient I will have psychiatry involved and she is agreeable.   Objective: Vitals:   08/19/21 0548 08/19/21 0830 08/19/21 1656 08/19/21 2019  BP: 97/71 114/66 (!) 89/53 110/66  Pulse: (!) 58 62 60 67  Resp: 16 18 17 16   Temp: 98.1 F (36.7 C) 98.4 F (36.9 C) 98.5 F (36.9 C) 98.8 F (37.1 C)  TempSrc: Oral     SpO2: 99% 98% 96% 98%  Weight:      Height:        Intake/Output Summary (Last 24 hours) at 08/20/2021 0814 Last data filed at 08/20/2021 0617 Gross per 24 hour  Intake 118 ml  Output --  Net 118 ml   Filed  Weights   08/05/21 2255 08/10/21 0400 08/16/21 1554  Weight: 73 kg 71.8 kg 72.6 kg    Examination: Calm, NAD Cta no w/r Reg s1/s2 no gallop Soft benign +bs No edema Aaoxox3  Mood and affect appropriate in current setting     Data Reviewed: I have personally reviewed following labs and imaging studies  CBC: Recent Labs  Lab 08/14/21 0325 08/19/21 0644  WBC 7.3 6.1  NEUTROABS 4.1  --   HGB 10.7* 10.1*  HCT 34.0* 32.7*  MCV 79.6* 82.2  PLT 299 309   Basic Metabolic Panel: Recent Labs  Lab 08/16/21 0414 08/19/21 0644  NA 137 139  K 4.6 4.3  CL 104 104  CO2 27 27  GLUCOSE 97 93  BUN 16 17  CREATININE 1.17* 1.22*  CALCIUM 9.2 9.0   GFR: Estimated Creatinine Clearance: 57.8 mL/min (A) (by C-G formula based on SCr of 1.22 mg/dL (H)). Liver Function Tests: No results for input(s): "AST", "ALT", "ALKPHOS", "BILITOT", "PROT", "ALBUMIN" in the last 168 hours. No results for input(s): "LIPASE", "AMYLASE" in the last 168 hours. No results for input(s): "AMMONIA" in the last 168 hours. Coagulation Profile: No results for input(s): "INR", "PROTIME" in the last 168 hours. Cardiac Enzymes: Recent Labs  Lab 08/19/21 0644  CKTOTAL 9*  BNP (last 3 results) No results for input(s): "PROBNP" in the last 8760 hours. HbA1C: No results for input(s): "HGBA1C" in the last 72 hours. CBG: No results for input(s): "GLUCAP" in the last 168 hours. Lipid Profile: No results for input(s): "CHOL", "HDL", "LDLCALC", "TRIG", "CHOLHDL", "LDLDIRECT" in the last 72 hours. Thyroid Function Tests: No results for input(s): "TSH", "T4TOTAL", "FREET4", "T3FREE", "THYROIDAB" in the last 72 hours. Anemia Panel: No results for input(s): "VITAMINB12", "FOLATE", "FERRITIN", "TIBC", "IRON", "RETICCTPCT" in the last 72 hours. Sepsis Labs: No results for input(s): "PROCALCITON", "LATICACIDVEN" in the last 168 hours.  Recent Results (from the past 240 hour(s))  Aerobic/Anaerobic Culture w Gram  Stain (surgical/deep wound)     Status: None (Preliminary result)   Collection Time: 08/16/21  6:28 PM   Specimen: PATH Other; Body Fluid  Result Value Ref Range Status   Specimen Description   Final    WOUND Performed at [facility], [address] [place], [place] [address]    Special Requests EPIDURAL ABSCESS  Final   Gram Stain NO WBC SEEN NO ORGANISMS SEEN   Final   Culture   Final    NO GROWTH 3 DAYS NO ANAEROBES ISOLATED; CULTURE IN PROGRESS FOR 5 DAYS Performed at [facility], [address], [place], [place] [address]    Report Status PENDING  Incomplete         Radiology Studies: No results found.      Scheduled Meds:  enoxaparin (LOVENOX) injection  40 mg Subcutaneous Q24H   lidocaine  1 patch Transdermal Q24H   methadone  20 mg Oral Daily   pregabalin  25 mg Oral BID   thiamine  100 mg Oral Daily   Continuous Infusions:  sodium chloride 10 mL/hr at 08/19/21 0546   ceFTAROline (TEFLARO) IV 600 mg (08/20/21 7564)   DAPTOmycin (CUBICIN) 700 mg in sodium chloride 0.9 % IVPB 700 mg (08/19/21 2144)    Assessment & Plan:   Principal Problem:   Altered mental status Active Problems:   Acute respiratory failure with hypoxia (HCC)   Back pain   Hypokalemia   Bacteremia due to methicillin resistant Staphylococcus aureus   Polysubstance dependence including opioid type drug, continuous use (HCC)  MRSA bacteremia in a patient with IV drug use and history of cervical C5-C6 osteomyelitis H/o cervical and lumbar spine infection in the past-H/o L2-L3 fusion. -No evidence of vegetation on 2D echo.   --TEE will not change the course of rx per ID. Pt will need IV abxs 6 weeks 7/19 ID following IV vancomycin was discontinued started on daptomycin and Ceftaroline Repeat MRI shows new collection of fluid - Neurosurgery consulted per Dr. Lovenia Shuck chat it does not look like implants are involved 7/22 s/p washout by neurosurgery Dr. Marcell Barlow  on 7/21 Starting to feel better. 7/23 mobilize 7/24 weaning off of Dilaudid, will change to 0.5mg  bid prn Drain removed 7/25 pain free.  Continue iv abx          Acute hypoxic respiratory failure in the setting for drug abuse and respiratory depression resolved  Acute toxic metabolic encephalopathy (due to above):  resolved   polysubstance drug abuse -- patient reports she is supposed to be admitted inpatient drug rehab program in [facility] July 19 -d/w pt will start on po Methadone (for Heroin withdrawal).  7/21 patient found vaping in the room.  Should not be doing this  7/24 tolerating methadone  7/25 dc iv Dilaudid.  We will consult  psych to help with methadone or other withdrawal symptoms.although no evidence of withdrawal , however patient wants her methadone dose to be increased since dilaudid decreased.         DVT prophylaxis: Lovenox  Code Status: Full Family Communication: None at bedside Disposition Plan: Back home Status is: Inpatient Remains inpatient appropriate because: IV treatment, needs prolonged IV antibiotics for MRSA cervical discitis.  Patient with history of drug abuse        LOS: 14 days   Time spent: 35 min    Lynn Ito, MD [facility] Pager [phone]  If 7PM-7AM, please contact night-coverage 08/20/2021, 8:14 AM

## 2021-08-20 NOTE — Consult Note (Signed)
[facility] Face-to-Face Psychiatry Consult   Reason for Consult: Consult for 46 year old woman with a history of opiate use disorder who is in the hospital for osteomyelitis.  Complaints about possible withdrawal symptoms. Referring Physician:  Marylu Lund Patient Identification: Susan Terry MRN:  [id number] Principal Diagnosis: Polysubstance dependence including opioid type drug, continuous use (HCC) Diagnosis:  Principal Problem:   Polysubstance dependence including opioid type drug, continuous use (HCC) Active Problems:   Altered mental status   Acute respiratory failure with hypoxia (HCC)   Back pain   Hypokalemia   Bacteremia due to methicillin resistant Staphylococcus aureus   Total Time spent with patient: 45 minutes  Subjective:   Susan Terry is a 46 y.o. female patient admitted with "I need something for anxiety".  HPI: Patient seen and chart reviewed.  46 year old woman with a history of intravenous opiate use disorder brought into the hospital with acute back pain found to have osteomyelitis now receiving intravenous antibiotics.  Patient was started on oral methadone at some early point in her hospitalization which has continued.  She has also received Dilaudid as a as needed medicine for withdrawal.  What she is complaining of now is that starting at nighttime and into the following morning she will feel extremely anxious.  She says she will feel shaky and nervous all over.  She denies most of the typical symptoms of opiate withdrawal and says that her pain is only a mild ache.  She says by midday she will be "sneezing" which is the only really specific opiate withdrawal symptoms she mentions.  Patient says all she needs is "something" for anxiety.  To control these symptoms.  She says her goal is to be off of all opiates by the time she is discharged from the hospital because she will be going to [facility] when she is discharged from the hospital.  Denies  depression.  Denies suicidal thoughts.  Denies psychosis.  Past Psychiatric History: Long history of polysubstance use including opiate use disorder.  Patient says she has been on both prescription methadone and Suboxone in the past.  Risk to Self:   Risk to Others:   Prior Inpatient Therapy:   Prior Outpatient Therapy:    Past Medical History:  Past Medical History:  Diagnosis Date   Drug abuse (HCC)    Osteomyelitis (HCC)     Past Surgical History:  Procedure Laterality Date   BACK SURGERY     INCISION AND DRAINAGE FOREARM / WRIST DEEP Right    LUMBAR LAMINECTOMY FOR EPIDURAL ABSCESS Left 08/16/2021   Procedure: LUMBAR LAMINECTOMY FOR EPIDURAL ABSCESS;  Surgeon: Venetia Night, MD;  Location: [facility] ORS;  Service: Neurosurgery;  Laterality: Left;   Family History:  Family History  Problem Relation Age of Onset   Cancer Mother        cancer throughout family   Family Psychiatric  History: Denies Social History:  Social History   Substance and Sexual Activity  Alcohol Use No     Social History   Substance and Sexual Activity  Drug Use Not Currently   Types: Methamphetamines, IV   Comment: heroin    Social History   Socioeconomic History   Marital status: Single    Spouse name: Not on file   Number of children: Not on file   Years of education: Not on file   Highest education level: Not on file  Occupational History   Not on file  Tobacco Use   Smoking status: Every  Day    Packs/day: 0.50    Types: Cigarettes   Smokeless tobacco: Never  Vaping Use   Vaping Use: Former  Substance and Sexual Activity   Alcohol use: No   Drug use: Not Currently    Types: Methamphetamines, IV    Comment: heroin   Sexual activity: Never  Other Topics Concern   Not on file  Social History Narrative   Not on file   Social Determinants of Health   Financial Resource Strain: Not on file  Food Insecurity: Not on file  Transportation Needs: Not on file  Physical  Activity: Not on file  Stress: Not on file  Social Connections: Not on file   Additional Social History:    Allergies:  No Known Allergies  Labs:  Results for orders placed or performed during the hospital encounter of 08/05/21 (from the past 48 hour(s))  CK     Status: Abnormal   Collection Time: 08/19/21  6:44 AM  Result Value Ref Range   Total CK 9 (L) 38 - 234 U/L    Comment: Performed at [facility], [address] [place], [place] [address]  CBC     Status: Abnormal   Collection Time: 08/19/21  6:44 AM  Result Value Ref Range   WBC 6.1 4.0 - 10.5 K/uL   RBC 3.98 3.87 - 5.11 MIL/uL   Hemoglobin 10.1 (L) 12.0 - 15.0 g/dL   HCT 27.2 (L) 53.6 - 64.4 %   MCV 82.2 80.0 - 100.0 fL   MCH 25.4 (L) 26.0 - 34.0 pg   MCHC 30.9 30.0 - 36.0 g/dL   RDW 03.4 74.2 - 59.5 %   Platelets 309 150 - 400 K/uL   nRBC 0.0 0.0 - 0.2 %    Comment: Performed at [facility], [address] [place], [place] [address]  Basic metabolic panel     Status: Abnormal   Collection Time: 08/19/21  6:44 AM  Result Value Ref Range   Sodium 139 135 - 145 mmol/L   Potassium 4.3 3.5 - 5.1 mmol/L   Chloride 104 98 - 111 mmol/L   CO2 27 22 - 32 mmol/L   Glucose, Bld 93 70 - 99 mg/dL    Comment: Glucose reference range applies only to samples taken after fasting for at least 8 hours.   BUN 17 6 - 20 mg/dL   Creatinine, Ser 6.43 (H) 0.44 - 1.00 mg/dL   Calcium 9.0 8.9 - 32.9 mg/dL   GFR, Estimated 56 (L) >60 mL/min    Comment: (NOTE) Calculated using the CKD-EPI Creatinine Equation (2021)    Anion gap 8 5 - 15    Comment: Performed at [facility], [address] [place], [place] [address]    Current Facility-Administered Medications  Medication Dose Route Frequency Provider Last Rate Last Admin   0.9 %  sodium chloride infusion   Intravenous PRN Venetia Night, MD 10 mL/hr at 08/19/21 0546 New Bag at 08/19/21 0546   acetaminophen (TYLENOL) tablet 650 mg  650 mg Oral  Q6H PRN Venetia Night, MD   650 mg at 08/17/21 0254   Or   acetaminophen (TYLENOL) suppository 650 mg  650 mg Rectal Q6H PRN Venetia Night, MD       ceftaroline (TEFLARO) 600 mg in sodium chloride 0.9 % 100 mL IVPB  600 mg Intravenous Q8H Venetia Night, MD 100 mL/hr at 08/20/21 1320 600 mg at 08/20/21 1320   clonazePAM (KLONOPIN) tablet 0.25 mg  0.25 mg Oral BID Jamire Shabazz T, MD       DAPTOmycin (CUBICIN) 700 mg in sodium chloride 0.9 % IVPB  700 mg Intravenous Q2000 Lynn Ito, MD 128 mL/hr at 08/19/21 2144 700 mg at 08/19/21 2144   enoxaparin (LOVENOX) injection 40 mg  40 mg Subcutaneous Q24H Venetia Night, MD   40 mg at 08/08/21 4098   ibuprofen (ADVIL) tablet 400 mg  400 mg Oral Q6H PRN Venetia Night, MD   400 mg at 08/19/21 2142   lidocaine (LIDODERM) 5 % 1 patch  1 patch Transdermal Q24H Venetia Night, MD       magnesium hydroxide (MILK OF MAGNESIA) suspension 30 mL  30 mL Oral Daily PRN Venetia Night, MD       methadone (DOLOPHINE) tablet 20 mg  20 mg Oral Daily Venetia Night, MD   20 mg at 08/20/21 0800   ondansetron (ZOFRAN) tablet 4 mg  4 mg Oral Q6H PRN Venetia Night, MD       Or   ondansetron [facility]) injection 4 mg  4 mg Intravenous Q6H PRN Venetia Night, MD   4 mg at 08/13/21 0421   Oral care mouth rinse  15 mL Mouth Rinse PRN Venetia Night, MD       pregabalin (LYRICA) capsule 25 mg  25 mg Oral BID Venetia Night, MD   25 mg at 08/20/21 1191   thiamine tablet 100 mg  100 mg Oral Daily Venetia Night, MD   100 mg at 08/20/21 4782   traZODone (DESYREL) tablet 25 mg  25 mg Oral QHS PRN Venetia Night, MD   25 mg at 08/19/21 2141    Musculoskeletal: Strength & Muscle Tone: within normal limits Gait & Station: normal Patient leans: N/A            Psychiatric Specialty Exam:  Presentation  General Appearance: No data recorded Eye Contact:No data recorded Speech:No data recorded Speech Volume:No  data recorded Handedness:No data recorded  Mood and Affect  Mood:No data recorded Affect:No data recorded  Thought Process  Thought Processes:No data recorded Descriptions of Associations:No data recorded Orientation:No data recorded Thought Content:No data recorded History of Schizophrenia/Schizoaffective disorder:No data recorded Duration of Psychotic Symptoms:No data recorded Hallucinations:No data recorded Ideas of Reference:No data recorded Suicidal Thoughts:No data recorded Homicidal Thoughts:No data recorded  Sensorium  Memory:No data recorded Judgment:No data recorded Insight:No data recorded  Executive Functions  Concentration:No data recorded Attention Span:No data recorded Recall:No data recorded Fund of Knowledge:No data recorded Language:No data recorded  Psychomotor Activity  Psychomotor Activity:No data recorded  Assets  Assets:No data recorded  Sleep  Sleep:No data recorded  Physical Exam: Physical Exam Vitals and nursing note reviewed.  Constitutional:      Appearance: Normal appearance.  HENT:     Head: Normocephalic and atraumatic.     Mouth/Throat:     Pharynx: Oropharynx is clear.  Eyes:     Pupils: Pupils are equal, round, and reactive to light.  Cardiovascular:     Rate and Rhythm: Normal rate and regular rhythm.  Pulmonary:     Effort: Pulmonary effort is normal.     Breath sounds: Normal breath sounds.  Abdominal:     General: Abdomen is flat.     Palpations: Abdomen is soft.  Musculoskeletal:        General: Normal range of motion.  Skin:    General: Skin is warm and dry.  Neurological:     General: No focal deficit present.  Mental Status: She is alert. Mental status is at baseline.  Psychiatric:        Attention and Perception: Attention normal.        Mood and Affect: Mood normal.        Speech: Speech normal.        Behavior: Behavior normal.        Thought Content: Thought content normal.        Cognition and  Memory: Cognition normal.    Review of Systems  Constitutional: Negative.   HENT: Negative.    Eyes: Negative.   Respiratory: Negative.    Cardiovascular: Negative.   Gastrointestinal: Negative.   Musculoskeletal: Negative.   Skin: Negative.   Neurological: Negative.   Psychiatric/Behavioral:  Positive for substance abuse. The patient is nervous/anxious.    Blood pressure 114/65, pulse 78, temperature 98.6 F (37 C), resp. rate 17, height 5\' 7"  (1.702 m), weight 72.6 kg, SpO2 100 %. Body mass index is 25.06 kg/m.  Treatment Plan Summary: Medication management and Plan tried to engage patient in conversation about proper management of pain and opiate withdrawal.  I wanted to suggest to her that we try to gradually switch her over to Suboxone rather than methadone but she is very insistent that she wants to discontinue all opiates by tapering off of the methadone.  She is adamant that she needs medicine for anxiety instead.  I pointed out to her that benzodiazepines would not be acceptable at [facility].  She wanted to disagree with me about that but I am quite certain that a rehab center would not want her on benzodiazepines when she came in.  I will go ahead and put in an order instead for Vistaril 50 mg in the evening and morning twice a day for anxiety.  We can follow up as needed.  She wants to gradually continue tapering the methadone which is fine if she tolerates it.  Disposition: Patient does not meet criteria for psychiatric inpatient admission.  Caysen Whang [facility], MD 08/20/2021 5:04 PM

## 2021-08-20 NOTE — Progress Notes (Signed)
Mobility Specialist - Progress Note    08/20/21 0940  Mobility  Activity Ambulated independently in room;Ambulated independently to bathroom;Stood at bedside;Dangled on edge of bed  Level of Assistance Independent  Assistive Device None  Distance Ambulated (ft) 10 ft  Activity Response Tolerated well  $Mobility charge 1 Mobility    Pt supine in bed on RA upon arrival. Pt indep STS and ambulates to restroom. Pt did not complain of pain.   Terrilyn Saver  Mobility Specialist  08/20/2021

## 2021-08-21 LAB — AEROBIC/ANAEROBIC CULTURE W GRAM STAIN (SURGICAL/DEEP WOUND)
Culture: NO GROWTH
Gram Stain: NONE SEEN

## 2021-08-21 LAB — CREATININE, SERUM
Creatinine, Ser: 1.25 mg/dL — ABNORMAL HIGH (ref 0.44–1.00)
GFR, Estimated: 55 mL/min — ABNORMAL LOW (ref >60)

## 2021-08-21 NOTE — Progress Notes (Signed)
PROGRESS NOTE    Susan Terry  [id number] DOB: 1975/12/01 DOA: 08/05/2021 PCP: Merryl Hacker, No    Brief Narrative:  Susan Terry is a 46 y.o. female with medical history significant for cervical osteomyelitis, IV heroin use disorder, who presented to the hospital with acute onset of low back pain that started about a day prior to admission.  She has been using Percocet and IV heroin at home.  In route to the hospital, she became hypoxic and apneic and treatment and she was treated with Narcan.  7/20 refused lovenox. C/o back pain, states agreeable to washout to be done by neurosurgery. 7/21 pt was vaping in the room last night. Plan for surgery today 7/22 has discomfort where drain is. But overall feels better 7/23 discussed with patient about weaning off of IV pain meds i.e. Dilaudid.  Although she is asleep and not in any acute distress she says that she is in pain and not to do it.  I did tell her we will still have to continue weaning her off.  She reports being in pain today. 7/24 no overnight issues.  Denies having pain 7/25 told patient I am weaning her off of IV Dilaudid and she was fine with this. 7/26 creatinine is 1.25.  Has no complaints of pain  Consultants:  ID , neurosurgery   Procedures:   Antimicrobials:  Vancomycin -d/cd Daptomycin and cetaroline   Subject: Denies shortness of breath, back pain, or any other complaints   Objective: Vitals:   08/20/21 0854 08/20/21 1709 08/20/21 1932 08/21/21 0324  BP: 114/65 117/82 103/63 102/62  Pulse: 78 72 65 63  Resp: 17 17 16 15   Temp: 98.6 F (37 C) 98.4 F (36.9 C) 98.8 F (37.1 C) 98.4 F (36.9 C)  TempSrc:      SpO2: 100% 100% 99% 97%  Weight:      Height:       No intake or output data in the 24 hours ending 08/21/21 0806  Filed Weights   08/05/21 2255 08/10/21 0400 08/16/21 1554  Weight: 73 kg 71.8 kg 72.6 kg    Examination: Calm, NAD Cta no w/r Reg s1/s2 no gallop Soft benign +bs No  edema Aaoxox3  Mood and affect appropriate in current setting     Data Reviewed: I have personally reviewed following labs and imaging studies  CBC: Recent Labs  Lab 08/19/21 0644  WBC 6.1  HGB 10.1*  HCT 32.7*  MCV 82.2  PLT [id number]   Basic Metabolic Panel: Recent Labs  Lab 08/16/21 0414 08/19/21 0644 08/21/21 0424  NA 137 139  --   K 4.6 4.3  --   CL 104 104  --   CO2 27 27  --   GLUCOSE 97 93  --   BUN 16 17  --   CREATININE 1.17* 1.22* 1.25*  CALCIUM 9.2 9.0  --    GFR: Estimated Creatinine Clearance: 56.4 mL/min (A) (by C-G formula based on SCr of 1.25 mg/dL (H)). Liver Function Tests: No results for input(s): "AST", "ALT", "ALKPHOS", "BILITOT", "PROT", "ALBUMIN" in the last 168 hours. No results for input(s): "LIPASE", "AMYLASE" in the last 168 hours. No results for input(s): "AMMONIA" in the last 168 hours. Coagulation Profile: No results for input(s): "INR", "PROTIME" in the last 168 hours. Cardiac Enzymes: Recent Labs  Lab 08/19/21 0644  CKTOTAL 9*   BNP (last 3 results) No results for input(s): "PROBNP" in the last 8760 hours. HbA1C: No results for input(s): "HGBA1C"  in the last 72 hours. CBG: No results for input(s): "GLUCAP" in the last 168 hours. Lipid Profile: No results for input(s): "CHOL", "HDL", "LDLCALC", "TRIG", "CHOLHDL", "LDLDIRECT" in the last 72 hours. Thyroid Function Tests: No results for input(s): "TSH", "T4TOTAL", "FREET4", "T3FREE", "THYROIDAB" in the last 72 hours. Anemia Panel: No results for input(s): "VITAMINB12", "FOLATE", "FERRITIN", "TIBC", "IRON", "RETICCTPCT" in the last 72 hours. Sepsis Labs: No results for input(s): "PROCALCITON", "LATICACIDVEN" in the last 168 hours.  Recent Results (from the past 240 hour(s))  Aerobic/Anaerobic Culture w Gram Stain (surgical/deep wound)     Status: None (Preliminary result)   Collection Time: 08/16/21  6:28 PM   Specimen: PATH Other; Body Fluid  Result Value Ref Range Status    Specimen Description   Final    WOUND Performed at [facility] [address] [place], [place] [address]    Special Requests EPIDURAL ABSCESS  Final   Gram Stain NO WBC SEEN NO ORGANISMS SEEN   Final   Culture   Final    NO GROWTH 4 DAYS NO ANAEROBES ISOLATED; CULTURE IN PROGRESS FOR 5 DAYS Performed at [facility], [address], [place], [place] [address]    Report Status PENDING  Incomplete         Radiology Studies: No results found.      Scheduled Meds:  enoxaparin (LOVENOX) injection  40 mg Subcutaneous Q24H   hydrOXYzine  50 mg Oral BID   lidocaine  1 patch Transdermal Q24H   methadone  20 mg Oral Daily   pregabalin  25 mg Oral BID   thiamine  100 mg Oral Daily   Continuous Infusions:  sodium chloride 10 mL/hr at 08/19/21 0546   ceFTAROline (TEFLARO) IV 600 mg (08/21/21 0647)   DAPTOmycin (CUBICIN) 700 mg in sodium chloride 0.9 % IVPB 700 mg (08/20/21 2005)    Assessment & Plan:   Principal Problem:   Polysubstance dependence including opioid type drug, continuous use (HCC) Active Problems:   Altered mental status   Acute respiratory failure with hypoxia (HCC)   Back pain   Hypokalemia   Bacteremia due to methicillin resistant Staphylococcus aureus  MRSA bacteremia in a patient with IV drug use and history of cervical C5-C6 osteomyelitis H/o cervical and lumbar spine infection in the past-H/o L2-L3 fusion. -No evidence of vegetation on 2D echo.   --TEE will not change the course of rx per ID. Pt will need IV abxs 6 weeks 7/19 ID following IV vancomycin was discontinued started on daptomycin and Ceftaroline Repeat MRI shows new collection of fluid - Neurosurgery consulted per Dr. Lovenia Shuck chat it does not look like implants are involved 7/22 s/p washout by neurosurgery Dr. Marcell Barlow on 7/21 Starting to feel better. 7/23 mobilize 7/24 weaning off of Dilaudid, will change to 0.5mg  bid prn Drain removed 7/26 continue IV  antibiotics          Acute hypoxic respiratory failure in the setting for drug abuse and respiratory depression Resolved  Acute toxic metabolic encephalopathy (due to above):  Resolved   polysubstance drug abuse -- patient reports she is supposed to be admitted inpatient drug rehab program in [facility] July 19 -d/w pt will start on po Methadone (for Heroin withdrawal).  7/21 patient found vaping in the room.  Should not be doing this  7/24 tolerating methadone  7/25 dc iv Dilaudid.  We will consult psych to help with methadone or other withdrawal symptoms.although no evidence of withdrawal , however  patient wants her methadone dose to be increased since dilaudid decreased.  7/26 psychiatrist input was appreciated, started on Vistaril 50 mg in the evening and morning twice daily for anxiety.  No benzodiazepine.  Patient wants to gradually continue tapering the methadone which she is fine with psychiatry if she tolerated        DVT prophylaxis: Lovenox  Code Status: Full Family Communication: None at bedside Disposition Plan: Back home Status is: Inpatient Remains inpatient appropriate because: IV treatment, needs prolonged IV antibiotics for MRSA cervical discitis.  Patient with history of drug abuse        LOS: 15 days   Time spent: 35 min    Lynn Ito, MD [facility] Pager [phone]  If 7PM-7AM, please contact night-coverage 08/21/2021, 8:06 AM

## 2021-08-21 NOTE — Plan of Care (Signed)

## 2021-08-22 LAB — CREATININE, SERUM
Creatinine, Ser: 1.22 mg/dL — ABNORMAL HIGH (ref 0.44–1.00)
GFR, Estimated: 56 mL/min — ABNORMAL LOW (ref >60)

## 2021-08-22 MED ORDER — CLONAZEPAM 1 MG PO TABS
0.5000 mg | ORAL_TABLET | Freq: Three times a day (TID) | ORAL | Status: DC
  Administered 2021-08-22 – 2021-08-27 (×15): 0.5 mg via ORAL
  Filled 2021-08-22 (×15): qty 1

## 2021-08-22 NOTE — Progress Notes (Signed)
Susan Terry PROGRESS NOTE    Susan Terry  IRJ:[id number] DOB: 12/17/1975 DOA: 08/05/2021 PCP: Oneita Hurt, No    Brief Narrative:  Susan Terry is a 46 y.o. female with medical history significant for cervical osteomyelitis, IV heroin use disorder, who presented to the hospital with acute onset of low back pain that started about a day prior to admission.  She has been using Percocet and IV heroin at home.  In route to the hospital, she became hypoxic and apneic and treatment and she was treated with Narcan.  She was found with MRSA bacteremia and osteomyelitis.  Due to her IV drug habits will be here to complete the course.  Neurosurgery was consulted and she is status post washout on 7/21.  The drain has been removed.  She is tapered off of Dilaudid and only on p.o. meds.  Psychiatry was consulted due to her feeling like she was going through withdrawal. Plan is to keep her until she completes IV antibiotics.  7/21 pt was vaping in the room last night. 7/26 creatinine is 1.25.  Has no complaints of pain    Consultants:  ID , neurosurgery, psychiatry  Procedures:   Antimicrobials:  Vancomycin -d/cd Daptomycin and cetaroline   Subject: Denies shortness of breath, chest pain or dizziness.  Denies back pain.  Would like to talk to psychiatry again.   Objective: Vitals:   08/21/21 1550 08/21/21 2007 08/22/21 0413 08/22/21 0803  BP: 114/71 111/73 113/72 107/66  Pulse: 66 71 (!) 58 66  Resp: 16 14 16    Temp: 99.4 F (37.4 C) 98.8 F (37.1 C) 98.5 F (36.9 C)   TempSrc:      SpO2: 100% 98% 97% 99%  Weight:      Height:        Intake/Output Summary (Last 24 hours) at 08/22/2021 0829 Last data filed at 08/22/2021 0517 Gross per 24 hour  Intake 199.79 ml  Output --  Net 199.79 ml    Filed Weights   08/05/21 2255 08/10/21 0400 08/16/21 1554  Weight: 73 kg 71.8 kg 72.6 kg    Examination: Calm, NAD Cta no w/r Reg s1/s2 no gallop Soft benign +bs Back:dressing on , no  erythema No edema Aaoxox3  Mood and affect appropriate in current setting     Data Reviewed: I have personally reviewed following labs and imaging studies  CBC: Recent Labs  Lab 08/19/21 0644  WBC 6.1  HGB 10.1*  HCT 32.7*  MCV 82.2  PLT 309   Basic Metabolic Panel: Recent Labs  Lab 08/16/21 0414 08/19/21 0644 08/21/21 0424 08/22/21 0401  NA 137 139  --   --   K 4.6 4.3  --   --   CL 104 104  --   --   CO2 27 27  --   --   GLUCOSE 97 93  --   --   BUN 16 17  --   --   CREATININE 1.17* 1.22* 1.25* 1.22*  CALCIUM 9.2 9.0  --   --    GFR: Estimated Creatinine Clearance: 57.8 mL/min (A) (by C-G formula based on SCr of 1.22 mg/dL (H)). Liver Function Tests: No results for input(s): "AST", "ALT", "ALKPHOS", "BILITOT", "PROT", "ALBUMIN" in the last 168 hours. No results for input(s): "LIPASE", "AMYLASE" in the last 168 hours. No results for input(s): "AMMONIA" in the last 168 hours. Coagulation Profile: No results for input(s): "INR", "PROTIME" in the last 168 hours. Cardiac Enzymes: Recent Labs  Lab 08/19/21  0644  CKTOTAL 9*   BNP (last 3 results) No results for input(s): "PROBNP" in the last 8760 hours. HbA1C: No results for input(s): "HGBA1C" in the last 72 hours. CBG: No results for input(s): "GLUCAP" in the last 168 hours. Lipid Profile: No results for input(s): "CHOL", "HDL", "LDLCALC", "TRIG", "CHOLHDL", "LDLDIRECT" in the last 72 hours. Thyroid Function Tests: No results for input(s): "TSH", "T4TOTAL", "FREET4", "T3FREE", "THYROIDAB" in the last 72 hours. Anemia Panel: No results for input(s): "VITAMINB12", "FOLATE", "FERRITIN", "TIBC", "IRON", "RETICCTPCT" in the last 72 hours. Sepsis Labs: No results for input(s): "PROCALCITON", "LATICACIDVEN" in the last 168 hours.  Recent Results (from the past 240 hour(s))  Aerobic/Anaerobic Culture w Gram Stain (surgical/deep wound)     Status: None   Collection Time: 08/16/21  6:28 PM   Specimen: PATH Other;  Body Fluid  Result Value Ref Range Status   Specimen Description   Final    WOUND Performed at [facility], [address] [place], [place] [address]    Special Requests EPIDURAL ABSCESS  Final   Gram Stain NO WBC SEEN NO ORGANISMS SEEN   Final   Culture   Final    No growth aerobically or anaerobically. Performed at [facility], [address], [place], [place] [address]    Report Status 08/21/2021 FINAL  Final         Radiology Studies: No results found.      Scheduled Meds:  enoxaparin (LOVENOX) injection  40 mg Subcutaneous Q24H   hydrOXYzine  50 mg Oral BID   lidocaine  1 patch Transdermal Q24H   methadone  20 mg Oral Daily   pregabalin  25 mg Oral BID   thiamine  100 mg Oral Daily   Continuous Infusions:  sodium chloride Stopped (08/19/21 0712)   DAPTOmycin (CUBICIN) 700 mg in sodium chloride 0.9 % IVPB Stopped (08/21/21 2050)    Assessment & Plan:   Principal Problem:   Polysubstance dependence including opioid type drug, continuous use (HCC) Active Problems:   Altered mental status   Acute respiratory failure with hypoxia (HCC)   Back pain   Hypokalemia   Bacteremia due to methicillin resistant Staphylococcus aureus  MRSA bacteremia in a patient with IV drug use and history of cervical C5-C6 osteomyelitis H/o cervical and lumbar spine infection in the past-H/o L2-L3 fusion. -No evidence of vegetation on 2D echo.   --TEE will not change the course of rx per ID. Pt will need IV abxs 6 weeks 7/19 ID following IV vancomycin was discontinued started on daptomycin and Ceftaroline Repeat MRI shows new collection of fluid - Neurosurgery consulted per Dr. Lovenia Shuck chat it does not look like implants are involved 7/21s/p washout by neurosurgery Dr. Marcell Barlow  7/24 drain removed 7/27 continue iv abx.  Monitor lab.renal in am          Acute hypoxic respiratory failure in the setting for drug abuse and respiratory  depression Resolved   Acute toxic metabolic encephalopathy (due to above):  Resolved   polysubstance drug abuse -- patient reports she is supposed to be admitted inpatient drug rehab program in [facility] July 19 -d/w pt will start on po Methadone (for Heroin withdrawal).  7/21 patient found vaping in the room.  Should not be doing this  7/25 dc iv Dilaudid.  We will consult psych to help with methadone or other withdrawal symptoms.although no evidence of withdrawal , however patient wants her methadone dose to be increased since dilaudid decreased.  7/26 psychiatrist input was appreciated, started on Vistaril 50 mg in the evening and morning twice daily for anxiety.  No benzodiazepine.  Patient wants to gradually continue tapering the methadone which she is fine with psychiatry if she tolerated 7/27 continue methadone.  Asked psychiatry to see patient again as she is requesting to talk to them        DVT prophylaxis: Lovenox  Code Status: Full Family Communication: None at bedside Disposition Plan: Back home Status is: Inpatient Remains inpatient appropriate because: IV treatment, needs prolonged IV antibiotics for MRSA cervical discitis.  Patient with history of drug abuse        LOS: 16 days   Time spent: 35 min    Lynn Ito, MD [facility] Pager [phone]  If 7PM-7AM, please contact night-coverage 08/22/2021, 8:29 AM

## 2021-08-22 NOTE — Progress Notes (Signed)
ID Doing better Pain better   O/e Patient Vitals for the past 24 hrs:  BP Temp Pulse Resp SpO2  08/22/21 0803 107/66 -- 66 -- 99 %  08/22/21 0413 113/72 98.5 F (36.9 C) (!) 58 16 97 %  08/21/21 2007 111/73 98.8 F (37.1 C) 71 14 98 %  08/21/21 1550 114/71 99.4 F (37.4 C) 66 16 100 %   Chest b/l air entry Hss1s2 Abd soft Skin wounds and scars Lumbar surgical site covered with honeycomb Scars on her extremities  Labs    Latest Ref Rng & Units 08/19/2021    6:44 AM 08/14/2021    3:25 AM 08/06/2021    5:49 AM  CBC  WBC 4.0 - 10.5 K/uL 6.1  7.3  11.9   Hemoglobin 12.0 - 15.0 g/dL 41.9  37.9  02.4   Hematocrit 36.0 - 46.0 % 32.7  34.0  32.8   Platelets 150 - 400 K/uL 309  299  158        Latest Ref Rng & Units 08/22/2021    4:01 AM 08/21/2021    4:24 AM 08/19/2021    6:44 AM  CMP  Glucose 70 - 99 mg/dL   93   BUN 6 - 20 mg/dL   17   Creatinine 0.97 - 1.00 mg/dL 3.53  2.99  2.42   Sodium 135 - 145 mmol/L   139   Potassium 3.5 - 5.1 mmol/L   4.3   Chloride 98 - 111 mmol/L   104   CO2 22 - 32 mmol/L   27   Calcium 8.9 - 10.3 mg/dL   9.0     Imaging MRI Lumbar spine   Progressive L4-L5 and L5-s1 disc edema compatible with discitis/osteo 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  Impression/recommendation  MRSA bacteremia  with lumbar L4-L5 and L5-S1 infection S/p L5-S1 hemilaminectomy Was getting ceftaroline + dapto for 12 days- then ceftaroline has been DC- she is on dapto now Repeat BC neg  2 d echo from 7/11 no vegetation  Has been stopped  IVDA- polysubstance use  H/o cervical and lumbar spine infection in the past-  H/o L2-L3 fusion.  Discussed the management with patient and care team

## 2021-08-22 NOTE — Plan of Care (Signed)

## 2021-08-23 DIAGNOSIS — Z792 Long term (current) use of antibiotics: Secondary | ICD-10-CM

## 2021-08-23 DIAGNOSIS — Z981 Arthrodesis status: Secondary | ICD-10-CM

## 2021-08-23 DIAGNOSIS — F1910 Other psychoactive substance abuse, uncomplicated: Secondary | ICD-10-CM

## 2021-08-23 LAB — CREATININE, SERUM
Creatinine, Ser: 1.14 mg/dL — ABNORMAL HIGH (ref 0.44–1.00)
GFR, Estimated: >60 mL/min (ref >60)

## 2021-08-23 NOTE — Progress Notes (Signed)
Susan Terry PROGRESS NOTE    Jeslynn Hollander  YQM:[id number] DOB: [birth year] DOA: 08/05/2021 PCP: Oneita Hurt, No    Brief Narrative:  Markayla Reichart is a 46 y.o. female with medical history significant for cervical osteomyelitis, IV heroin use disorder, who presented to the hospital with acute onset of low back pain that started about a day prior to admission.  She has been using Percocet and IV heroin at home.  In route to the hospital, she became hypoxic and apneic and treatment and she was treated with Narcan.  She was found with MRSA bacteremia and osteomyelitis.  Due to her IV drug habits will be here to complete the course.  Neurosurgery was consulted and she is status post washout on 7/21.  The drain has been removed.  She is tapered off of Dilaudid and only on p.o. meds.  Psychiatry was consulted due to her feeling like she was going through withdrawal. Plan is to keep her until she completes IV antibiotics.  7/21 pt was vaping in the room last night. 7/25 psychiatry consulted as patient was being tapered off IV pain meds. 7/26 creatinine is 1.25.  Has no complaints of pain 7/28 cr 1.14    Consultants:  ID , neurosurgery, psychiatry  Procedures:   Antimicrobials:  Vancomycin -d/cd Daptomycin and cetaroline   Subject: Denies shortness of breath, chest pain or dizziness.  Denies back pain.  Would like to talk to psychiatry again.   Objective: Vitals:   08/22/21 0413 08/22/21 0803 08/22/21 1930 08/23/21 0327  BP: 113/72 107/66 112/64 112/76  Pulse: (!) 58 66 69 71  Resp: 16  16 16   Temp: 98.5 F (36.9 C)  99.1 F (37.3 C) 99.4 F (37.4 C)  TempSrc:   Oral Oral  SpO2: 97% 99% 99% 98%  Weight:      Height:        Intake/Output Summary (Last 24 hours) at 08/23/2021 0808 Last data filed at 08/23/2021 0446 Gross per 24 hour  Intake --  Output 0 ml  Net 0 ml    Filed Weights   08/05/21 2255 08/10/21 0400 08/16/21 1554  Weight: 73 kg 71.8 kg 72.6 kg     Examination: Calm, NAD Cta no w/r Reg s1/s2 no gallop Soft benign +bs No edema Aaoxox3  Mood and affect appropriate in current setting     Data Reviewed: I have personally reviewed following labs and imaging studies  CBC: Recent Labs  Lab 08/19/21 0644  WBC 6.1  HGB 10.1*  HCT 32.7*  MCV 82.2  PLT 309   Basic Metabolic Panel: Recent Labs  Lab 08/19/21 0644 08/21/21 0424 08/22/21 0401 08/23/21 0408  NA 139  --   --   --   K 4.3  --   --   --   CL 104  --   --   --   CO2 27  --   --   --   GLUCOSE 93  --   --   --   BUN 17  --   --   --   CREATININE 1.22* 1.25* 1.22* 1.14*  CALCIUM 9.0  --   --   --    GFR: Estimated Creatinine Clearance: 61.9 mL/min (A) (by C-G formula based on SCr of 1.14 mg/dL (H)). Liver Function Tests: No results for input(s): "AST", "ALT", "ALKPHOS", "BILITOT", "PROT", "ALBUMIN" in the last 168 hours. No results for input(s): "LIPASE", "AMYLASE" in the last 168 hours. No results for input(s): "AMMONIA" in  the last 168 hours. Coagulation Profile: No results for input(s): "INR", "PROTIME" in the last 168 hours. Cardiac Enzymes: Recent Labs  Lab 08/19/21 0644  CKTOTAL 9*   BNP (last 3 results) No results for input(s): "PROBNP" in the last 8760 hours. HbA1C: No results for input(s): "HGBA1C" in the last 72 hours. CBG: No results for input(s): "GLUCAP" in the last 168 hours. Lipid Profile: No results for input(s): "CHOL", "HDL", "LDLCALC", "TRIG", "CHOLHDL", "LDLDIRECT" in the last 72 hours. Thyroid Function Tests: No results for input(s): "TSH", "T4TOTAL", "FREET4", "T3FREE", "THYROIDAB" in the last 72 hours. Anemia Panel: No results for input(s): "VITAMINB12", "FOLATE", "FERRITIN", "TIBC", "IRON", "RETICCTPCT" in the last 72 hours. Sepsis Labs: No results for input(s): "PROCALCITON", "LATICACIDVEN" in the last 168 hours.  Recent Results (from the past 240 hour(s))  Aerobic/Anaerobic Culture w Gram Stain (surgical/deep wound)      Status: None   Collection Time: 08/16/21  6:28 PM   Specimen: PATH Other; Body Fluid  Result Value Ref Range Status   Specimen Description   Final    WOUND Performed at [facility], [address] [place], [place] [address]    Special Requests EPIDURAL ABSCESS  Final   Gram Stain NO WBC SEEN NO ORGANISMS SEEN   Final   Culture   Final    No growth aerobically or anaerobically. Performed at [facility], [address] [place], [place] [address]    Report Status 08/21/2021 FINAL  Final         Radiology Studies: No results found.      Scheduled Meds:  clonazePAM  0.5 mg Oral TID   enoxaparin (LOVENOX) injection  40 mg Subcutaneous Q24H   hydrOXYzine  50 mg Oral BID   lidocaine  1 patch Transdermal Q24H   methadone  20 mg Oral Daily   pregabalin  25 mg Oral BID   thiamine  100 mg Oral Daily   Continuous Infusions:  sodium chloride Stopped (08/19/21 0712)   DAPTOmycin (CUBICIN) 700 mg in sodium chloride 0.9 % IVPB 700 mg (08/22/21 2111)    Assessment & Plan:   Principal Problem:   Polysubstance dependence including opioid type drug, continuous use (HCC) Active Problems:   Altered mental status   Acute respiratory failure with hypoxia (HCC)   Back pain   Hypokalemia   Bacteremia due to methicillin resistant Staphylococcus aureus  MRSA bacteremia in a patient with IV drug use and history of cervical C5-C6 osteomyelitis H/o cervical and lumbar spine infection in the past-H/o L2-L3 fusion. -No evidence of vegetation on 2D echo.   --TEE will not change the course of rx per ID. Pt will need IV abxs 6 weeks 7/19 ID following IV vancomycin was discontinued started on daptomycin and Ceftaroline Repeat MRI shows new collection of fluid - Neurosurgery consulted per Dr. Lovenia Shuck chat it does not look like implants are involved 7/21s/p washout by neurosurgery Dr. Marcell Barlow  7/24 drain removed 7/28 continue IV antibiotics Was Getting  ceftaroline plus daptomycin for 12 days-then ceftaroline has been Dc'd-she is on daptomycin now Repeat bcx negative           Acute hypoxic respiratory failure in the setting for drug abuse and respiratory depression Resolved   Acute toxic metabolic encephalopathy (due to above):  Resolved   polysubstance drug abuse -- patient reports she is supposed to be admitted inpatient drug rehab program in [facility] July 19 -d/w pt will start on po Methadone (for Heroin withdrawal).  7/21 patient found vaping in the room.  Should not be doing this  7/25 dc iv Dilaudid.  We will consult psych to help with methadone or other withdrawal symptoms.although no evidence of withdrawal , however patient wants her methadone dose to be increased since dilaudid decreased.  7/26 psychiatrist input was appreciated, started on Vistaril 50 mg in the evening and morning twice daily for anxiety.  No benzodiazepine.  Patient wants to gradually continue tapering the methadone which she is fine with psychiatry if she tolerated 7/28 continue methadone  Asked psychiatry to see patient again as she is requesting to talk to the           DVT prophylaxis: Lovenox  Code Status: Full Family Communication: None at bedside Disposition Plan: Back home Status is: Inpatient Remains inpatient appropriate because: IV treatment, needs prolonged IV antibiotics for MRSA cervical discitis.  Patient with history of drug abuse        LOS: 17 days   Time spent: 35 min    Lynn Ito, MD [facility] Pager [phone]  If 7PM-7AM, please contact night-coverage 08/23/2021, 8:08 AM

## 2021-08-23 NOTE — Progress Notes (Signed)
Pt refused to have me look at her dressing on her back, states MD saw it today.

## 2021-08-23 NOTE — Progress Notes (Signed)
ID Pt in bed most of the time Does not walk Says pain under better control    O/e Patient Vitals for the past 24 hrs:  BP Temp Temp src Pulse Resp SpO2  08/23/21 1549 111/66 98.4 F (36.9 C) -- 64 16 --  08/23/21 0824 101/69 98.7 F (37.1 C) -- 63 18 98 %  08/23/21 0327 112/76 99.4 F (37.4 C) Oral 71 16 98 %  08/22/21 1930 112/64 99.1 F (37.3 C) Oral 69 16 99 %   Chest b/l air entry Hss1s2 Abd soft Skin wounds and scars Lumbar surgical site covered with honeycomb Scars on her extremities  Labs    Latest Ref Rng & Units 08/19/2021    6:44 AM 08/14/2021    3:25 AM 08/06/2021    5:49 AM  CBC  WBC 4.0 - 10.5 K/uL 6.1  7.3  11.9   Hemoglobin 12.0 - 15.0 g/dL 10.1  10.7  10.3   Hematocrit 36.0 - 46.0 % 32.7  34.0  32.8   Platelets 150 - 400 K/uL 309  299  158        Latest Ref Rng & Units 08/23/2021    4:08 AM 08/22/2021    4:01 AM 08/21/2021    4:24 AM  CMP  Creatinine 0.44 - 1.00 mg/dL 1.14  1.22  1.25     Imaging MRI Lumbar spine   Progressive L4-L5 and L5-s1 disc edema compatible with discitis/osteo 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  Impression/recommendation  MRSA bacteremia  with lumbar L4-L5 and L5-S1 infection S/p L5-S1 hemilaminectomy Was getting ceftaroline + dapto for 12 days- then ceftaroline has been DC- she is now on Dapto Repeat BC neg Will need total of 6 weeks of Iv 09/20/21  followed by PO Will check ESR /CRP trend  AKI- improving since stopping NSAID Also stopped ceftaroline at the same time   IVDA- polysubstance use  H/o cervical and lumbar spine infection in the past-  H/o L2-L3 fusion.  Discussed the management with patient and care team ID will followher peripherally this weekend- call if needed

## 2021-08-24 LAB — C-REACTIVE PROTEIN: CRP: 1.8 mg/dL — ABNORMAL HIGH (ref <1.0)

## 2021-08-24 LAB — SEDIMENTATION RATE: Sed Rate: 87 mm/hr — ABNORMAL HIGH (ref 0–22)

## 2021-08-24 MED ORDER — METHADONE HCL 5 MG PO TABS
10.0000 mg | ORAL_TABLET | Freq: Every day | ORAL | Status: DC
  Administered 2021-08-25 – 2021-08-27 (×3): 10 mg via ORAL
  Filled 2021-08-24 (×3): qty 2

## 2021-08-24 NOTE — Assessment & Plan Note (Signed)
Tapering methadone.  Appreciate psychiatry input. Continue meds for anxiety per psychiatry, she is agreeable for narcotics taper. 

## 2021-08-24 NOTE — Hospital Course (Addendum)
Susan Terry is a 46 y.o. female with medical history significant for cervical osteomyelitis, IV heroin use disorder, who presented to the hospital with acute onset of low back pain that started about a day prior to admission.  She has been using Percocet and IV heroin at home.  In route to the hospital, she became hypoxic and apneic and treatment and she was treated with Narcan.  She was found with MRSA bacteremia and osteomyelitis.  Due to her IV drug habits will be here to complete the course.  Neurosurgery was consulted and she is status post washout on 7/21.  The drain has been removed.  She is tapered off of Dilaudid and only on p.o. meds.  Psychiatry was consulted due to her feeling like she was going through withdrawal. Plan is to keep her until she completes IV antibiotics.   7/21 pt was vaping in the room last night. 7/25 psychiatry consulted as patient was being tapered off IV pain meds. 7/26-8/1: IV Dapto to 8/10 followed by 1 dose of long-acting dalbavancin followed by linezolid per ID as patient wants to go to drug rehab at [facility]

## 2021-08-24 NOTE — Assessment & Plan Note (Addendum)
Continue IV daptomycin per ID.  She will need IV antibiotics for total 6 weeks till 09/20/2021 CRP improving

## 2021-08-24 NOTE — Progress Notes (Signed)
  Progress Note   Patient: Susan Terry WUJ:[id number] DOB: [birth year] DOA: 08/05/2021     18 DOS: the patient was seen and examined on 08/24/2021   Brief hospital course: Susan Terry is a 46 y.o. female with medical history significant for cervical osteomyelitis, IV heroin use disorder, who presented to the hospital with acute onset of low back pain that started about a day prior to admission.  She has been using Percocet and IV heroin at home.  In route to the hospital, she became hypoxic and apneic and treatment and she was treated with Narcan.  She was found with MRSA bacteremia and osteomyelitis.  Due to her IV drug habits will be here to complete the course.  Neurosurgery was consulted and she is status post washout on 7/21.  The drain has been removed.  She is tapered off of Dilaudid and only on p.o. meds.  Psychiatry was consulted due to her feeling like she was going through withdrawal. Plan is to keep her until she completes IV antibiotics.   7/21 pt was vaping in the room last night. 7/25 psychiatry consulted as patient was being tapered off IV pain meds. 7/26-7/29: Getting IV antibiotics, tapering methadone   Assessment and Plan: * Polysubstance dependence including opioid type drug, continuous use (HCC) Tapering methadone.  Appreciate psychiatry input.  Continue meds for anxiety per psychiatry, she is agreeable for narcotics taper  Altered mental status - This likely secondary to hypoxia and opiates abuse. - Monitor for opiate withdrawal  Acute respiratory failure with hypoxia (HCC) - This like secondary to opiates. - O2 protocol will be followed and will withdrawal oxygen as tolerated. - This could be contributing to her altered mental status  Back pain - On methadone  Bacteremia due to methicillin resistant Staphylococcus aureus Continue IV daptomycin per ID.  She will need IV antibiotics for total 6 weeks till 09/20/2021 CRP  improving  Hypokalemia Potassium replaced and magnesium level will be optimized.        Subjective: Pleasant and agreeable to start cutting back on narcotics for discharge planning  Physical Exam: Vitals:   08/23/21 1549 08/23/21 1955 08/24/21 0506 08/24/21 0817  BP: 111/66 112/74 109/69 106/71  Pulse: 64 72 62 66  Resp: 16 16 16 18   Temp: 98.4 F (36.9 C) 99 F (37.2 C) 98.6 F (37 C) 98.5 F (36.9 C)  TempSrc:  Oral Oral   SpO2:  99% 99% 98%  Weight:      Height:       46 year old female lying in the bed comfortably without any acute distress Lungs clear to auscultation bilaterally Cardiovascular regular rate and rhythm Abdomen soft, benign Neuro alert and oriented, nonfocal Psych normal mood and affect Data Reviewed:  There are no new results to review at this time.  Family Communication: None  Disposition: Status is: Inpatient Remains inpatient appropriate because: Getting IV antibiotics   Planned Discharge Destination: Home    DVT prophylaxis-Lovenox Time spent: 35 minutes  Author: 57, MD 08/24/2021 2:05 PM  For on call review [email or web address].

## 2021-08-25 LAB — CBC
HCT: 32.2 % — ABNORMAL LOW (ref 36.0–46.0)
Hemoglobin: 10.2 g/dL — ABNORMAL LOW (ref 12.0–15.0)
MCH: 25.5 pg — ABNORMAL LOW (ref 26.0–34.0)
MCHC: 31.7 g/dL (ref 30.0–36.0)
MCV: 80.5 fL (ref 80.0–100.0)
Platelets: 249 10*3/uL (ref 150–400)
RBC: 4 MIL/uL (ref 3.87–5.11)
RDW: 13.4 % (ref 11.5–15.5)
WBC: 5.7 10*3/uL (ref 4.0–10.5)
nRBC: 0 % (ref 0.0–0.2)

## 2021-08-25 LAB — BASIC METABOLIC PANEL
Anion gap: 8 (ref 5–15)
BUN: 23 mg/dL — ABNORMAL HIGH (ref 6–20)
CO2: 26 mmol/L (ref 22–32)
Calcium: 9.1 mg/dL (ref 8.9–10.3)
Chloride: 102 mmol/L (ref 98–111)
Creatinine, Ser: 1.05 mg/dL — ABNORMAL HIGH (ref 0.44–1.00)
GFR, Estimated: >60 mL/min (ref >60)
Glucose, Bld: 104 mg/dL — ABNORMAL HIGH (ref 70–99)
Potassium: 3.9 mmol/L (ref 3.5–5.1)
Sodium: 136 mmol/L (ref 135–145)

## 2021-08-25 NOTE — Assessment & Plan Note (Signed)
Repleted and resolved 

## 2021-08-25 NOTE — Assessment & Plan Note (Signed)
-   On methadone 

## 2021-08-25 NOTE — Progress Notes (Signed)
  Progress Note   Patient: Susan Terry [id number]:[id number] DOB: [birth year] DOA: 08/05/2021     19 DOS: the patient was seen and examined on 08/25/2021   Brief hospital course: Mellany Dinsmore is a 46 y.o. female with medical history significant for cervical osteomyelitis, IV heroin use disorder, who presented to the hospital with acute onset of low back pain that started about a day prior to admission.  She has been using Percocet and IV heroin at home.  In route to the hospital, she became hypoxic and apneic and treatment and she was treated with Narcan.  She was found with MRSA bacteremia and osteomyelitis.  Due to her IV drug habits will be here to complete the course.  Neurosurgery was consulted and she is status post washout on 7/21.  The drain has been removed.  She is tapered off of Dilaudid and only on p.o. meds.  Psychiatry was consulted due to her feeling like she was going through withdrawal. Plan is to keep her until she completes IV antibiotics.   7/21 pt was vaping in the room last night. 7/25 psychiatry consulted as patient was being tapered off IV pain meds. 7/26-7/30: Getting IV antibiotics, tapering methadone   Assessment and Plan: * Polysubstance dependence including opioid type drug, continuous use (HCC) Tapering methadone.  Appreciate psychiatry input. Continue meds for anxiety per psychiatry, she is agreeable for narcotics taper.  Altered mental status - This likely secondary to hypoxia and opiates abuse. - Monitor for opiate withdrawal.  Acute respiratory failure with hypoxia (HCC) - Present on admission and now resolved.  Patient is on room air  Back pain - On methadone.  Bacteremia due to methicillin resistant Staphylococcus aureus Continue IV daptomycin per ID.  She will need IV antibiotics for total 6 weeks till 09/20/2021 CRP improving.  Hypokalemia Repleted and resolved        Subjective: No new issues  Physical Exam: Vitals:   08/24/21  1543 08/24/21 1944 08/25/21 0454 08/25/21 0806  BP: (!) 92/50 107/68 96/65 106/70  Pulse: 66 72 62 73  Resp: 18 20 16 18   Temp: 98.5 F (36.9 C) 99.1 F (37.3 C) 98 F (36.7 C) 98.5 F (36.9 C)  TempSrc:  Oral Oral   SpO2: 98% 96% 96% 98%  Weight:      Height:       46 year old female lying in the bed comfortably without any acute distress Lungs clear to auscultation bilaterally Cardiovascular regular rate and rhythm Abdomen soft, benign Neuro alert and oriented, nonfocal Psych normal mood and affect  Data Reviewed:  Hemoglobin 10.2, creatinine 1.05  Family Communication: None  Disposition: Status is: Inpatient Remains inpatient appropriate because: Getting IV antibiotics for bacteremia   Planned Discharge Destination: Home    DVT prophylaxis-Lovenox Time spent: 35 minutes  Author: 55, MD 08/25/2021 12:31 PM  For on call review [email or web address].

## 2021-08-25 NOTE — Assessment & Plan Note (Signed)
-   This likely secondary to hypoxia and opiates abuse. - Monitor for opiate withdrawal 

## 2021-08-25 NOTE — Assessment & Plan Note (Signed)
Continue IV daptomycin per ID.  She will need IV antibiotics for total 6 weeks till 09/20/2021 CRP improving 

## 2021-08-25 NOTE — Assessment & Plan Note (Addendum)
Tapering methadone.  Appreciate psychiatry input. Continue meds for anxiety per psychiatry, she is agreeable for narcotics taper.

## 2021-08-25 NOTE — Assessment & Plan Note (Addendum)
-   Present on admission and now resolved.  Patient is on room air 

## 2021-08-26 LAB — CBC
HCT: 34.6 % — ABNORMAL LOW (ref 36.0–46.0)
Hemoglobin: 10.8 g/dL — ABNORMAL LOW (ref 12.0–15.0)
MCH: 25.2 pg — ABNORMAL LOW (ref 26.0–34.0)
MCHC: 31.2 g/dL (ref 30.0–36.0)
MCV: 80.8 fL (ref 80.0–100.0)
Platelets: 218 10*3/uL (ref 150–400)
RBC: 4.28 MIL/uL (ref 3.87–5.11)
RDW: 13.5 % (ref 11.5–15.5)
WBC: 8.1 10*3/uL (ref 4.0–10.5)
nRBC: 0 % (ref 0.0–0.2)

## 2021-08-26 LAB — CK: Total CK: 19 U/L — ABNORMAL LOW (ref 38–234)

## 2021-08-26 NOTE — Assessment & Plan Note (Signed)
-   Present on admission and now resolved.  Patient is on room air

## 2021-08-26 NOTE — Assessment & Plan Note (Signed)
-   On methadone

## 2021-08-26 NOTE — Assessment & Plan Note (Signed)
Repleted and resolved 

## 2021-08-26 NOTE — Progress Notes (Signed)
  Progress Note   Patient: Susan Terry CVE:[id number] DOB: Feb 15, 1975 DOA: 08/05/2021     20 DOS: the patient was seen and examined on 08/26/2021   Brief hospital course: Susan Terry is a 46 y.o. female with medical history significant for cervical osteomyelitis, IV heroin use disorder, who presented to the hospital with acute onset of low back pain that started about a day prior to admission.  She has been using Percocet and IV heroin at home.  In route to the hospital, she became hypoxic and apneic and treatment and she was treated with Narcan.  She was found with MRSA bacteremia and osteomyelitis.  Due to her IV drug habits will be here to complete the course.  Neurosurgery was consulted and she is status post washout on 7/21.  The drain has been removed.  She is tapered off of Dilaudid and only on p.o. meds.  Psychiatry was consulted due to her feeling like she was going through withdrawal. Plan is to keep her until she completes IV antibiotics.   7/21 pt was vaping in the room last night. 7/25 psychiatry consulted as patient was being tapered off IV pain meds. 7/26-7/31: Getting IV antibiotics, will be in the hospital till finish on 8/25 per ID   Assessment and Plan: * Polysubstance dependence including opioid type drug, continuous use (HCC) Continue methadone.  Appreciate psychiatry input. Continue meds for anxiety per psychiatry  Altered mental status - This likely secondary to hypoxia and opiates abuse. - Monitor for opiate withdrawal  Acute respiratory failure with hypoxia (HCC) - Present on admission and now resolved.  Patient is on room air  Back pain - On methadone  Bacteremia due to methicillin resistant Staphylococcus aureus Continue IV daptomycin per ID.  She will need IV antibiotics for total 6 weeks till 09/20/2021 CRP improving  Hypokalemia Repleted and resolved.        Subjective: No new issues  Physical Exam: Vitals:   08/25/21 1545  08/25/21 2039 08/26/21 0421 08/26/21 0800  BP: 120/71 124/75 112/77 110/67  Pulse: 77 84 83 85  Resp: 20 16 16 18   Temp: 98.8 F (37.1 C) 99.9 F (37.7 C) 98.8 F (37.1 C) 98.3 F (36.8 C)  TempSrc: Oral Oral Oral   SpO2: 99% 100% 99% 99%  Weight:      Height:       46 year old female lying in the bed comfortably without any acute distress Lungs clear to auscultation bilaterally Cardiovascular regular rate and rhythm Abdomen soft, benign Neuro alert and oriented, nonfocal Psych normal mood and affect  Data Reviewed:  There are no new results to review at this time.  Family Communication: None  Disposition: Status is: Inpatient Remains inpatient appropriate because: Getting IV antibiotics while in the hospital till 8/25 per ID   Planned Discharge Destination: Home    DVT prophylaxis-Lovenox Time spent: 15 minutes  Author: 9/25, MD 08/26/2021 2:38 PM  For on call review [email or web address].

## 2021-08-26 NOTE — Assessment & Plan Note (Signed)
-   This likely secondary to hypoxia and opiates abuse. - Monitor for opiate withdrawal 

## 2021-08-26 NOTE — Assessment & Plan Note (Signed)
Continue IV daptomycin per ID.  She will need IV antibiotics for total 6 weeks till 09/20/2021 CRP improving 

## 2021-08-26 NOTE — Assessment & Plan Note (Signed)
Continue methadone.  Appreciate psychiatry input. Continue meds for anxiety per psychiatry

## 2021-08-26 NOTE — Progress Notes (Signed)
ID Pt is doing fine Says she would like to go home when she finishes 4 weeks of antibiotic and not 6 weeks She wants to go to rehab in [facility] for substance use    O/e Patient Vitals for the past 24 hrs:  BP Temp Temp src Pulse Resp SpO2  08/26/21 0800 110/67 98.3 F (36.8 C) -- 85 18 99 %  08/26/21 0421 112/77 98.8 F (37.1 C) Oral 83 16 99 %  08/25/21 2039 124/75 99.9 F (37.7 C) Oral 84 16 100 %  08/25/21 1545 120/71 98.8 F (37.1 C) Oral 77 20 99 %   Chest b/l air entry [id number] Abd soft Skin wounds and scars Lumbar surgical site covered with honeycomb Scars on her extremities  Labs    Latest Ref Rng & Units 08/26/2021    5:19 AM 08/25/2021    3:30 AM 08/19/2021    6:44 AM  CBC  WBC 4.0 - 10.5 K/uL 8.1  5.7  6.1   Hemoglobin 12.0 - 15.0 g/dL 10.8  10.2  10.1   Hematocrit 36.0 - 46.0 % 34.6  32.2  32.7   Platelets 150 - 400 K/uL 218  249  309        Latest Ref Rng & Units 08/25/2021    3:30 AM 08/23/2021    4:08 AM 08/22/2021    4:01 AM  CMP  Glucose 70 - 99 mg/dL 104     BUN 6 - 20 mg/dL 23     Creatinine 0.44 - 1.00 mg/dL 1.05  1.14  1.22   Sodium 135 - 145 mmol/L 136     Potassium 3.5 - 5.1 mmol/L 3.9     Chloride 98 - 111 mmol/L 102     CO2 22 - 32 mmol/L 26     Calcium 8.9 - 10.3 mg/dL 9.1       Imaging MRI Lumbar spine   Progressive L4-L5 and L5-s1 disc edema compatible with discitis/osteo 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  Impression/recommendation  MRSA bacteremia  with lumbar L4-L5 and L5-S1 infection S/p L5-S1 hemilaminectomy Was getting ceftaroline + dapto for 12 days- then ceftaroline has been DC- she is now on Dapto Repeat BC neg Will need total of 6 weeks of Iv 09/20/21  But patient wants to go to drug rehab and would like to leave on 8/10 so will do 4 weeks of Iv dapto- then would give a dose of long acting dalbavancin . She as no po option available other than linezolid- followed by PO ESR 104 and crp 1.8  AKI- improving since  stopping NSAID Also stopped ceftaroline at the same time   IVDA- polysubstance use  H/o cervical and lumbar spine infection in the past-  H/o L2-L3 fusion.  Discussed the management with patient

## 2021-08-27 NOTE — Progress Notes (Signed)
  Progress Note   Patient: Susan Terry [id number] DOB: [birth year] DOA: 08/05/2021     21 DOS: the patient was seen and examined on 08/27/2021   Brief hospital course: Susan Terry is a 46 y.o. female with medical history significant for cervical osteomyelitis, IV heroin use disorder, who presented to the hospital with acute onset of low back pain that started about a day prior to admission.  She has been using Percocet and IV heroin at home.  In route to the hospital, she became hypoxic and apneic and treatment and she was treated with Narcan.  She was found with MRSA bacteremia and osteomyelitis.  Due to her IV drug habits will be here to complete the course.  Neurosurgery was consulted and she is status post washout on 7/21.  The drain has been removed.  She is tapered off of Dilaudid and only on p.o. meds.  Psychiatry was consulted due to her feeling like she was going through withdrawal. Plan is to keep her until she completes IV antibiotics.   7/21 pt was vaping in the room last night. 7/25 psychiatry consulted as patient was being tapered off IV pain meds. 7/26-8/1: IV Dapto to 8/10 followed by 1 dose of long-acting dalbavancin followed by linezolid per ID as patient wants to go to drug rehab at [facility]   Assessment and Plan: * Polysubstance dependence including opioid type drug, continuous use (HCC) Continue methadone.  Appreciate psychiatry input. Continue meds for anxiety per psychiatry.  Altered mental status - This likely secondary to hypoxia and opiates abuse. - Now at baseline  Acute respiratory failure with hypoxia (HCC) - Present on admission and now resolved.  Patient is on room air.  Back pain Continue methadone  Bacteremia due to methicillin resistant Staphylococcus aureus Patient wants to go drug rehab at [facility] so ID is planning to continue daptomycin till 8/10 followed by 1 dose of long-acting dalbavancin followed by oral  linezolid   Hypokalemia Repleted and resolved        Subjective: Feeling much better.  Requesting to get her release by next week (8/10) as she is hoping to go to substance abuse rehab  Physical Exam: Vitals:   08/26/21 0800 08/26/21 1615 08/26/21 1956 08/27/21 0802  BP: 110/67 113/71 105/71 120/80  Pulse: 85 94 82 77  Resp: 18 16 20 16   Temp: 98.3 F (36.8 C) 98.6 F (37 C) 98.5 F (36.9 C) 98.5 F (36.9 C)  TempSrc:      SpO2: 99% 96% 97% 100%  Weight:      Height:       46 year old female lying in the bed comfortably without any acute distress Lungs clear to auscultation bilaterally Cardiovascular regular rate and rhythm Abdomen soft, benign Neuro alert and oriented, nonfocal Psych normal mood and affect Data Reviewed:  There are no new results to review at this time.  Family Communication: None  Disposition: Status is: Inpatient Remains inpatient appropriate because: Getting IV antibiotics   Planned Discharge Destination: Home    DVT prophylaxis-Lovenox Time spent: 15 minutes  Author: 55, MD 08/27/2021 1:54 PM  For on call review [email or web address].

## 2021-08-27 NOTE — Assessment & Plan Note (Signed)
Continue methadone 

## 2021-08-27 NOTE — Assessment & Plan Note (Signed)
-   This likely secondary to hypoxia and opiates abuse. - Now at baseline

## 2021-08-27 NOTE — Progress Notes (Addendum)
Tele sitter called tech stating that tele sitter could not visualize patient. Upon entry to room it was found that patient was in bathroom vaping. Security called again to assist in talking with patient and removing vape. Patient began screaming stating that she felt like she did not have privacy and she was wanting to leave the hospital. This nurse notified Dr. Sherryll Burger and discussed with patient risk of leaving hospital against medical advice and encouraged patient to stay to continue to receive her IV antibiotics. Patient continued to refuse stating that she was leaving. Patient signed AMA paperwork and IV was removed. Security escorted patient off of unit.

## 2021-08-27 NOTE — Assessment & Plan Note (Signed)
Patient wants to go drug rehab at [facility] so ID is planning to continue daptomycin till 8/10 followed by 1 dose of long-acting dalbavancin followed by oral linezolid

## 2021-08-27 NOTE — Progress Notes (Signed)
Nurse tech called this nurse to room. Patient was very difficult to arouse. Would not wake by verbally calling name. Rapid response initiated. This nurse performed sternal rub and patient slowly aroused. Vital signs obtained. It was noted that empty IV syringe was found attached to patients IV. [facility] notified by Press photographer. When patient questioned how syringe got attached to IV pt stated she did not know that sometimes she flushes her own IV because she is afraid it will get clotted. This nurse informed her that if she wants it flushed she is to call her nurse and her nurse can flush it for her. Nursing explained safety concerns in regards to empty syringe being found attached to IV and how drowsy she is and asked if security could search her belongings. Patient verbally agreed to her belongings being searched by security. Security performed search and found multiple IV syringes in belongings some with liquid still inside. Per direction of [employer] for safety concerns telesitter initiated and process explained to patient. MD called into patients room per her request. Tele sitter in place.

## 2021-08-27 NOTE — Assessment & Plan Note (Signed)
Continue methadone.  Appreciate psychiatry input. Continue meds for anxiety per psychiatry 

## 2021-08-27 NOTE — Assessment & Plan Note (Signed)
-   Present on admission and now resolved.  Patient is on room air 

## 2021-08-27 NOTE — Assessment & Plan Note (Signed)
Repleted and resolved 

## 2021-08-27 NOTE — Progress Notes (Signed)
Rapid Response Event Note   Reason for Call : RN found empty syringe hooked to pt's IV, pt briefly unresponsive   Initial Focused Assessment: On my arrival pt is awake and sitting up in bed. Pt is talking and answering questions. Primary RN and charge RN states that pt was found minimally responsive and would barely respond to sternal rub. RN also states that she found empty saline flush syringe hooked to pt's IV and she had not left the syringe there. Pt's vital signs stable at this time. Pt says she does not recall hooking up syringe to IV but that she had flushed the IV on her own a couple times this admission. RN instructed her to call nurse if she thinks IV needs to be flushed. Pt gave security permission to search bags.   Interventions: AC and MD notified. Telesitter initiated at this time. All syringes removed from room.    Plan of Care: Pt remains on 2C.    Event Summary:   MD Notified: Dr. Sherryll Burger Call Time: 1733 Arrival Time: 1735 End Time: 1750  Henrene Dodge, RN

## 2021-08-27 NOTE — Final Progress Note (Signed)
See Rapid Response Event note for further details. I was notified. I talked with patient. At first she was agreeable to have tele sitter monitor for 24 hrs for her safety considering the event.  15 mins later, I was notified patient decided to leave AMA. She understands all the risks including death by not staying and getting appropriately treated.  I discussed her situation with nursing, charge RN, ID and hospital leadership.   Time spent - 20 mins

## 2021-08-28 ENCOUNTER — Other Ambulatory Visit: Payer: Self-pay

## 2021-08-29 ENCOUNTER — Other Ambulatory Visit: Payer: Self-pay

## 2021-08-29 ENCOUNTER — Encounter: Payer: Self-pay | Admitting: Neurosurgery

## 2021-08-29 MED ORDER — LINEZOLID 600 MG PO TABS
ORAL_TABLET | ORAL | 0 refills | Status: DC
  Filled 2021-08-29: qty 28, 14d supply, fill #0

## 2021-09-06 ENCOUNTER — Telehealth: Payer: Self-pay

## 2021-09-06 NOTE — Telephone Encounter (Signed)
Patient called stating she had some concerns for provider. First concerns was she had leg swelling on both legs two days after discharging hospital. Legs were painfully swollen and could not walk. Has since improved after one week. Is still having back pain. Has not noticed any changes since discharging home on antibiotics. Second concern is that she would like letter for probation officer. States she missed her appointment with them on Monday 8/3 due to leg swelling.  Patient has pictures of her legs for provider if she needs to see them . Will forward message to provider. Juanita Laster, RMA

## 2021-09-10 NOTE — Telephone Encounter (Signed)
Patient called back wanting to hear from provider.   States her back pain is getting worse and she feels like the infection is worsening and fluid is collecting. She does not want to go to the emergency department, but wants to discuss with provider.   She denies fever or chills. We discussed that if she develops fever or chills she will need to go to the ED, she is in agreement.   Will route to provider.   Sandie Ano, RN

## 2021-09-10 NOTE — Telephone Encounter (Signed)
Called patient, no answer. Left HIPAA compliant voicemail requesting callback.   Shakinah Navis D Matyas Baisley, RN  

## 2021-09-11 NOTE — Telephone Encounter (Signed)
Spoke with Susan Terry, she has been taking the linezolid and has a few pills left. Accepts appointment with Dr. Rivka Safer tomorrow at 11:30.  Sandie Ano, RN

## 2021-09-12 ENCOUNTER — Ambulatory Visit: Payer: Self-pay

## 2021-09-12 ENCOUNTER — Ambulatory Visit: Payer: Medicaid Other | Attending: Infectious Diseases | Admitting: Infectious Diseases

## 2021-09-12 ENCOUNTER — Other Ambulatory Visit
Admission: RE | Admit: 2021-09-12 | Discharge: 2021-09-12 | Disposition: A | Discharge status: Home / Self Care | Payer: Self-pay | Attending: Infectious Diseases | Admitting: Infectious Diseases

## 2021-09-12 ENCOUNTER — Encounter: Payer: Self-pay | Admitting: Infectious Diseases

## 2021-09-12 VITALS — BP 143/91 | HR 94 | Temp 98.3°F | Ht 67.0 in | Wt 156.0 lb

## 2021-09-12 DIAGNOSIS — M7989 Other specified soft tissue disorders: Secondary | ICD-10-CM | POA: Insufficient documentation

## 2021-09-12 DIAGNOSIS — I824Y3 Acute embolism and thrombosis of unspecified deep veins of proximal lower extremity, bilateral: Secondary | ICD-10-CM

## 2021-09-12 DIAGNOSIS — M549 Dorsalgia, unspecified: Secondary | ICD-10-CM | POA: Insufficient documentation

## 2021-09-12 DIAGNOSIS — M4620 Osteomyelitis of vertebra, site unspecified: Secondary | ICD-10-CM | POA: Insufficient documentation

## 2021-09-12 DIAGNOSIS — M4626 Osteomyelitis of vertebra, lumbar region: Secondary | ICD-10-CM

## 2021-09-12 DIAGNOSIS — R600 Localized edema: Secondary | ICD-10-CM | POA: Insufficient documentation

## 2021-09-12 DIAGNOSIS — Z981 Arthrodesis status: Secondary | ICD-10-CM | POA: Insufficient documentation

## 2021-09-12 DIAGNOSIS — Z8614 Personal history of Methicillin resistant Staphylococcus aureus infection: Secondary | ICD-10-CM | POA: Insufficient documentation

## 2021-09-12 LAB — CBC WITH DIFFERENTIAL/PLATELET
Abs Immature Granulocytes: 0.01 10*3/uL (ref 0.00–0.07)
Basophils Absolute: 0 10*3/uL (ref 0.0–0.1)
Basophils Relative: 0 %
Eosinophils Absolute: 0.1 10*3/uL (ref 0.0–0.5)
Eosinophils Relative: 1 %
HCT: 30.6 % — ABNORMAL LOW (ref 36.0–46.0)
Hemoglobin: 9.8 g/dL — ABNORMAL LOW (ref 12.0–15.0)
Immature Granulocytes: 0 %
Lymphocytes Relative: 27 %
Lymphs Abs: 1.5 10*3/uL (ref 0.7–4.0)
MCH: 25.6 pg — ABNORMAL LOW (ref 26.0–34.0)
MCHC: 32 g/dL (ref 30.0–36.0)
MCV: 79.9 fL — ABNORMAL LOW (ref 80.0–100.0)
Monocytes Absolute: 0.3 10*3/uL (ref 0.1–1.0)
Monocytes Relative: 6 %
Neutro Abs: 3.7 10*3/uL (ref 1.7–7.7)
Neutrophils Relative %: 66 %
Platelets: 178 10*3/uL (ref 150–400)
RBC: 3.83 MIL/uL — ABNORMAL LOW (ref 3.87–5.11)
RDW: 15 % (ref 11.5–15.5)
WBC: 5.6 10*3/uL (ref 4.0–10.5)
nRBC: 0 % (ref 0.0–0.2)

## 2021-09-12 LAB — COMPREHENSIVE METABOLIC PANEL
ALT: 14 U/L (ref 0–44)
AST: 21 U/L (ref 15–41)
Albumin: 3.7 g/dL (ref 3.5–5.0)
Alkaline Phosphatase: 81 U/L (ref 38–126)
Anion gap: 9 (ref 5–15)
BUN: 13 mg/dL (ref 6–20)
CO2: 26 mmol/L (ref 22–32)
Calcium: 9.3 mg/dL (ref 8.9–10.3)
Chloride: 101 mmol/L (ref 98–111)
Creatinine, Ser: 0.84 mg/dL (ref 0.44–1.00)
GFR, Estimated: >60 mL/min (ref >60)
Glucose, Bld: 132 mg/dL — ABNORMAL HIGH (ref 70–99)
Potassium: 3.3 mmol/L — ABNORMAL LOW (ref 3.5–5.1)
Sodium: 136 mmol/L (ref 135–145)
Total Bilirubin: 0.3 mg/dL (ref 0.3–1.2)
Total Protein: 8.1 g/dL (ref 6.5–8.1)

## 2021-09-12 LAB — C-REACTIVE PROTEIN: CRP: 1 mg/dL — ABNORMAL HIGH (ref <1.0)

## 2021-09-12 LAB — SEDIMENTATION RATE: Sed Rate: 47 mm/hr — ABNORMAL HIGH (ref 0–20)

## 2021-09-12 NOTE — Progress Notes (Signed)
NAME: Susan Terry  DOB: March 08, 1975  MRN: [id number]  Date/Time: 09/12/2021 12:17 PM  Subjective:   ? Susan Terry is a 46 y.o. female with a history  recurrent spinal vertebral infection,  multiple hospitalization for infections including cervical vertebral osteo involoving C5-C6 with epidural phlegmon upto C1 and treated with broad spectrum  ( vanco/cefepime) for 2 weeks in march 2022 when she wanted to leave and given linezolid and cipro PO for 3 weeks- serratia L2/L3 ostoemyelitis 6/202020 s/p fusion on 8/20 for pathological fracture, MRSA bacteremia with recurrent osteo and paraspinal abscess  11/22/18 and treated with IR drainage and 6 weeks of IV vanco was recently in [facility] between 7/10-7/31 and left AMA on 08/26/21 For MRSA bacteremia and  lumbar L4-L5 - L5-S1 infection for which she underwent L5-S1 hemi-lamencetomy on 08/16/21 Initially she had to get double MRSA coverage with ceftaroline + Dapto for 12 days and then dapto alone. The plan was to give for 6 weeks until 09/20/21. Pt wantted to leave initially on 09/05/21 and then take PO to complete course- She left AMA on 7/31 after she was found vaping in the room and syringes in her back She has ben taking linezolid regularly She is here because of swelling both legs and worsening back pain Says some movements made it worse , yesterday wa sin the yard and her leg swelling got worse and back started hurting Taking naprosyn every day No fever or chills Says she is detox now [place] with her mom Could not go for parole meeting because of leg swelling and wanted a letter for parole officer  Past Medical History:  Diagnosis Date   Drug abuse ([place])    Osteomyelitis ([place])     Past Surgical History:  Procedure Laterality Date   BACK SURGERY     INCISION AND DRAINAGE FOREARM / WRIST DEEP Right    LUMBAR LAMINECTOMY FOR EPIDURAL ABSCESS Left 08/16/2021   Procedure: LUMBAR LAMINECTOMY FOR EPIDURAL ABSCESS;  Surgeon: Meade Maw, MD;  Location: [facility] ORS;  Service: Neurosurgery;  Laterality: Left;    Social History   Socioeconomic History   Marital status: Single    Spouse name: Not on file   Number of children: Not on file   Years of education: Not on file   Highest education level: Not on file  Occupational History   Not on file  Tobacco Use   Smoking status: Every Day    Packs/day: 0.50    Types: Cigarettes   Smokeless tobacco: Never  Vaping Use   Vaping Use: Former  Substance and Sexual Activity   Alcohol use: No   Drug use: Not Currently    Types: Methamphetamines, IV    Comment: heroin   Sexual activity: Never  Other Topics Concern   Not on file  Social History Narrative   Not on file   Social Determinants of Health   Financial Resource Strain: Not on file  Food Insecurity: Not on file  Transportation Needs: Not on file  Physical Activity: Not on file  Stress: Not on file  Social Connections: Not on file  Intimate Partner Violence: Not on file    Family History  Problem Relation Age of Onset   Cancer Mother        cancer throughout family   No Known Allergies I? Current Outpatient Medications  Medication Sig Dispense Refill   linezolid (ZYVOX) 600 MG tablet Take 1 tablet by mouth twice a day for 14 days 28 tablet 0  No current facility-administered medications for this visit.     Abtx:  Anti-infectives (From admission, onward)    None       REVIEW OF SYSTEMS:  Const: negative fever, negative chills, negative weight loss Eyes: negative diplopia or visual changes, negative eye pain ENT: negative coryza, negative sore throat Resp: negative cough, hemoptysis, dyspnea Cards: negative for chest pain, palpitations, lower extremity edema GU: negative for frequency, dysuria and hematuria GI: Negative for abdominal pain, diarrhea, bleeding, constipation Skin: negative for rash and pruritus Heme: negative for easy bruising and gum/nose bleeding MS: as  above Neurolo:negative for headaches, dizziness, vertigo, memory problems  Psych: anxiety, depression  Endocrine: negative for thyroid, diabetes Allergy/Immunology- negative for any medication or food allergies ?  Objective:  VITALS:  BP (!) 143/91   Pulse 94   Temp 98.3 F (36.8 C) (Temporal)   Ht _0  (1.702 m)   Wt 156 lb (70.8 kg)   BMI 24.43 kg/m   PHYSICAL EXAM:  General: Alert, cooperative, no distress, appears stated age. pale Head: Normocephalic, without obvious abnormality, atraumatic. Eyes: Conjunctivae clear, anicteric sclerae. Pupils are equal ENT Nares normal. No drainage or sinus tenderness. Lips, mucosa, and tongue normal. No Thrush Neck: Supple, symmetrical, no adenopathy, thyroid: non tender no carotid bruit and no JVD. Back:surgical site- lumbar area has healed completely- scar healthy Lungs: Clear to auscultation bilaterally. No Wheezing or Rhonchi. No rales. Heart: Regular rate and rhythm, no murmur, rub or gallop. Abdomen: Soft, non-tender,not distended. Bowel sounds normal. No masses Extremities: edema legs Rt > left Skin: No rashes or lesions. Or bruising Lymph: Cervical, supraclavicular normal. Neurologic: Grossly non-focal  ? Impression/Recommendation MRSA bacteremia and MRSA lumbar vertebral infection S/p L5-s1 hemilaminectomy on 7/21 PT left AMA after 3 weeks in hospital . During her stay she was on IV antibiotics and after she has been on linezolid  C/o severe edema legs- will r/o DVT- Ultraosund  Worsening back pain- will do labs ( CBC/CMP.ESR.CRP) Will get MRI? Will continue linezolid depending on the lab results ? ___________________________________________________ Discussed with patient, and neurosurgeon ( he will review MRI when done) Note:  This document was prepared using Dragon voice recognition software and may include unintentional dictation errors.  addendum Made an appt for Venous US legs today-at 4.15 am- left a message for  the patient  MRI lumbar spine - next available appt  on Sunday 8am or 5pm- left a message for patient and asked her to call central scheduling to confirm the appt- she has their number

## 2021-09-12 NOTE — Patient Instructions (Signed)
You are here for leg swelling and back pain- you have been on treatment for lumbar osteomyelitis Will do labs Will get Doppler legs to look for blood clot Will order MRI lumbar spine Follow up with results

## 2021-09-14 ENCOUNTER — Emergency Department: Payer: Self-pay

## 2021-09-14 ENCOUNTER — Inpatient Hospital Stay
Admission: EM | Admit: 2021-09-14 | Discharge: 2021-09-18 | DRG: 552 | Disposition: A | Discharge status: Home / Self Care | Payer: Self-pay | Attending: Internal Medicine | Admitting: Internal Medicine

## 2021-09-14 ENCOUNTER — Other Ambulatory Visit: Payer: Self-pay

## 2021-09-14 DIAGNOSIS — F419 Anxiety disorder, unspecified: Secondary | ICD-10-CM | POA: Diagnosis present

## 2021-09-14 DIAGNOSIS — M4646 Discitis, unspecified, lumbar region: Principal | ICD-10-CM | POA: Diagnosis present

## 2021-09-14 DIAGNOSIS — M4627 Osteomyelitis of vertebra, lumbosacral region: Secondary | ICD-10-CM | POA: Diagnosis present

## 2021-09-14 DIAGNOSIS — M4626 Osteomyelitis of vertebra, lumbar region: Secondary | ICD-10-CM | POA: Diagnosis present

## 2021-09-14 DIAGNOSIS — F17290 Nicotine dependence, other tobacco product, uncomplicated: Secondary | ICD-10-CM | POA: Diagnosis present

## 2021-09-14 DIAGNOSIS — F1920 Other psychoactive substance dependence, uncomplicated: Secondary | ICD-10-CM | POA: Diagnosis present

## 2021-09-14 DIAGNOSIS — Z720 Tobacco use: Secondary | ICD-10-CM | POA: Diagnosis present

## 2021-09-14 DIAGNOSIS — B1920 Unspecified viral hepatitis C without hepatic coma: Secondary | ICD-10-CM | POA: Diagnosis present

## 2021-09-14 DIAGNOSIS — Z809 Family history of malignant neoplasm, unspecified: Secondary | ICD-10-CM

## 2021-09-14 DIAGNOSIS — D638 Anemia in other chronic diseases classified elsewhere: Secondary | ICD-10-CM | POA: Diagnosis present

## 2021-09-14 DIAGNOSIS — D649 Anemia, unspecified: Secondary | ICD-10-CM | POA: Diagnosis present

## 2021-09-14 DIAGNOSIS — F1520 Other stimulant dependence, uncomplicated: Secondary | ICD-10-CM | POA: Diagnosis present

## 2021-09-14 DIAGNOSIS — R768 Other specified abnormal immunological findings in serum: Secondary | ICD-10-CM | POA: Diagnosis present

## 2021-09-14 DIAGNOSIS — M4640 Discitis, unspecified, site unspecified: Secondary | ICD-10-CM | POA: Diagnosis present

## 2021-09-14 DIAGNOSIS — Z981 Arthrodesis status: Secondary | ICD-10-CM

## 2021-09-14 DIAGNOSIS — D72819 Decreased white blood cell count, unspecified: Secondary | ICD-10-CM | POA: Diagnosis present

## 2021-09-14 DIAGNOSIS — R600 Localized edema: Principal | ICD-10-CM

## 2021-09-14 DIAGNOSIS — M545 Low back pain, unspecified: Secondary | ICD-10-CM

## 2021-09-14 DIAGNOSIS — F1420 Cocaine dependence, uncomplicated: Secondary | ICD-10-CM | POA: Diagnosis present

## 2021-09-14 DIAGNOSIS — F1120 Opioid dependence, uncomplicated: Secondary | ICD-10-CM | POA: Diagnosis present

## 2021-09-14 LAB — BASIC METABOLIC PANEL
Anion gap: 5 (ref 5–15)
BUN: 13 mg/dL (ref 6–20)
CO2: 29 mmol/L (ref 22–32)
Calcium: 8.9 mg/dL (ref 8.9–10.3)
Chloride: 105 mmol/L (ref 98–111)
Creatinine, Ser: 0.76 mg/dL (ref 0.44–1.00)
GFR, Estimated: >60 mL/min (ref >60)
Glucose, Bld: 102 mg/dL — ABNORMAL HIGH (ref 70–99)
Potassium: 3.8 mmol/L (ref 3.5–5.1)
Sodium: 139 mmol/L (ref 135–145)

## 2021-09-14 LAB — CBC WITH DIFFERENTIAL/PLATELET
Abs Immature Granulocytes: 0.01 10*3/uL (ref 0.00–0.07)
Basophils Absolute: 0 10*3/uL (ref 0.0–0.1)
Basophils Relative: 1 %
Eosinophils Absolute: 0.1 10*3/uL (ref 0.0–0.5)
Eosinophils Relative: 2 %
HCT: 30.7 % — ABNORMAL LOW (ref 36.0–46.0)
Hemoglobin: 9.6 g/dL — ABNORMAL LOW (ref 12.0–15.0)
Immature Granulocytes: 0 %
Lymphocytes Relative: 38 %
Lymphs Abs: 1.7 10*3/uL (ref 0.7–4.0)
MCH: 26 pg (ref 26.0–34.0)
MCHC: 31.3 g/dL (ref 30.0–36.0)
MCV: 83.2 fL (ref 80.0–100.0)
Monocytes Absolute: 0.3 10*3/uL (ref 0.1–1.0)
Monocytes Relative: 7 %
Neutro Abs: 2.3 10*3/uL (ref 1.7–7.7)
Neutrophils Relative %: 52 %
Platelets: 166 10*3/uL (ref 150–400)
RBC: 3.69 MIL/uL — ABNORMAL LOW (ref 3.87–5.11)
RDW: 15.3 % (ref 11.5–15.5)
WBC: 4.5 10*3/uL (ref 4.0–10.5)
nRBC: 0 % (ref 0.0–0.2)

## 2021-09-14 LAB — POC URINE PREG, ED: Preg Test, Ur: NEGATIVE

## 2021-09-14 MED ORDER — GADOBUTROL 1 MMOL/ML IV SOLN
7.0000 mL | Freq: Once | INTRAVENOUS | Status: AC | PRN
  Administered 2021-09-14: 7 mL via INTRAVENOUS

## 2021-09-14 MED ORDER — LACTATED RINGERS IV SOLN: INTRAVENOUS | Status: AC

## 2021-09-14 NOTE — ED Provider Notes (Signed)
[facility] Provider Note    Event Date/Time   First MD Initiated Contact with Patient 09/14/21 2005     (approximate)   History   Post-op Problem   HPI  Susan Terry is a 46 y.o. female extensive past medical history including recent prolonged admission to the hospital for epidural abscess requiring operative management and prolonged IV antibiotics.  She just completed her course of linezolid.  Over the past few days has had worsening swelling in her legs and worsening low back pain.  Denies any fevers no nausea or vomiting.  She had been seen by infectious disease and was recommended to have lower extremity duplex studies to evaluate for DVT as well as MRI with or without to further evaluate.     Physical Exam   Triage Vital Signs: ED Triage Vitals  Enc Vitals Group     BP 09/14/21 1935 132/85     Pulse Rate 09/14/21 1935 71     Resp 09/14/21 1935 16     Temp 09/14/21 1935 98 F (36.7 C)     Temp Source 09/14/21 1935 Oral     SpO2 09/14/21 1935 99 %     Weight 09/14/21 1935 150 lb (68 kg)     Height 09/14/21 1935 5\' 7"  (1.702 m)     Head Circumference --      Peak Flow --      Pain Score 09/14/21 1943 8     Pain Loc --      Pain Edu? --      Excl. in GC? --     Most recent vital signs: Vitals:   09/14/21 1935 09/14/21 2130  BP: 132/85 133/83  Pulse: 71 (!) 54  Resp: 16 17  Temp: 98 F (36.7 C)   SpO2: 99% 100%     Constitutional: Alert  Eyes: Conjunctivae are normal.  Head: Atraumatic. Nose: No congestion/rhinnorhea. Mouth/Throat: Mucous membranes are moist.   Neck: Painless ROM.  Cardiovascular:   Good peripheral circulation. Respiratory: Normal respiratory effort.  No retractions.  Gastrointestinal: Soft and nontender.  Musculoskeletal:  no deformity,  2+ BLE Neurologic:  MAE spontaneously. No gross focal neurologic deficits are appreciated.  Skin:  Skin is warm, dry and intact. No rash noted. Psychiatric: Mood and  affect are normal. Speech and behavior are normal.    ED Results / Procedures / Treatments   Labs (all labs ordered are listed, but only abnormal results are displayed) Labs Reviewed  CBC WITH DIFFERENTIAL/PLATELET - Abnormal; Notable for the following components:      Result Value   RBC 3.69 (*)    Hemoglobin 9.6 (*)    HCT 30.7 (*)    All other components within normal limits  BASIC METABOLIC PANEL - Abnormal; Notable for the following components:   Glucose, Bld 102 (*)    All other components within normal limits  CULTURE, BLOOD (ROUTINE X 2)  CULTURE, BLOOD (ROUTINE X 2)  POC URINE PREG, ED     EKG     RADIOLOGY Please see ED Course for my review and interpretation.  I personally reviewed all radiographic images ordered to evaluate for the above acute complaints and reviewed radiology reports and findings.  These findings were personally discussed with the patient.  Please see medical record for radiology report.    PROCEDURES:  Critical Care performed: No  Procedures   MEDICATIONS ORDERED IN ED: Medications  lactated ringers infusion (has no administration in time range)  gadobutrol (GADAVIST) 1 MMOL/ML injection 7 mL (7 mLs Intravenous Contrast Given 09/14/21 2253)     IMPRESSION / MDM / ASSESSMENT AND PLAN / ED COURSE  I reviewed the triage vital signs and the nursing notes.                              Differential diagnosis includes, but is not limited to, DVT, discitis, epidural abscess, sepsis, musculoskeletal strain, fracture, contusion, CHF  Presents to the ER for evaluation of symptoms as described above.  She is nontoxic-appearing but given her past medical history this presenting complaint could reflect a potentially life-threatening illness therefore the patient will be placed on continuous pulse oximetry and telemetry for monitoring.  Laboratory evaluation will be sent to evaluate for the above complaints.      Clinical Course as of  09/14/21 2332  Sat Sep 14, 2021  2201 Ultrasound without evidence of DVT.  No leukocytosis.  Her renal function normal. [PR]  2245 X-ray on my review and interpretation does not show any evidence of consolidation or edema. [PR]  2245 Work-up thus far is reassuring still awaiting MRI.  Patient will be signed out to oncoming physician pending follow-up results of MRI. [PR]  2331 MRI shows evidence of discitis and osteomyelitis.  Given her sudden onset of worsening pain will treat with IV antibiotics given her complicated past admission with MRSA epidural abscess and discitis.  Have consulted hospitalist for admission. [PR]    Clinical Course User Index [PR] Willy Eddy, MD     FINAL CLINICAL IMPRESSION(S) / ED DIAGNOSES   Final diagnoses:  Leg edema  Midline low back pain without sciatica, unspecified chronicity  Discitis of lumbar region     Rx / DC Orders   ED Discharge Orders     None        Note:  This document was prepared using Dragon voice recognition software and may include unintentional dictation errors.    Willy Eddy, MD 09/14/21 [phone]

## 2021-09-14 NOTE — ED Triage Notes (Signed)
Pt had a procedure to remove fluid off her spine. Fluid cultures came back positive and patient finished all of abx. Pt pain has increased which brought her here tonight. Pt saw infectious disease and was scheduled for a MRI and bilateral US for leg swelling. Pt was told to come here if it worsened over the weekend

## 2021-09-15 DIAGNOSIS — R768 Other specified abnormal immunological findings in serum: Secondary | ICD-10-CM | POA: Diagnosis present

## 2021-09-15 DIAGNOSIS — D649 Anemia, unspecified: Secondary | ICD-10-CM | POA: Diagnosis present

## 2021-09-15 LAB — URINE DRUG SCREEN, QUALITATIVE (ARMC ONLY)
Amphetamines, Ur Screen: NOT DETECTED
Barbiturates, Ur Screen: NOT DETECTED
Benzodiazepine, Ur Scrn: NOT DETECTED
Cannabinoid 50 Ng, Ur ~~LOC~~: NOT DETECTED
Cocaine Metabolite,Ur ~~LOC~~: POSITIVE — AB
MDMA (Ecstasy)Ur Screen: NOT DETECTED
Methadone Scn, Ur: NOT DETECTED
Opiate, Ur Screen: NOT DETECTED
Phencyclidine (PCP) Ur S: NOT DETECTED
Tricyclic, Ur Screen: NOT DETECTED

## 2021-09-15 LAB — CBC
HCT: 28.7 % — ABNORMAL LOW (ref 36.0–46.0)
Hemoglobin: 8.8 g/dL — ABNORMAL LOW (ref 12.0–15.0)
MCH: 25.4 pg — ABNORMAL LOW (ref 26.0–34.0)
MCHC: 30.7 g/dL (ref 30.0–36.0)
MCV: 82.9 fL (ref 80.0–100.0)
Platelets: 158 10*3/uL (ref 150–400)
RBC: 3.46 MIL/uL — ABNORMAL LOW (ref 3.87–5.11)
RDW: 15.3 % (ref 11.5–15.5)
WBC: 3.8 10*3/uL — ABNORMAL LOW (ref 4.0–10.5)
nRBC: 0 % (ref 0.0–0.2)

## 2021-09-15 LAB — IRON AND TIBC
Iron: 36 ug/dL (ref 28–170)
Saturation Ratios: 11 % (ref 10.4–31.8)
TIBC: 319 ug/dL (ref 250–450)
UIBC: 283 ug/dL

## 2021-09-15 LAB — CREATININE, SERUM
Creatinine, Ser: 0.67 mg/dL (ref 0.44–1.00)
GFR, Estimated: >60 mL/min (ref >60)

## 2021-09-15 LAB — RETICULOCYTES
Immature Retic Fract: 9.2 % (ref 2.3–15.9)
RBC.: 3.69 MIL/uL — ABNORMAL LOW (ref 3.87–5.11)
Retic Count, Absolute: 66.1 10*3/uL (ref 19.0–186.0)
Retic Ct Pct: 1.8 % (ref 0.4–3.1)

## 2021-09-15 LAB — FOLATE: Folate: 7.4 ng/mL (ref >5.9)

## 2021-09-15 LAB — FERRITIN: Ferritin: 26 ng/mL (ref 11–307)

## 2021-09-15 MED ORDER — ENOXAPARIN SODIUM 40 MG/0.4ML IJ SOSY
40.0000 mg | PREFILLED_SYRINGE | INTRAMUSCULAR | Status: DC
Start: 2021-09-15 — End: 2021-09-18
  Administered 2021-09-15: 40 mg via SUBCUTANEOUS
  Filled 2021-09-15 (×3): qty 0.4

## 2021-09-15 MED ORDER — ONDANSETRON HCL 4 MG PO TABS: 4.0000 mg | ORAL_TABLET | Freq: Four times a day (QID) | ORAL | Status: DC | PRN

## 2021-09-15 MED ORDER — ACETAMINOPHEN 325 MG PO TABS
650.0000 mg | ORAL_TABLET | Freq: Four times a day (QID) | ORAL | Status: DC | PRN
  Administered 2021-09-15: 650 mg via ORAL
  Filled 2021-09-15: qty 2

## 2021-09-15 MED ORDER — NICOTINE 21 MG/24HR TD PT24
21.0000 mg | MEDICATED_PATCH | Freq: Every day | TRANSDERMAL | Status: DC
  Filled 2021-09-15 (×3): qty 1

## 2021-09-15 MED ORDER — OXYCODONE HCL 5 MG PO TABS
5.0000 mg | ORAL_TABLET | ORAL | Status: DC | PRN
  Administered 2021-09-15 – 2021-09-18 (×5): 5 mg via ORAL
  Filled 2021-09-15 (×5): qty 1

## 2021-09-15 MED ORDER — LORAZEPAM 1 MG PO TABS
1.0000 mg | ORAL_TABLET | Freq: Two times a day (BID) | ORAL | Status: DC
Start: 2021-09-15 — End: 2021-09-18
  Administered 2021-09-15 – 2021-09-18 (×6): 1 mg via ORAL
  Filled 2021-09-15 (×7): qty 1

## 2021-09-15 MED ORDER — VANCOMYCIN HCL 1500 MG/300ML IV SOLN
1500.0000 mg | Freq: Once | INTRAVENOUS | Status: AC
  Administered 2021-09-15: 1500 mg via INTRAVENOUS
  Filled 2021-09-15: qty 300

## 2021-09-15 MED ORDER — VANCOMYCIN HCL IN DEXTROSE 1-5 GM/200ML-% IV SOLN
1000.0000 mg | Freq: Two times a day (BID) | INTRAVENOUS | Status: DC
  Administered 2021-09-15 – 2021-09-18 (×6): 1000 mg via INTRAVENOUS
  Filled 2021-09-15 (×8): qty 200

## 2021-09-15 MED ORDER — ONDANSETRON HCL 4 MG/2ML IJ SOLN: 4.0000 mg | Freq: Four times a day (QID) | INTRAMUSCULAR | Status: DC | PRN

## 2021-09-15 MED ORDER — HYDROMORPHONE HCL 1 MG/ML IJ SOLN
1.0000 mg | INTRAMUSCULAR | Status: DC | PRN
  Administered 2021-09-15 – 2021-09-18 (×11): 1 mg via INTRAVENOUS
  Filled 2021-09-15 (×11): qty 1

## 2021-09-15 MED ORDER — HYDROMORPHONE HCL 1 MG/ML IJ SOLN
0.5000 mg | INTRAMUSCULAR | Status: DC | PRN
  Administered 2021-09-15: 0.5 mg via INTRAVENOUS
  Filled 2021-09-15: qty 1

## 2021-09-15 MED ORDER — HYDROMORPHONE HCL 1 MG/ML IJ SOLN: 1.0000 mg | INTRAMUSCULAR | Status: DC | PRN

## 2021-09-15 MED ORDER — KETOROLAC TROMETHAMINE 30 MG/ML IJ SOLN
30.0000 mg | Freq: Four times a day (QID) | INTRAMUSCULAR | Status: DC | PRN
  Administered 2021-09-16 – 2021-09-17 (×2): 30 mg via INTRAVENOUS
  Filled 2021-09-15 (×2): qty 1

## 2021-09-15 MED ORDER — ACETAMINOPHEN 650 MG RE SUPP: 650.0000 mg | Freq: Four times a day (QID) | RECTAL | Status: DC | PRN

## 2021-09-15 NOTE — Assessment & Plan Note (Deleted)
-   Recent hospitalization was concerning for substance use paraphernalia in the room - Increased nursing vigilance.  Patient counseled

## 2021-09-15 NOTE — ED Notes (Signed)
MRI technician reports after IV catheter placement by IV team staff member, pt ran to bathroom and then back out of bathroom, grabbed purse, and then ran back into bathroom. Door was locked and pt would not open door for approximately 8 minutes.

## 2021-09-15 NOTE — Progress Notes (Signed)
Pharmacy Antibiotic Note  Susan Terry is a 46 y.o. female admitted on 09/14/2021 with  discitis .  Pharmacy has been consulted for Vancomycin dosing.  Plan: Vancomycin 1500 mg IV X 1 given in ED on 8/20 @ 0040. Vancomycin 1 gm IV Q12H ordered to start on 8/20 @ 1300.  AUC = 526.5 Vanc trough = 14.4   Height: 5\' 7"  (170.2 cm) Weight: 68 kg (150 lb) IBW/kg (Calculated) : 61.6  Temp (24hrs), Avg:98 F (36.7 C), Min:98 F (36.7 C), Max:98 F (36.7 C)  Recent Labs  Lab 09/12/21 1255 09/14/21 1936 09/15/21 0041  WBC 5.6 4.5 3.8*  CREATININE 0.84 0.76 0.67    Estimated Creatinine Clearance: 88.2 mL/min (by C-G formula based on SCr of 0.67 mg/dL).    No Known Allergies  Antimicrobials this admission:   >>    >>   Dose adjustments this admission:   Microbiology results:  BCx:   UCx:    Sputum:    MRSA PCR:   Thank you for allowing pharmacy to be a part of this patient's care.  Johm Pfannenstiel D 09/15/2021 1:23 AM

## 2021-09-15 NOTE — ED Notes (Addendum)
Pts demeanor has changed since arrival to treatment room.

## 2021-09-15 NOTE — Progress Notes (Signed)
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  Initial plan had been to treat patient with double MRSA coverage with ceftaroline and daptomycin and plans to continue daptomycin until 8/25, but then patient signed out AMA on 7/31 with a prescription for Zyvox.  Patient had seen infectious disease, Dr. Rivka Safer on 8/17 with complaints of lower extremity swelling and worsening back pain and had labs done at that time.  Patient was advised to go to the emergency room and so she presented on the evening of 8/19.  MRI revealed discitis/osteomyelitis from areas L4-S1 similar to MRI seen 7/18.  Patient had blood cultures done and started on IV vancomycin.  Infectious disease consulted.  Assessment and Plan: Assessment and Plan: * Lumbar discitis, recurrent Status post lumbosacral hemilaminectomy on 7/21 for MRSA bacteremia.  At that time, was supposed to receive double MRSA coverage with ceftaroline and daptomycin from 7/21 until 8/25, but left AMA on 7/31 with prescription for Zyvox.  Patient states she did take the entire course.  Infectious disease to follow.  For now, isolation precautions and IV vancomycin per pharmacy.  Lumbar MRI on admission notes no improvement or worsening compared to previous MRI right before surgery.  Hepatitis C antibody positive in blood Unclear if this was previously undiagnosed.  Patient  had a normal hepatitis C antibody 4 years ago.  Infectious disease will follow.  We will check quantitative viral count  Polysubstance dependence including opioid type drug, continuous use (HCC) Urine drug screen positive for cocaine.  Patient has abused IV heroin in the past.  Monitor closely.  I explained the patient that we will give her something for her pain, but we would not be able to get her pain under control given her addictions.  She understands.  Also so that we give her medication for her nerves which would help, which she was amenable to.   Tobacco abuse We will provide nicotine patch  Anemia Unclear etiology.  Likely some of this is from chronic disease given her osteomyelitis.  She normally runs between 11 and 12 although in July was around 10-11.  She presented with a hemoglobin of 9.8 on admission but down to 8.8 today.       Body mass index is 23.49 kg/m.        Consultants: Infectious disease  Procedures: None  Antimicrobials: IV vancomycin 8/20-present  Code Status: Full code   Subjective: Patient complains of lower back pain  Objective: Vital signs were reviewed and unremarkable. Vitals:   09/15/21 0952 09/15/21 1016  BP: (!) 104/55 125/74  Pulse: 77 66  Resp: 14 16  Temp: 98.9 F (37.2 C) 98.3 F (36.8 C)  SpO2: 99% 99%    Intake/Output Summary (Last 24 hours) at 09/15/2021 1451 Last data filed at 09/15/2021 0753 Gross per 24 hour  Intake 1000 ml  Output --  Net 1000 ml   Filed Weights   09/14/21 1935  Weight: 68 kg   Body mass index is 23.49 kg/m.  Exam:  General: Alert and oriented x3, mild distress HEENT: Normocephalic, atraumatic, mucous membranes are moist Cardiovascular: Regular rate and rhythm, S1-S2 Respiratory: Clear to auscultation bilaterally Abdomen: Soft, nontender, nondistended, positive bowel sounds Musculoskeletal: No clubbing or cyanosis or edema Skin: Patient has multiple scars and signs of previous skin  abscesses Psychiatry: Appropriate, no evidence of psychoses Neurology: No focal deficits  Data Reviewed: Positive antibody for HCV Hemoglobin of 8.8 with MCV of 83  Disposition:  Status is: Inpatient Remains inpatient appropriate because:  -Stabilization of infection -Evaluation of anemia -Evaluation of hepatitis C    Anticipated discharge date: To be determined, likely here for the next few weeks  Family Communication: Declined for me to call anyone DVT Prophylaxis: enoxaparin (LOVENOX) injection 40 mg Start: 09/15/21 0030    Author: Hollice Espy ,MD 09/15/2021 2:51 PM  To reach On-call, see care teams to locate the attending and reach out via [email or web address]. Between 7PM-7AM, please contact night-coverage If you still have difficulty reaching the attending provider, please page the [facility] (Director on Call) for [facility] on amion for assistance.

## 2021-09-15 NOTE — ED Notes (Signed)
Pt reports taking mothers oxycodone prior to arrival.

## 2021-09-15 NOTE — Assessment & Plan Note (Signed)
Unclear etiology.  Likely some of this is from chronic disease given her osteomyelitis.  She normally runs between 11 and 12 although in July was around 10-11.  She presented with a hemoglobin of 9.8 on admission but down to 8.8 today.

## 2021-09-15 NOTE — Hospital Course (Signed)
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  Initial plan had been to treat patient with double MRSA coverage with ceftaroline and daptomycin and plans to continue daptomycin until 8/25, but then patient signed out AMA on 7/31 with a prescription for Zyvox.  Patient had seen infectious disease, Dr. Rivka Safer on 8/17 with complaints of lower extremity swelling and worsening back pain and had labs done at that time.  Patient was advised to go to the emergency room and so she presented on the evening of 8/19.  MRI revealed discitis/osteomyelitis from areas L4-S1 similar to MRI seen 7/18.  Patient had blood cultures done and started on IV vancomycin.  Infectious disease consulted.

## 2021-09-15 NOTE — ED Notes (Signed)
Answered pts call bell to find pt had unhooked self from monitoring equipment and IV fluids. Pt had paused IV pump. Discussed for the second time that pt should not be unhooking self from IV medication administration pumps due to risk of infection. Discussed situation with April, Consulting civil engineer. If pt continues to put IV catheter sterility at risk, a sitter will be obtained for the pts safety.

## 2021-09-15 NOTE — H&P (Signed)
History and Physical    Patient: Susan Terry HUD:[id number] DOB: 1975/08/25 DOA: 09/14/2021 DOS: the patient was seen and examined on 09/15/2021 PCP: Pcp, No  Patient coming from: Home  Chief Complaint:  Chief Complaint  Patient presents with   Post-op Problem    HPI: Susan Terry is a 46 y.o. 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 Marcell Barlow, treated initially with double MRSA coverage with ceftaroline and daptomycin with plans to continue daptomycin until 8/25 but then signed out AMA on 7/31 with a prescription for linezolid, resents to the ED with bilateral lower extremity swelling and worsening back pain.  Patient saw ID specialist Dr. Rudene Anda on 8/17 with a similar complaint and blood work including cultures were done and she was advised to present to the ED for worsening symptoms which she did.  She denies fever or chills.   ED course and data review: Vitals within normal limits.  Labs with normal WBC.  Hemoglobin of 9.6 which is his baseline.  BMP and CBC otherwise unremarkable Imaging bilateral lower extremity venous Doppler negative for DVT.  Chest x-ray nonacute MRI lumbar spine showing the following: IMPRESSION: 1. Overall unchanged L4-L5 disc edema with surrounding L4 and L5 marrow edema, compatible with discitis osteomyelitis with lesser changes at L5-S1, which also appears similar to 08/13/2021. 2. Status post interval L5-S1 left laminectomy and evacuation of the previously noted epidural collection. No new epidural collection is seen. 3. Redemonstrated mild ventral epidural thickening at L5, which could be venous plexus versus phlegmon, unchanged from  the prior exam. 4. Small fluid collection in the subcutaneous tissues, likely related to recent surgery. This does demonstrate some associated contrast enhancement, but does not appear to approach the thecal sac. 5. Mild multilevel degenerative changes, unchanged compared to prior exams.  Patient started on vancomycin and IV fluids.  Hospitalist consulted for admission.     Past Medical History:  Diagnosis Date   Drug abuse (HCC)    Osteomyelitis (HCC)    Past Surgical History:  Procedure Laterality Date   BACK SURGERY     INCISION AND DRAINAGE FOREARM / WRIST DEEP Right    LUMBAR LAMINECTOMY FOR EPIDURAL ABSCESS Left 08/16/2021   Procedure: LUMBAR LAMINECTOMY FOR EPIDURAL ABSCESS;  Surgeon: Venetia Night, MD;  Location: [facility] ORS;  Service: Neurosurgery;  Laterality: Left;   Social History:  reports that she has quit smoking. Her smoking use included cigarettes. She smoked an average of .5 packs per day. She has never used smokeless tobacco. She reports current drug use. Drugs: Methamphetamines and IV. She reports that she does not drink alcohol.  No Known Allergies  Family History  Problem Relation Age of Onset   Cancer Mother        cancer throughout family    Prior to Admission medications   Medication Sig Start Date End Date Taking? Authorizing Provider  linezolid (ZYVOX) 600 MG tablet Take 1 tablet by mouth twice a day for 14 days 08/28/21   Lynn Ito, MD    Physical Exam: Vitals:   09/14/21 1935 09/14/21 2130  BP: 132/85 133/83  Pulse: 71 (!) 54  Resp: 16 17  Temp: 98 F (36.7 C)   TempSrc: Oral   SpO2: 99% 100%  Weight: 68 kg  Height: 5\' 7"  (1.702 m)    Physical Exam Vitals and nursing note reviewed.  Constitutional:      General: She is not in acute distress. HENT:     Head: Normocephalic and atraumatic.  Cardiovascular:     Rate and Rhythm: Normal rate and regular rhythm.     Heart sounds: Normal heart sounds.  Pulmonary:      Effort: Pulmonary effort is normal.     Breath sounds: Normal breath sounds.  Abdominal:     Palpations: Abdomen is soft.     Tenderness: There is no abdominal tenderness.  Musculoskeletal:     Right lower leg: Edema present.     Left lower leg: Edema present.  Neurological:     Mental Status: Mental status is at baseline.     Labs on Admission: I have personally reviewed following labs and imaging studies  CBC: Recent Labs  Lab 09/12/21 1255 09/14/21 1936  WBC 5.6 4.5  NEUTROABS 3.7 2.3  HGB 9.8* 9.6*  HCT 30.6* 30.7*  MCV 79.9* 83.2  PLT 178 166   Basic Metabolic Panel: Recent Labs  Lab 09/12/21 1255 09/14/21 1936  NA 136 139  K 3.3* 3.8  CL 101 105  CO2 26 29  GLUCOSE 132* 102*  BUN 13 13  CREATININE 0.84 0.76  CALCIUM 9.3 8.9   GFR: Estimated Creatinine Clearance: 88.2 mL/min (by C-G formula based on SCr of 0.76 mg/dL). Liver Function Tests: Recent Labs  Lab 09/12/21 1255  AST 21  ALT 14  ALKPHOS 81  BILITOT 0.3  PROT 8.1  ALBUMIN 3.7   No results for input(s): "LIPASE", "AMYLASE" in the last 168 hours. No results for input(s): "AMMONIA" in the last 168 hours. Coagulation Profile: No results for input(s): "INR", "PROTIME" in the last 168 hours. Cardiac Enzymes: No results for input(s): "CKTOTAL", "CKMB", "CKMBINDEX", "TROPONINI" in the last 168 hours. BNP (last 3 results) No results for input(s): "PROBNP" in the last 8760 hours. HbA1C: No results for input(s): "HGBA1C" in the last 72 hours. CBG: No results for input(s): "GLUCAP" in the last 168 hours. Lipid Profile: No results for input(s): "CHOL", "HDL", "LDLCALC", "TRIG", "CHOLHDL", "LDLDIRECT" in the last 72 hours. Thyroid Function Tests: No results for input(s): "TSH", "T4TOTAL", "FREET4", "T3FREE", "THYROIDAB" in the last 72 hours. Anemia Panel: No results for input(s): "VITAMINB12", "FOLATE", "FERRITIN", "TIBC", "IRON", "RETICCTPCT" in the last 72 hours. Urine analysis:    Component  Value Date/Time   COLORURINE YELLOW (A) 08/06/2021 0420   APPEARANCEUR CLEAR (A) 08/06/2021 0420   LABSPEC 1.013 08/06/2021 0420   PHURINE 6.0 08/06/2021 0420   GLUCOSEU NEGATIVE 08/06/2021 0420   HGBUR NEGATIVE 08/06/2021 0420   BILIRUBINUR NEGATIVE 08/06/2021 0420   KETONESUR 80 (A) 08/06/2021 0420   PROTEINUR NEGATIVE 08/06/2021 0420   NITRITE NEGATIVE 08/06/2021 0420   LEUKOCYTESUR NEGATIVE 08/06/2021 0420    Radiological Exams on Admission: MR Lumbar Spine W Wo Contrast  Result Date: 09/14/2021 CLINICAL DATA:  Recurrent lumbar discitis/osteomyelitis; recent positive cultures, increased pain; L5-S1 laminectomy July 2023 EXAM: MRI LUMBAR SPINE WITHOUT AND WITH CONTRAST TECHNIQUE: Multiplanar and multiecho pulse sequences of the lumbar spine were obtained without and with intravenous contrast. CONTRAST:  32mL GADAVIST GADOBUTROL 1 MMOL/ML IV SOLN COMPARISON:  08/13/2021 FINDINGS: Segmentation:  Standard. Alignment: Mild dextrocurvature. Trace retrolisthesis of L2 on L3, unchanged. Vertebrae: Redemonstrated increased T2 signal and enhancement in the L4-L5 disc space and vertebral bodies, with abnormal signal to a lesser extent in the L5-S1 disc. Minimal  edema at the posterosuperior aspect of S1, unchanged. Prior L2-L3 posterior fusion with sequela of more remote discitis osteomyelitis. Conus medullaris and cauda equina: Conus extends to the L1 level. Conus appears normal. Previously noted peripherally enhancing collection in the left dorsal aspect of the spinal canal at L5-S1 is no longer seen, status post interval left L5-S1 laminectomy. Ventral epidural thickening and enhancement, which could represent venous plexus versus phlegmon, overall unchanged compared to the prior exam. Paraspinal and other soft tissues: Mild paraspinal edema without focal collection in the paraspinous muscles or psoas. Postsurgical changes posterior to L5-S1, with small fluid collection that measures approximately 7 x 5 x  15 mm; this does demonstrate some enhancement (series 12, image 32), although this likely is inflammatory. No connection between this fluid collection and the thecal sac is seen. Disc levels: T12-L1: No significant disc bulge. No spinal canal stenosis or neural foraminal narrowing. L1-L2: No significant disc bulge. No spinal canal stenosis or neural foraminal narrowing. L2-L3: Disc collapse with posterior fusion. No spinal canal stenosis or significant neural foraminal narrowing. L3-L4: Mild disc bulge. Moderate facet arthropathy. No spinal canal stenosis no neural foraminal narrowing. L4-L5: Mild disc bulge. Moderate facet arthropathy. Narrowing of the lateral recesses. No spinal canal stenosis or neural foraminal narrowing. L5-S1: Left laminectomy. Mild disc bulge with left paracentral and subarticular protrusion, which likely compresses the descending left S1 nerve roots. Mild facet arthropathy. No spinal canal stenosis. Moderate bilateral neural foraminal narrowing, unchanged. IMPRESSION: 1. Overall unchanged L4-L5 disc edema with surrounding L4 and L5 marrow edema, compatible with discitis osteomyelitis with lesser changes at L5-S1, which also appears similar to 08/13/2021. 2. Status post interval L5-S1 left laminectomy and evacuation of the previously noted epidural collection. No new epidural collection is seen. 3. Redemonstrated mild ventral epidural thickening at L5, which could be venous plexus versus phlegmon, unchanged from the prior exam. 4. Small fluid collection in the subcutaneous tissues, likely related to recent surgery. This does demonstrate some associated contrast enhancement, but does not appear to approach the thecal sac. 5. Mild multilevel degenerative changes, unchanged compared to prior exams. Electronically Signed   By: Merilyn Baba M.D.   On: 09/14/2021 23:20   DG Chest Portable 1 View  Result Date: 09/14/2021 CLINICAL DATA:  leg swelling, eval for cardiomegal, edema EXAM: PORTABLE  CHEST 1 VIEW COMPARISON:  Chest radiograph 12/09/2020 FINDINGS: The heart is normal in size for technique. The cardiomediastinal contours are normal. The lungs are clear. Pulmonary vasculature is normal. No consolidation, pleural effusion, or pneumothorax. No acute osseous abnormalities are seen. IMPRESSION: No acute chest findings. Normal heart size for technique. No pulmonary edema. Electronically Signed   By: Keith Rake M.D.   On: 09/14/2021 22:11   US Venous Img Lower Bilateral  Result Date: 09/14/2021 CLINICAL DATA:  Leg swelling EXAM: BILATERAL LOWER EXTREMITY VENOUS DOPPLER ULTRASOUND TECHNIQUE: Gray-scale sonography with compression, as well as color and duplex ultrasound, were performed to evaluate the deep venous system(s) from the level of the common femoral vein through the popliteal and proximal calf veins. COMPARISON:  None Available. FINDINGS: VENOUS Normal compressibility of the common femoral, superficial femoral, and popliteal veins, as well as the visualized calf veins. Visualized portions of profunda femoral vein and great saphenous vein unremarkable. No filling defects to suggest DVT on grayscale or color Doppler imaging. Doppler waveforms show normal direction of venous flow, normal respiratory plasticity and response to augmentation. Limited views of the contralateral common femoral vein are unremarkable. OTHER None. Limitations:  none IMPRESSION: Negative. Electronically Signed   By: Merilyn Baba M.D.   On: 09/14/2021 21:22     Data Reviewed: Relevant notes from primary care and specialist visits, past discharge summaries as available in EHR, including Care Everywhere. Prior diagnostic testing as pertinent to current admission diagnoses Updated medications and problem lists for reconciliation ED course, including vitals, labs, imaging, treatment and response to treatment Triage notes, nursing and pharmacy notes and ED provider's notes Notable results as noted in  HPI   Assessment and Plan: * Lumbar discitis, recurrent -S/p L5-S1 hemilaminectomy on 08/16/21 for MRSA bacteremia with discitis treated initially with double MRSA coverage with ceftaroline and daptomycin with plans to continue daptomycin until 8/25 but then signed out [employer] on 7/31 with a prescription for linezolid -Lumbar MRI showing "overall unchanged L4-L5 disc edema with surrounding L4 and L5 marrow edema, compatible with discitis osteomyelitis with lesser changes at L5-S1, which also appears similar to 08/13/2021. -Follow cultures and blood work done from [place] office visit on 8/17.  CRP was 1.0, down from prior of 1.8 with sed rate 47, down from 87 -Vancomycin -ID consult in the a.m. -Pain control   Polysubstance dependence including opioid type drug, continuous use (HCC) -History of treatment with methadone and suboxone - Recent past hospitalization on 7/21 patient was found vaping and with other paraphernalia in the room - Increased nursing vigilance.  Patient counseled         DVT prophylaxis: Lovenox  Consults: none  Advance Care Planning:   Code Status: Prior   Family Communication: none  Disposition Plan: Back to previous home environment  Severity of Illness: The appropriate patient status for this patient is INPATIENT. Inpatient status is judged to be reasonable and necessary in order to provide the required intensity of service to ensure the patient's safety. The patient's presenting symptoms, physical exam findings, and initial radiographic and laboratory data in the context of their chronic comorbidities is felt to place them at high risk for further clinical deterioration. Furthermore, it is not anticipated that the patient will be medically stable for discharge from the hospital within 2 midnights of admission.   * I certify that at the point of admission it is my clinical judgment that the patient will require inpatient hospital care spanning beyond 2 midnights  from the point of admission due to high intensity of service, high risk for further deterioration and high frequency of surveillance required.*  Author: Athena Masse, MD 09/15/2021 12:07 AM  For on call review [email or web address].

## 2021-09-15 NOTE — ED Notes (Signed)
Patient eating breakfast tray at this time. 

## 2021-09-15 NOTE — Assessment & Plan Note (Signed)
Unclear if this was previously undiagnosed.  Patient had a normal hepatitis C antibody 4 years ago.  Infectious disease will follow. HCV RNA ordered.

## 2021-09-15 NOTE — Progress Notes (Signed)
PHARMACY -  BRIEF ANTIBIOTIC NOTE   Pharmacy has received consult(s) for Vancomycin from an ED provider.  The patient's profile has been reviewed for ht/wt/allergies/indication/available labs.    One time order(s) placed for Vancomycin 1500 mg IV X 1.   Further antibiotics/pharmacy consults should be ordered by admitting physician if indicated.                       Thank you, Melat Wrisley D 09/15/2021  12:03 AM

## 2021-09-15 NOTE — Assessment & Plan Note (Addendum)
Urine drug screen positive for cocaine.  Patient has abused IV heroin in the past.  Monitor closely.  I explained the patient that we will give her something for her pain, but we would not be able to get her pain under control given her addictions.  She understands.  Also so that we give her medication for her nerves which would help, which she was amenable to.

## 2021-09-15 NOTE — Assessment & Plan Note (Addendum)
Status post lumbosacral hemilaminectomy on 7/21 for MRSA bacteremia.  At that time, was supposed to receive double MRSA coverage with ceftaroline and daptomycin from 7/21 until 8/25, but left AMA on 7/31 with prescription for Zyvox.  Patient states she did take the entire course.  Infectious disease to follow.  For now, isolation precautions and IV vancomycin per pharmacy.  Lumbar MRI on admission notes no improvement or worsening compared to previous MRI right before surgery.  Infectious disease to see.  Have asked neurosurgery to weigh in as well.

## 2021-09-15 NOTE — ED Notes (Signed)
Advised nurse that patient has ready bed 

## 2021-09-15 NOTE — Assessment & Plan Note (Signed)
We will provide nicotine patch

## 2021-09-16 ENCOUNTER — Encounter: Payer: Self-pay | Admitting: Internal Medicine

## 2021-09-16 DIAGNOSIS — M4646 Discitis, unspecified, lumbar region: Secondary | ICD-10-CM

## 2021-09-16 DIAGNOSIS — F1920 Other psychoactive substance dependence, uncomplicated: Secondary | ICD-10-CM

## 2021-09-16 DIAGNOSIS — D649 Anemia, unspecified: Secondary | ICD-10-CM

## 2021-09-16 DIAGNOSIS — R600 Localized edema: Secondary | ICD-10-CM

## 2021-09-16 DIAGNOSIS — F1120 Opioid dependence, uncomplicated: Secondary | ICD-10-CM

## 2021-09-16 DIAGNOSIS — R768 Other specified abnormal immunological findings in serum: Secondary | ICD-10-CM

## 2021-09-16 LAB — CREATININE, SERUM
Creatinine, Ser: 0.85 mg/dL (ref 0.44–1.00)
GFR, Estimated: >60 mL/min (ref >60)

## 2021-09-16 LAB — VITAMIN B12: Vitamin B-12: 613 pg/mL (ref 180–914)

## 2021-09-16 LAB — MRSA NEXT GEN BY PCR, NASAL: MRSA by PCR Next Gen: NOT DETECTED

## 2021-09-16 NOTE — Consult Note (Addendum)
NAME: Susan Terry  DOB: [birth year]  MRN: [id number]  Date/Time: 09/16/2021 12:23 PM  REQUESTING PROVIDER: Dr.Sendhil Subjective:  REASON FOR CONSULT: recent MRSA vertebral osteo ? Susan Terry is a 46 y.o. 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 between 7/10-7/31 and left AMA on 08/26/21 For MRSA bacteremia and  lumbar L4-L5 - L5-S1 infection for which she underwent L5-S1 hemi-lamencetomy on 08/16/21 Initially she had to get double MRSA coverage with ceftaroline + Dapto for 12 days and then dapto alone. The plan was to give for 6 weeks until 09/20/21. Pt wantted to leave initially on 09/05/21 and then take PO to complete course- She left AMA on 7/31 after she was found vaping in the room and syringes in her bag Post discharge She has ben taking linezolid regularly. I saw her on 09/12/21  because of swelling both legs and worsening back pain Says some movements made it worse , her leg swelling got worse and back started hurting Taking naprosyn every day No fever or chills   On admission  09/14/21  BP 133/83  Temp 98 F (36.7 C)  Pulse Rate 54 !  Resp 17  SpO2 100 %    Latest Reference Range & Units 09/14/21  WBC 4.0 - 10.5 K/uL 4.5  Hemoglobin 12.0 - 15.0 g/dL 9.6 (L)  HCT 36.0 - 46.0 % 30.7 (L)  Platelets 150 - 400 K/uL 166  Creatinine 0.44 - 1.00 mg/dL 0.76   MRI lumbar spine from today unchanged L4-L5 disc edema with surrounding L4 and  L5 marrow edema, compatible with discitis osteomyelitis with lesser changes at L5-S1, which also appears similar to 08/13/2021. 2. Status post interval L5-S1 left laminectomy and evacuation of the previously noted epidural collection. No new epidural collection is seen.  ESR done on 8/17  is 49 ( was 89 on 08/24/21)  Past Medical History:  Diagnosis Date   Drug abuse ([place])    Osteomyelitis ([place])     Past Surgical History:  Procedure Laterality Date   BACK SURGERY     INCISION AND DRAINAGE FOREARM / WRIST DEEP Right    LUMBAR LAMINECTOMY FOR EPIDURAL ABSCESS Left 08/16/2021   Procedure: LUMBAR LAMINECTOMY FOR EPIDURAL ABSCESS;  Surgeon: Meade Maw, MD;  Location: [facility] ORS;  Service: Neurosurgery;  Laterality: Left;    Social History   Socioeconomic History   Marital status: Single    Spouse name: Not on file   Number of children: Not on file   Years of education: Not on file   Highest education level: Not on file  Occupational History   Not on file  Tobacco Use   Smoking status: Former    Packs/day: 0.50    Types: Cigarettes   Smokeless tobacco: Never  Vaping Use   Vaping Use: Every day   Substances: Nicotine  Substance and Sexual Activity   Alcohol use: No   Drug use: Yes    Types: Methamphetamines, IV    Comment: heroin   Sexual activity: Never  Other Topics Concern   Not on file  Social History Narrative   Not on file   Social Determinants of Health   Financial Resource Strain: Not on file  Food Insecurity: Not on file  Transportation Needs: Not on file  Physical Activity: Not on file  Stress: Not on file  Social Connections: Not on file  Intimate Partner Violence: Not on file    Family History  Problem Relation Age of Onset   Cancer Mother        cancer throughout family   No Known Allergies I? Current Facility-Administered Medications  Medication Dose Route Frequency Provider Last Rate Last Admin   acetaminophen (TYLENOL) tablet 650 mg   650 mg Oral Q6H PRN Athena Masse, MD   650 mg at 09/15/21 0128   Or   acetaminophen (TYLENOL) suppository 650 mg  650 mg Rectal Q6H PRN Athena Masse, MD       enoxaparin (LOVENOX) injection 40 mg  40 mg Subcutaneous Q24H Judd Gaudier V, MD   40 mg at 09/15/21 0127   HYDROmorphone (DILAUDID) injection 1 mg  1 mg Intravenous Q3H PRN Annita Brod, MD   1 mg at 09/16/21 0425   ketorolac (TORADOL) 30 MG/ML injection 30 mg  30 mg Intravenous Q6H PRN Athena Masse, MD       LORazepam (ATIVAN) tablet 1 mg  1 mg Oral BID Annita Brod, MD   1 mg at 09/16/21 0950   nicotine (NICODERM CQ - dosed in mg/24 hours) patch 21 mg  21 mg Transdermal Daily Annita Brod, MD       ondansetron [facility]) tablet 4 mg  4 mg Oral Q6H PRN Athena Masse, MD       Or   ondansetron [facility]) injection 4 mg  4 mg Intravenous Q6H PRN Athena Masse, MD       oxyCODONE (Oxy IR/ROXICODONE) immediate release tablet 5 mg  5 mg Oral Q4H PRN Athena Masse, MD   5 mg at 09/16/21 0950   vancomycin (VANCOCIN) IVPB 1000 mg/200 mL premix  1,000 mg Intravenous Q12H Athena Masse, MD   Stopped at 09/16/21 0220     Abtx:  Anti-infectives (From admission, onward)    Start     Dose/Rate Route Frequency Ordered Stop   09/15/21 1300  vancomycin (VANCOCIN) IVPB 1000 mg/200 mL premix        1,000 mg 200 mL/hr over 60 Minutes Intravenous Every 12 hours 09/15/21 0123     09/15/21 0015  vancomycin (VANCOREADY) IVPB 1500 mg/300 mL        1,500 mg 150 mL/hr over 120 Minutes Intravenous  Once 09/15/21 0002 09/15/21 0247       REVIEW OF SYSTEMS:  Const: negative fever, negative chills, negative weight loss Eyes: negative diplopia or visual changes, negative eye pain ENT: negative coryza, negative sore throat Resp: negative cough, hemoptysis, dyspnea Cards: negative for chest pain, palpitations, lower extremity edema GU: negative for frequency, dysuria and hematuria GI: Negative for abdominal pain, diarrhea,  bleeding, constipation Skin: negative for rash and pruritus Heme: negative for easy bruising and gum/nose bleeding MS:  back pain  Neurolo:negative for headaches, dizziness, vertigo, memory problems  Psych:  anxiety, depression  Endocrine: negative for thyroid, diabetes Allergy/Immunology- negative for any medication or food allergies  Objective:  VITALS:  BP (!) 157/93 (BP Location: Left Arm)   Pulse 89   Temp 98.4 F (36.9 C) (Oral)   Resp 19  Ht _0  (1.702 m)   Wt 68 kg   LMP  (LMP Unknown)   SpO2 100%   BMI 23.49 kg/m   PHYSICAL EXAM:  General: Alert, cooperative, restless, .  Head: Normocephalic, without obvious abnormality, atraumatic. Eyes: Conjunctivae clear, anicteric sclerae. Pupils are equal ENT Nares normal. No drainage or sinus tenderness. Lips, mucosa, and tongue normal. No Thrush Neck: Supple, symmetrical, no adenopathy, thyroid: non tender no carotid bruit and no JVD. Back: No CVA tenderness. Lungs: Clear to auscultation bilaterally. No Wheezing or Rhonchi. No rales. Heart: Regular rate and rhythm, no murmur, rub or gallop. Abdomen: Soft, non-tender,not distended. Bowel sounds normal. No masses Extremities: edema legs Skin: No rashes or lesions. Or bruising Lymph: Cervical, supraclavicular normal. Neurologic: Grossly non-focal Pertinent Labs Lab Results CBC    Component Value Date/Time   WBC 3.8 (L) 09/15/2021 0041   RBC 3.69 (L) 09/15/2021 1625   RBC 3.46 (L) 09/15/2021 0041   HGB 8.8 (L) 09/15/2021 0041   HCT 28.7 (L) 09/15/2021 0041   PLT 158 09/15/2021 0041   MCV 82.9 09/15/2021 0041   MCH 25.4 (L) 09/15/2021 0041   MCHC 30.7 09/15/2021 0041   RDW 15.3 09/15/2021 0041   LYMPHSABS 1.7 09/14/2021 1936   MONOABS 0.3 09/14/2021 1936   EOSABS 0.1 09/14/2021 1936   BASOSABS 0.0 09/14/2021 1936       Latest Ref Rng & Units 09/16/2021    9:47 AM 09/15/2021   12:41 AM 09/14/2021    7:36 PM  CMP  Glucose 70 - 99 mg/dL   102   BUN 6 - 20  mg/dL   13   Creatinine 0.44 - 1.00 mg/dL 0.85  0.67  0.76   Sodium 135 - 145 mmol/L   139   Potassium 3.5 - 5.1 mmol/L   3.8   Chloride 98 - 111 mmol/L   105   CO2 22 - 32 mmol/L   29   Calcium 8.9 - 10.3 mg/dL   8.9       Microbiology: Recent Results (from the past 240 hour(s))  Blood culture (routine x 2)     Status: None (Preliminary result)   Collection Time: 09/15/21 12:41 AM   Specimen: BLOOD  Result Value Ref Range Status   Specimen Description BLOOD LEFT AC  Final   Special Requests   Final    BOTTLES DRAWN AEROBIC AND ANAEROBIC Blood Culture adequate volume   Culture   Final    NO GROWTH 1 DAY Performed at [facility], [address] [place], [place] [address]    Report Status PENDING  Incomplete  Blood culture (routine x 2)     Status: None (Preliminary result)   Collection Time: 09/15/21 12:50 AM   Specimen: BLOOD LEFT HAND  Result Value Ref Range Status   Specimen Description BLOOD LEFT HAND  Final   Special Requests   Final    BOTTLES DRAWN AEROBIC AND ANAEROBIC Blood Culture adequate volume   Culture   Final    NO GROWTH 1 DAY Performed at [facility], [address] [place], [place] [address]    Report Status PENDING  Incomplete  MRSA Next Gen by PCR, Nasal     Status: None   Collection Time: 09/16/21  9:25 AM   Specimen: Nasal Mucosa; Nasal Swab  Result Value Ref Range Status   MRSA by PCR Next Gen NOT DETECTED NOT DETECTED Final    Comment: (NOTE) The GeneXpert MRSA Assay (FDA approved  for NASAL specimens only), is one component of a comprehensive MRSA colonization surveillance program. It is not intended to diagnose MRSA infection nor to guide or monitor treatment for MRSA infections. Test performance is not FDA approved in patients less than 18 years old. Performed at [facility], [address] [place], [place] [address]     IMAGING RESULTS: [place] legs- no DVT I have personally reviewed the  films ? Impression/Recommendation ? ?MRSA bacteremia and MRSA lumbar vertebral infection S/p L5-s1 hemilaminectomy on 7/21 PT left AMA after 3 weeks in hospital . During her stay she was on IV antibiotics and after she has been on linezolid . Readmitted on 8/19 for pain lower back and leg swelling MRI from 8/21 stable ESR/CRP much improved Blood culture neg so far Recommend neurosurgery to review MRI and ? Lumbar brace We may be able to discharge her on PO bactrim if blood culture remians neg tomorrow    Leg edema- no DVT  Anemia  Leucopenia  ?H/o L2-L3 fusion  IVDA- polysubstance use ___________________________________________________ Discussed with patient, requesting provider Note:  This document was prepared using Dragon voice recognition software and may include unintentional dictation errors.

## 2021-09-16 NOTE — Progress Notes (Signed)
[facility] Progress Note  Patient: Quadasia Newsham    WUJ:[id number]  DOA: 09/14/2021    Date of Service: the patient was seen and examined on 09/16/2021  Brief hospital course: 46 year old female with past medical history significant for tobacco abuse and polysubstance abuse including methamphetamines and heroin and secondary recurrent cervical and lumbar spinal vertebral infection requiring multiple hospitalizations for extended IV antibiotic therapy, IR drainage and laminectomy most recently from 7/10-7/31 for MRSA bacteremia from a lumbosacral infection and at that time she underwent L5-S1 hemilaminectomy for epidural abscess evacuation by neurosurgery.  Initial plan had been to treat patient with double MRSA coverage with ceftaroline and daptomycin and plans to continue daptomycin until 8/25, but then patient signed out AMA on 7/31 with a prescription for Zyvox.  Patient had seen infectious disease, Dr. Rivka Safer on 8/17 with complaints of lower extremity swelling and worsening back pain and had labs done at that time.  Patient was advised to go to the emergency room and so she presented on the evening of 8/19.  MRI revealed discitis/osteomyelitis from areas L4-S1 similar to MRI seen 7/18.  Patient had blood cultures done and started on IV vancomycin.  Infectious disease consulted.  Assessment and Plan: Assessment and Plan: * Lumbar discitis, recurrent Status post lumbosacral hemilaminectomy on 7/21 for MRSA bacteremia.  At that time, was supposed to receive double MRSA coverage with ceftaroline and daptomycin from 7/21 until 8/25, but left AMA on 7/31 with prescription for Zyvox.  Patient states she did take the entire course.  Infectious disease to follow.  For now, isolation precautions and IV vancomycin per pharmacy.  Lumbar MRI on admission notes no improvement or worsening compared to previous MRI right before surgery.  Infectious disease to see.  Have asked neurosurgery to weigh  in as well.  Hepatitis C antibody positive in blood Unclear if this was previously undiagnosed.  Patient had a normal hepatitis C antibody 4 years ago.  Infectious disease will follow. HCV RNA ordered.  Polysubstance dependence including opioid type drug, continuous use (HCC) Urine drug screen positive for cocaine.  Patient has abused IV heroin in the past.  Monitor closely.  I explained the patient that we will give her something for her pain, but we would not be able to get her pain under control given her addictions.  She understands.  Also so that we give her medication for her nerves which would help, which she was amenable to.  I have also ordered twice daily scheduled low-dose Ativan to keep her calm.   Tobacco abuse We will provide nicotine patch  Anemia Unclear etiology.  Likely some of this is from chronic disease given her osteomyelitis.  She normally runs between 11 and 12 although in July was around 10-11.  She presented with a hemoglobin of 9.8 on admission but down to 8.8 today.       Body mass index is 23.49 kg/m.        Consultants: Infectious disease Neurosurgery  Procedures: None  Antimicrobials: IV vancomycin 8/20-present  Code Status: Full code   Subjective: Currently patient resting comfortably  Objective: Blood pressure slightly elevated\ Vitals:   09/16/21 0400 09/16/21 1234  BP: (!) 157/93 (!) 141/79  Pulse: 89 82  Resp: 19 16  Temp: 98.4 F (36.9 C) 98.2 F (36.8 C)  SpO2: 100% 98%    Intake/Output Summary (Last 24 hours) at 09/16/2021 1250 Last data filed at 09/15/2021 1708 Gross per 24 hour  Intake 480 ml  Output --  Net 480 ml    Filed Weights   09/14/21 1935  Weight: 68 kg   Body mass index is 23.49 kg/m.  Exam:  General: Currently rest comfortably HEENT: Normocephalic, atraumatic, mucous membranes are moist Cardiovascular: Regular rate and rhythm, S1-S2 Respiratory: Clear to auscultation bilaterally Abdomen: Soft,  nontender, nondistended, positive bowel sounds Musculoskeletal: No clubbing or cyanosis or edema Skin: Patient has multiple scars and signs of previous skin abscesses Psychiatry: Appropriate, no evidence of psychoses Neurology: No focal deficits  Data Reviewed: Anemia panel unremarkable  Disposition:  Status is: Inpatient Remains inpatient appropriate because:  -Stabilization of infection -Evaluation of anemia -Evaluation of hepatitis C    Anticipated discharge date: To be determined, likely here for the next few weeks  Family Communication: Declined for me to call anyone DVT Prophylaxis: enoxaparin (LOVENOX) injection 40 mg Start: 09/15/21 0030    Author: Hollice Espy ,MD 09/16/2021 12:50 PM  To reach On-call, see care teams to locate the attending and reach out via [email or web address]. Between 7PM-7AM, please contact night-coverage If you still have difficulty reaching the attending provider, please page the [facility] (Director on Call) for [facility] on amion for assistance.

## 2021-09-16 NOTE — Progress Notes (Signed)
Mobility Specialist - Progress Note    09/16/21 1300  Mobility  Activity Refused mobility    Pt sleeping and is not willing to participate with mobility at this time. Will attempt at a different date/time.   Terrilyn Saver  Mobility Specialist  09/16/21 2:00 PM

## 2021-09-16 NOTE — Plan of Care (Signed)

## 2021-09-16 NOTE — Consult Note (Signed)
Referring Physician:  No referring provider defined for this encounter.  Primary Physician:  Pcp, No  History of Present Illness: 09/16/2021 Susan Terry was previously admitted with MRSA discitis.  She underwent L5/S1 laminoforaminotomy for drainage of an epidural collection on August 16, 2021.  She left AMA and presented back to the hospital with continuing pain.  08/14/21 Susan Terry is here today with a chief complaint of back pain.  She has recurrent discitis osteomyelitis with epidural component.  She has MRSA.   She has no weakness or bowel and bladder issues.   The symptoms are causing a significant impact on the patient's life.   Review of Systems:  A 10 point review of systems is negative, except for the pertinent positives and negatives detailed in the HPI.  Past Medical History: Past Medical History:  Diagnosis Date   Drug abuse (HCC)    Osteomyelitis (HCC)     Past Surgical History: Past Surgical History:  Procedure Laterality Date   BACK SURGERY     INCISION AND DRAINAGE FOREARM / WRIST DEEP Right    LUMBAR LAMINECTOMY FOR EPIDURAL ABSCESS Left 08/16/2021   Procedure: LUMBAR LAMINECTOMY FOR EPIDURAL ABSCESS;  Surgeon: Venetia Night, MD;  Location: [facility] ORS;  Service: Neurosurgery;  Laterality: Left;    Allergies: Allergies as of 09/14/2021   (No Known Allergies)    Medications: No outpatient medications have been marked as taking for the 09/14/21 encounter [facility] Encounter).    Social History: Social History   Tobacco Use   Smoking status: Former    Packs/day: 0.50    Types: Cigarettes   Smokeless tobacco: Never  Vaping Use   Vaping Use: Every day   Substances: Nicotine  Substance Use Topics   Alcohol use: No   Drug use: Yes    Types: Methamphetamines, IV    Comment: heroin    Family Medical History: Family History  Problem Relation Age of Onset   Cancer Mother        cancer throughout family    Physical  Examination: Vitals:   09/16/21 0400 09/16/21 1234  BP: (!) 157/93 (!) 141/79  Pulse: 89 82  Resp: 19 16  Temp: 98.4 F (36.9 C) 98.2 F (36.8 C)  SpO2: 100% 98%    General: Patient is well developed, well nourished, calm, collected, and in no apparent distress. Attention to examination is appropriate.  Neck:   Supple.  Full range of motion.  Respiratory: Patient is breathing without any difficulty.   NEUROLOGICAL:     Awake, alert, oriented to person, place, and time.  Speech is clear and fluent. Fund of knowledge is appropriate.   Cranial Nerves: Pupils equal round and reactive to light.  Facial tone is symmetric.  Facial sensation is symmetric. Shoulder shrug is symmetric. Tongue protrusion is midline.  There is no pronator drift.  ROM of spine: full.    Strength: Side Biceps Triceps Deltoid Interossei Grip Wrist Ext. Wrist Flex.  R 5 5 5 5 5 5 5   L 5 5 5 5 5 5 5    Side Iliopsoas Quads Hamstring PF DF EHL  R 5 5 5 5 5 5   L 5 5 5 5 5 5    Reflexes are 1+ and symmetric at the biceps, triceps, brachioradialis, patella and achilles.   Hoffman's is absent.  Clonus is not present.  Toes are down-going.  Bilateral upper and lower extremity sensation is intact to light touch.    No evidence of dysmetria  noted.  Gait is untested.   Incision c/d/i   Medical Decision Making  Imaging: MRI L spine 08/13/2021 IMPRESSION: 1. Progressive L4-L5 and L5-S1 disc edema as well as surrounding L4, L5 and superior S1 marrow edema, compatible with discitis/osteomyelitis given the clinical history and below findings. 2. New peripherally enhancing 7 x 8 left posterior dorsal epidural fluid collection at L5-S1, compatible with epidural abscess. Mild ventral epidural thickening at L5 could be venous plexus versus phlegmon. 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 3. Otherwise, similar multilevel degenerative change.   These results will be called to the ordering  clinician or representative by the Radiologist Assistant, and communication documented in the PACS or [employer].     Electronically Signed   By: Feliberto Harts M.D.   On: 08/13/2021 14:20  MRI L spine 09/14/21 IMPRESSION: 1. Overall unchanged L4-L5 disc edema with surrounding L4 and L5 marrow edema, compatible with discitis osteomyelitis with lesser changes at L5-S1, which also appears similar to 08/13/2021. 2. Status post interval L5-S1 left laminectomy and evacuation of the previously noted epidural collection. No new epidural collection is seen. 3. Redemonstrated mild ventral epidural thickening at L5, which could be venous plexus versus phlegmon, unchanged from the prior exam. 4. Small fluid collection in the subcutaneous tissues, likely related to recent surgery. This does demonstrate some associated contrast enhancement, but does not appear to approach the thecal sac. 5. Mild multilevel degenerative changes, unchanged compared to prior exams.     Electronically Signed   By: Wiliam Ke M.D.   On: 09/14/2021 23:20 I have personally reviewed the images and agree with the above interpretation.  Assessment and Plan: Susan Terry is a pleasant 46 y.o. female with back pain and known MRSA discitis/osteomyelitis/epidural abscess.  Her recent MRI scan does not show any epidural collection where she previously had surgery.  She has back pain but no clear radicular pain.  She has no weakness.  At this point, I do not think that surgical intervention is indicated.  Hopefully she is able to complete medical therapy this time.     Aston Lieske K. Myer Haff MD, [facility] Neurosurgery

## 2021-09-17 ENCOUNTER — Ambulatory Visit: Admission: RE | Admit: 2021-09-17 | Payer: Self-pay | Source: Ambulatory Visit

## 2021-09-17 ENCOUNTER — Other Ambulatory Visit: Payer: Self-pay

## 2021-09-17 LAB — BASIC METABOLIC PANEL
Anion gap: 6 (ref 5–15)
BUN: 13 mg/dL (ref 6–20)
CO2: 27 mmol/L (ref 22–32)
Calcium: 8.5 mg/dL — ABNORMAL LOW (ref 8.9–10.3)
Chloride: 105 mmol/L (ref 98–111)
Creatinine, Ser: 0.75 mg/dL (ref 0.44–1.00)
GFR, Estimated: >60 mL/min (ref >60)
Glucose, Bld: 139 mg/dL — ABNORMAL HIGH (ref 70–99)
Potassium: 3 mmol/L — ABNORMAL LOW (ref 3.5–5.1)
Sodium: 138 mmol/L (ref 135–145)

## 2021-09-17 LAB — HCV RNA DIAGNOSIS, NAA: HCV RNA, Quantitation: NOT DETECTED IU/mL

## 2021-09-17 LAB — CBC
HCT: 29.2 % — ABNORMAL LOW (ref 36.0–46.0)
Hemoglobin: 9.3 g/dL — ABNORMAL LOW (ref 12.0–15.0)
MCH: 25.8 pg — ABNORMAL LOW (ref 26.0–34.0)
MCHC: 31.8 g/dL (ref 30.0–36.0)
MCV: 80.9 fL (ref 80.0–100.0)
Platelets: 120 10*3/uL — ABNORMAL LOW (ref 150–400)
RBC: 3.61 MIL/uL — ABNORMAL LOW (ref 3.87–5.11)
RDW: 15.2 % (ref 11.5–15.5)
WBC: 7.9 10*3/uL (ref 4.0–10.5)
nRBC: 0 % (ref 0.0–0.2)

## 2021-09-17 MED ORDER — ACETAMINOPHEN 325 MG PO TABS: 650.0000 mg | ORAL_TABLET | Freq: Four times a day (QID) | ORAL | Status: AC | PRN

## 2021-09-17 MED ORDER — SULFAMETHOXAZOLE-TRIMETHOPRIM 800-160 MG PO TABS
1.0000 | ORAL_TABLET | Freq: Two times a day (BID) | ORAL | 0 refills | Status: AC
  Filled 2021-09-17: qty 42, 21d supply, fill #0

## 2021-09-17 MED ORDER — OXYCODONE HCL 5 MG PO TABS: 5.0000 mg | ORAL_TABLET | ORAL | 0 refills | Status: DC | PRN

## 2021-09-17 MED ORDER — POTASSIUM CHLORIDE CRYS ER 20 MEQ PO TBCR
40.0000 meq | EXTENDED_RELEASE_TABLET | ORAL | Status: AC
  Administered 2021-09-17 (×2): 40 meq via ORAL
  Filled 2021-09-17 (×2): qty 2

## 2021-09-17 NOTE — Discharge Summary (Signed)
Physician Discharge Summary   Patient: Susan Terry MRN: [id number] DOB: Mar 16, 1975  Admit date:     09/14/2021  Discharge date: 09/18/21  Discharge Physician: Pennie Banter   PCP: Pcp, No   Recommendations at discharge:    Follow up with Infectious Disease on 10/01/21 Follow up with Primary Care in 1-2 weeks Repeat CMP, CBC in 1-2 weeks Follow up pending HCV RNA Follow up on history of polysubstance abuse and resume Suboxone therapy when appropriate (currently requiring pain medication)  Discharge Diagnoses: Principal Problem:   Lumbar discitis, recurrent Active Problems:   Hepatitis C antibody positive in blood   Polysubstance dependence including opioid type drug, continuous use (HCC)   Tobacco abuse   Anemia   Anxiety  Resolved Problems:   * No resolved hospital problems. 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  Initial plan had been to treat patient with double MRSA coverage with ceftaroline and daptomycin and plans to continue daptomycin until 8/25, but then patient signed out AMA on 7/31 with a prescription for Zyvox.  Patient had seen infectious disease, Dr. Rivka Safer on 8/17 with complaints of lower extremity swelling and worsening back pain and had labs done at that time.  Patient was advised to go to the emergency room and so she presented on the evening of 8/19.  MRI revealed discitis/osteomyelitis from areas L4-S1 similar to MRI seen 7/18.  Patient had blood cultures done and started on IV vancomycin.   Infectious disease consulted.  Assessment and Plan: * Lumbar discitis, recurrent Status post lumbosacral hemilaminectomy on 7/21 for MRSA bacteremia.  At that time, was supposed to receive double MRSA coverage with ceftaroline and daptomycin from 7/21 until 8/25, but left AMA on 7/31 with prescription for Zyvox.  Patient states she did take the entire course.  Infectious disease consulted.   Treated with IV vancomycin per pharmacy.    Lumbar MRI on admission notes no improvement or worsening compared to previous MRI right before surgery. Neurosurgery - no intervention needed.  Discharge on PO Bactrim x 3 weeks. Infectious disease to see in clinic 9/22   Hepatitis C antibody positive in blood Unclear if this was previously undiagnosed.  Patient had a normal hepatitis C antibody 4 years ago.  Infectious disease follow up outpatient. HCV RNA pending, follow up.  Polysubstance dependence including opioid type drug, continuous use (HCC) Urine drug screen positive for cocaine.  Patient has abused IV heroin in the past.  Monitor closely.  I explained the patient that we will give her something for her pain, but we would not be able to get her pain under control given her addictions.  She understands.  Also so that we give her medication for her nerves which would help, which she was amenable to.  --Low dose Ativan while admitted   Tobacco abuse Nicotine patch  Anxiety Vistaril PRN at discharge, given pain med rx will avoid benzo's at d/c.  Anemia Unclear etiology.  Likely some of this is from chronic disease given her osteomyelitis.  She normally runs between 11 and 12 although in July was around 10-11.   Presented with a hemoglobin of 9.8,  stable at 9.3 today.         Consultants: Infectious disease, Neurosurgery Procedures performed: None  Disposition: Home Diet recommendation:  Regular diet DISCHARGE MEDICATION: Allergies as of 09/18/2021   No Known Allergies       Medication List     STOP taking these medications    linezolid 600 MG tablet Commonly known as: ZYVOX       TAKE these medications    acetaminophen 325 MG tablet Commonly known as: TYLENOL Take 2 tablets (650 mg total) by mouth every 6 (six) hours as needed for mild pain (or Fever >/= 101).   hydrOXYzine 50 MG capsule Commonly known as: Vistaril Take 1 capsule (50 mg total) by mouth 3 (three) times daily as needed.   oxyCODONE 5 MG immediate release tablet Commonly known as: Oxy IR/ROXICODONE Take 1 tablet (5 mg total) by mouth every 4 (four) hours as needed for moderate pain.   sulfamethoxazole-trimethoprim 800-160 MG tablet Commonly known as: BACTRIM DS Take 1 tablet by mouth 2 (two) times daily for 21 days.        Follow-up Information     Lynn Ito, MD. Go on 10/01/2021.   Specialty: Infectious Diseases Why: Follow up on 10/01/2021. Appt @ 10:30 am- call if needed to confirm appointment time. Contact information: [address] [place] [address] [phone]                Discharge Exam: Ceasar Mons Weights   09/14/21 1935  Weight: 68 kg   General exam: awake, alert, no acute distress HEENT: atraumatic, clear conjunctiva, anicteric sclera, moist mucus membranes, hearing grossly normal  Respiratory system: CTAB, no wheezes, rales or rhonchi, normal respiratory effort. Cardiovascular system: normal S1/S2, RRR, no JVD, murmurs, rubs, gallops, no pedal edema.   Gastrointestinal system: soft, NT, ND, no HSM felt, +bowel sounds. Central nervous system: A&O x.4. no gross focal neurologic deficits, normal speech Extremities: moves all, no edema, normal tone Skin: dry, intact, normal temperature Psychiatry: normal mood, congruent affect, judgement and insight appear normal   Condition at discharge: stable  The results of significant diagnostics from this hospitalization (including imaging, microbiology, ancillary and laboratory) are listed  below for reference.   Imaging Studies: MR Lumbar Spine W Wo Contrast  Result Date: 09/14/2021 CLINICAL DATA:  Recurrent lumbar discitis/osteomyelitis; recent positive cultures, increased pain; L5-S1 laminectomy July 2023 EXAM: MRI LUMBAR SPINE WITHOUT AND WITH CONTRAST TECHNIQUE: Multiplanar and multiecho pulse sequences of the lumbar spine were obtained without and with intravenous contrast. CONTRAST:  32mL GADAVIST GADOBUTROL 1 MMOL/ML IV SOLN COMPARISON:  08/13/2021 FINDINGS: Segmentation:  Standard. Alignment: Mild dextrocurvature. Trace retrolisthesis of L2 on L3, unchanged. Vertebrae: Redemonstrated increased T2 signal and enhancement in the L4-L5 disc space and vertebral bodies, with abnormal signal to a lesser extent in the L5-S1 disc. Minimal edema at the posterosuperior aspect of S1, unchanged. Prior L2-L3 posterior fusion with sequela of more remote discitis osteomyelitis. Conus medullaris and cauda equina: Conus extends to the L1 level. Conus appears normal. Previously noted peripherally enhancing collection in the left dorsal aspect of the spinal canal at L5-S1 is no longer seen, status post interval left L5-S1 laminectomy. Ventral epidural thickening and enhancement, which could represent venous plexus versus phlegmon, overall unchanged compared to the prior exam. Paraspinal and other soft tissues: Mild paraspinal edema without focal collection in the paraspinous muscles or psoas. Postsurgical changes posterior to L5-S1, with small fluid collection that measures approximately 7 x 5 x 15 mm; this  does demonstrate some enhancement (series 12, image 32), although this likely is inflammatory. No connection between this fluid collection and the thecal sac is seen. Disc levels: T12-L1: No significant disc bulge. No spinal canal stenosis or neural foraminal narrowing. L1-L2: No significant disc bulge. No spinal canal stenosis or neural foraminal narrowing. L2-L3: Disc collapse with posterior fusion. No  spinal canal stenosis or significant neural foraminal narrowing. L3-L4: Mild disc bulge. Moderate facet arthropathy. No spinal canal stenosis no neural foraminal narrowing. L4-L5: Mild disc bulge. Moderate facet arthropathy. Narrowing of the lateral recesses. No spinal canal stenosis or neural foraminal narrowing. L5-S1: Left laminectomy. Mild disc bulge with left paracentral and subarticular protrusion, which likely compresses the descending left S1 nerve roots. Mild facet arthropathy. No spinal canal stenosis. Moderate bilateral neural foraminal narrowing, unchanged. IMPRESSION: 1. Overall unchanged L4-L5 disc edema with surrounding L4 and L5 marrow edema, compatible with discitis osteomyelitis with lesser changes at L5-S1, which also appears similar to 08/13/2021. 2. Status post interval L5-S1 left laminectomy and evacuation of the previously noted epidural collection. No new epidural collection is seen. 3. Redemonstrated mild ventral epidural thickening at L5, which could be venous plexus versus phlegmon, unchanged from the prior exam. 4. Small fluid collection in the subcutaneous tissues, likely related to recent surgery. This does demonstrate some associated contrast enhancement, but does not appear to approach the thecal sac. 5. Mild multilevel degenerative changes, unchanged compared to prior exams. Electronically Signed   By: Wiliam Ke M.D.   On: 09/14/2021 23:20   DG Chest Portable 1 View  Result Date: 09/14/2021 CLINICAL DATA:  leg swelling, eval for cardiomegal, edema EXAM: PORTABLE CHEST 1 VIEW COMPARISON:  Chest radiograph 12/09/2020 FINDINGS: The heart is normal in size for technique. The cardiomediastinal contours are normal. The lungs are clear. Pulmonary vasculature is normal. No consolidation, pleural effusion, or pneumothorax. No acute osseous abnormalities are seen. IMPRESSION: No acute chest findings. Normal heart size for technique. No pulmonary edema. Electronically Signed   By:  Narda Rutherford M.D.   On: 09/14/2021 22:11   US Venous Img Lower Bilateral  Result Date: 09/14/2021 CLINICAL DATA:  Leg swelling EXAM: BILATERAL LOWER EXTREMITY VENOUS DOPPLER ULTRASOUND TECHNIQUE: Gray-scale sonography with compression, as well as color and duplex ultrasound, were performed to evaluate the deep venous system(s) from the level of the common femoral vein through the popliteal and proximal calf veins. COMPARISON:  None Available. FINDINGS: VENOUS Normal compressibility of the common femoral, superficial femoral, and popliteal veins, as well as the visualized calf veins. Visualized portions of profunda femoral vein and great saphenous vein unremarkable. No filling defects to suggest DVT on grayscale or color Doppler imaging. Doppler waveforms show normal direction of venous flow, normal respiratory plasticity and response to augmentation. Limited views of the contralateral common femoral vein are unremarkable. OTHER None. Limitations: none IMPRESSION: Negative. Electronically Signed   By: Wiliam Ke M.D.   On: 09/14/2021 21:22    Microbiology: Results for orders placed or performed during the hospital encounter of 09/14/21  Blood culture (routine x 2)     Status: None (Preliminary result)   Collection Time: 09/15/21 12:41 AM   Specimen: BLOOD  Result Value Ref Range Status   Specimen Description BLOOD LEFT [facility]  Final   Special Requests   Final    BOTTLES DRAWN AEROBIC AND ANAEROBIC Blood Culture adequate volume   Culture   Final    NO GROWTH 1 DAY Performed at [facility], [address]  [place], [place] [address]    Report Status PENDING  Incomplete  Blood culture (routine x 2)     Status: None (Preliminary result)   Collection Time: 09/15/21 12:50 AM   Specimen: BLOOD LEFT HAND  Result Value Ref Range Status   Specimen Description BLOOD LEFT HAND  Final   Special Requests   Final    BOTTLES DRAWN AEROBIC AND ANAEROBIC Blood Culture adequate volume   Culture    Final    NO GROWTH 1 DAY Performed at [facility], [address], [place], [place] [address]    Report Status PENDING  Incomplete  MRSA Next Gen by PCR, Nasal     Status: None   Collection Time: 09/16/21  9:25 AM   Specimen: Nasal Mucosa; Nasal Swab  Result Value Ref Range Status   MRSA by PCR Next Gen NOT DETECTED NOT DETECTED Final    Comment: (NOTE) The GeneXpert MRSA Assay (FDA approved for NASAL specimens only), is one component of a comprehensive MRSA colonization surveillance program. It is not intended to diagnose MRSA infection nor to guide or monitor treatment for MRSA infections. Test performance is not FDA approved in patients less than 81 years old. Performed at [facility], [address] [place], [place] [address]     Labs: CBC: Recent Labs  Lab 09/12/21 1255 09/14/21 1936 09/15/21 0041 09/17/21 0330  WBC 5.6 4.5 3.8* 7.9  NEUTROABS 3.7 2.3  --   --   HGB 9.8* 9.6* 8.8* 9.3*  HCT 30.6* 30.7* 28.7* 29.2*  MCV 79.9* 83.2 82.9 80.9  PLT 178 166 158 120*   Basic Metabolic Panel: Recent Labs  Lab 09/12/21 1255 09/14/21 1936 09/15/21 0041 09/16/21 0947 09/17/21 0330 09/18/21 0412  NA 136 139  --   --  138  --   K 3.3* 3.8  --   --  3.0*  --   CL 101 105  --   --  105  --   CO2 26 29  --   --  27  --   GLUCOSE 132* 102*  --   --  139*  --   BUN 13 13  --   --  13  --   CREATININE 0.84 0.76 0.67 0.85 0.75 0.69  CALCIUM 9.3 8.9  --   --  8.5*  --    Liver Function Tests: Recent Labs  Lab 09/12/21 1255  AST 21  ALT 14  ALKPHOS 81  BILITOT 0.3  PROT 8.1  ALBUMIN 3.7   CBG: No results for input(s): "GLUCAP" in the last 168 hours.  Discharge time spent: greater than 30 minutes.  Signed: Pennie Banter, DO [facility] 09/18/2021

## 2021-09-17 NOTE — Plan of Care (Signed)
?  Problem: Education: ?Goal: Knowledge of General Education information will improve ?Description: Including pain rating scale, medication(s)/side effects and non-pharmacologic comfort measures ?Outcome: Progressing ?  ?Problem: Health Behavior/Discharge Planning: ?Goal: Ability to manage health-related needs will improve ?Outcome: Progressing ?  ?Problem: Clinical Measurements: ?Goal: Cardiovascular complication will be avoided ?Outcome: Progressing ?  ?Problem: Nutrition: ?Goal: Adequate nutrition will be maintained ?Outcome: Progressing ?  ?Problem: Coping: ?Goal: Level of anxiety will decrease ?Outcome: Progressing ?  ?Problem: Elimination: ?Goal: Will not experience complications related to bowel motility ?Outcome: Progressing ?Goal: Will not experience complications related to urinary retention ?Outcome: Progressing ?  ?Problem: Pain Managment: ?Goal: General experience of comfort will improve ?Outcome: Progressing ?  ?

## 2021-09-18 ENCOUNTER — Other Ambulatory Visit: Payer: Self-pay

## 2021-09-18 DIAGNOSIS — F419 Anxiety disorder, unspecified: Secondary | ICD-10-CM | POA: Diagnosis present

## 2021-09-18 LAB — CREATININE, SERUM
Creatinine, Ser: 0.69 mg/dL (ref 0.44–1.00)
GFR, Estimated: >60 mL/min (ref >60)

## 2021-09-18 MED ORDER — SULFAMETHOXAZOLE-TRIMETHOPRIM 800-160 MG PO TABS: 1.0000 | ORAL_TABLET | Freq: Two times a day (BID) | ORAL | Status: DC

## 2021-09-18 MED ORDER — HYDROXYZINE PAMOATE 50 MG PO CAPS
50.0000 mg | ORAL_CAPSULE | Freq: Three times a day (TID) | ORAL | 1 refills | Status: AC | PRN
  Filled 2021-09-18: qty 30, 10d supply, fill #0
  Filled 2021-10-02: qty 30, 10d supply, fill #1

## 2021-09-18 NOTE — Assessment & Plan Note (Signed)
Vistaril PRN at discharge, given pain med rx will avoid benzo's at d/c.

## 2021-09-18 NOTE — Plan of Care (Signed)
  Problem: Education: Goal: Knowledge of General Education information will improve Description: Including pain rating scale, medication(s)/side effects and non-pharmacologic comfort measures 09/18/2021 0217 by Sarajane Marek, LPN Outcome: Progressing   Problem: Health Behavior/Discharge Planning: Goal: Ability to manage health-related needs will improve 09/18/2021 0217 by Sarajane Marek, LPN Outcome: Progressing   Problem: Clinical Measurements: Goal: Ability to maintain clinical measurements within normal limits will improve 09/18/2021 0217 by Sarajane Marek, LPN Outcome: Progressing   Problem: Clinical Measurements: Goal: Will remain free from infection 09/18/2021 0217 by Sarajane Marek, LPN Outcome: Progressing   Problem: Clinical Measurements: Goal: Respiratory complications will improve 09/18/2021 0217 by Sarajane Marek, LPN Outcome: Progressing   Problem: Clinical Measurements: Goal: Cardiovascular complication will be avoided 09/18/2021 0217 by Sarajane Marek, LPN Outcome: Progressing   Problem: Activity: Goal: Risk for activity intolerance will decrease 09/18/2021 0217 by Sarajane Marek, LPN Outcome: Progressing   Problem: Nutrition: Goal: Adequate nutrition will be maintained 09/18/2021 0217 by Sarajane Marek, LPN Outcome: Progressing   Problem: Coping: Goal: Level of anxiety will decrease 09/18/2021 0217 by Sarajane Marek, LPN Outcome: Progressing   Problem: Elimination: Goal: Will not experience complications related to bowel motility 09/18/2021 0217 by Sarajane Marek, LPN Outcome: Progressing   Problem: Elimination: Goal: Will not experience complications related to urinary retention 09/18/2021 0217 by Sarajane Marek, LPN Outcome: Progressing   Problem: Pain Managment: Goal: General experience of comfort will improve 09/18/2021 0217 by Sarajane Marek, LPN Outcome: Progressing   Problem: Safety: Goal: Ability to remain free from injury will  improve 09/18/2021 0217 by Sarajane Marek, LPN Outcome: Progressing   Problem: Skin Integrity: Goal: Risk for impaired skin integrity will decrease 09/18/2021 0217 by Sarajane Marek, LPN Outcome: Progressing

## 2021-09-19 ENCOUNTER — Ambulatory Visit: Admission: RE | Admit: 2021-09-19 | Payer: Medicaid Other | Source: Ambulatory Visit

## 2021-09-20 LAB — CULTURE, BLOOD (ROUTINE X 2)
Culture: NO GROWTH
Culture: NO GROWTH
Special Requests: ADEQUATE
Special Requests: ADEQUATE

## 2021-09-26 ENCOUNTER — Encounter: Payer: Self-pay | Admitting: Neurosurgery

## 2021-10-01 ENCOUNTER — Ambulatory Visit: Payer: Medicaid Other | Admitting: Infectious Diseases

## 2021-10-02 ENCOUNTER — Other Ambulatory Visit: Payer: Self-pay

## 2021-10-25 ENCOUNTER — Other Ambulatory Visit: Payer: Self-pay

## 2021-11-07 ENCOUNTER — Encounter: Payer: Self-pay | Admitting: Neurosurgery

## 2021-11-10 ENCOUNTER — Inpatient Hospital Stay
Admission: EM | Admit: 2021-11-10 | Discharge: 2021-11-11 | DRG: 917 | Payer: Self-pay | Attending: Obstetrics and Gynecology | Admitting: Obstetrics and Gynecology

## 2021-11-10 ENCOUNTER — Inpatient Hospital Stay: Payer: Self-pay

## 2021-11-10 ENCOUNTER — Emergency Department: Payer: Self-pay

## 2021-11-10 DIAGNOSIS — T68XXXA Hypothermia, initial encounter: Secondary | ICD-10-CM

## 2021-11-10 DIAGNOSIS — E872 Acidosis, unspecified: Secondary | ICD-10-CM | POA: Diagnosis present

## 2021-11-10 DIAGNOSIS — T40601A Poisoning by unspecified narcotics, accidental (unintentional), initial encounter: Secondary | ICD-10-CM | POA: Diagnosis present

## 2021-11-10 DIAGNOSIS — Z1152 Encounter for screening for COVID-19: Secondary | ICD-10-CM | POA: Diagnosis not present

## 2021-11-10 DIAGNOSIS — F1120 Opioid dependence, uncomplicated: Secondary | ICD-10-CM | POA: Diagnosis present

## 2021-11-10 DIAGNOSIS — A549 Gonococcal infection, unspecified: Secondary | ICD-10-CM | POA: Diagnosis present

## 2021-11-10 DIAGNOSIS — R768 Other specified abnormal immunological findings in serum: Secondary | ICD-10-CM | POA: Diagnosis present

## 2021-11-10 DIAGNOSIS — E876 Hypokalemia: Secondary | ICD-10-CM | POA: Diagnosis present

## 2021-11-10 DIAGNOSIS — Z87891 Personal history of nicotine dependence: Secondary | ICD-10-CM

## 2021-11-10 DIAGNOSIS — M545 Low back pain, unspecified: Secondary | ICD-10-CM | POA: Diagnosis present

## 2021-11-10 DIAGNOSIS — R7689 Other specified abnormal immunological findings in serum: Secondary | ICD-10-CM | POA: Diagnosis present

## 2021-11-10 DIAGNOSIS — R680 Hypothermia, not associated with low environmental temperature: Secondary | ICD-10-CM | POA: Diagnosis present

## 2021-11-10 DIAGNOSIS — B1920 Unspecified viral hepatitis C without hepatic coma: Secondary | ICD-10-CM | POA: Diagnosis present

## 2021-11-10 DIAGNOSIS — R4189 Other symptoms and signs involving cognitive functions and awareness: Principal | ICD-10-CM

## 2021-11-10 DIAGNOSIS — F419 Anxiety disorder, unspecified: Secondary | ICD-10-CM | POA: Diagnosis present

## 2021-11-10 DIAGNOSIS — Z720 Tobacco use: Secondary | ICD-10-CM | POA: Diagnosis present

## 2021-11-10 DIAGNOSIS — F1990 Other psychoactive substance use, unspecified, uncomplicated: Secondary | ICD-10-CM | POA: Diagnosis present

## 2021-11-10 DIAGNOSIS — F1420 Cocaine dependence, uncomplicated: Secondary | ICD-10-CM | POA: Diagnosis present

## 2021-11-10 DIAGNOSIS — E860 Dehydration: Secondary | ICD-10-CM | POA: Diagnosis present

## 2021-11-10 DIAGNOSIS — G9341 Metabolic encephalopathy: Secondary | ICD-10-CM

## 2021-11-10 DIAGNOSIS — F1410 Cocaine abuse, uncomplicated: Secondary | ICD-10-CM

## 2021-11-10 DIAGNOSIS — R4182 Altered mental status, unspecified: Secondary | ICD-10-CM | POA: Diagnosis present

## 2021-11-10 DIAGNOSIS — A419 Sepsis, unspecified organism: Secondary | ICD-10-CM | POA: Diagnosis present

## 2021-11-10 LAB — COMPREHENSIVE METABOLIC PANEL
ALT: 24 U/L (ref 0–44)
AST: 35 U/L (ref 15–41)
Albumin: 3.8 g/dL (ref 3.5–5.0)
Alkaline Phosphatase: 97 U/L (ref 38–126)
Anion gap: 13 (ref 5–15)
BUN: 11 mg/dL (ref 6–20)
CO2: 21 mmol/L — ABNORMAL LOW (ref 22–32)
Calcium: 9.2 mg/dL (ref 8.9–10.3)
Chloride: 104 mmol/L (ref 98–111)
Creatinine, Ser: 0.78 mg/dL (ref 0.44–1.00)
GFR, Estimated: >60 mL/min (ref >60)
Glucose, Bld: 123 mg/dL — ABNORMAL HIGH (ref 70–99)
Potassium: 2.4 mmol/L — CL (ref 3.5–5.1)
Sodium: 138 mmol/L (ref 135–145)
Total Bilirubin: 0.6 mg/dL (ref 0.3–1.2)
Total Protein: 8.2 g/dL — ABNORMAL HIGH (ref 6.5–8.1)

## 2021-11-10 LAB — CBC WITH DIFFERENTIAL/PLATELET
Abs Immature Granulocytes: 0.02 10*3/uL (ref 0.00–0.07)
Basophils Absolute: 0 10*3/uL (ref 0.0–0.1)
Basophils Relative: 1 %
Eosinophils Absolute: 0.2 10*3/uL (ref 0.0–0.5)
Eosinophils Relative: 3 %
HCT: 37.2 % (ref 36.0–46.0)
Hemoglobin: 11.7 g/dL — ABNORMAL LOW (ref 12.0–15.0)
Immature Granulocytes: 0 %
Lymphocytes Relative: 48 %
Lymphs Abs: 2.6 10*3/uL (ref 0.7–4.0)
MCH: 24.8 pg — ABNORMAL LOW (ref 26.0–34.0)
MCHC: 31.5 g/dL (ref 30.0–36.0)
MCV: 79 fL — ABNORMAL LOW (ref 80.0–100.0)
Monocytes Absolute: 0.4 10*3/uL (ref 0.1–1.0)
Monocytes Relative: 8 %
Neutro Abs: 2.2 10*3/uL (ref 1.7–7.7)
Neutrophils Relative %: 40 %
Platelets: 274 10*3/uL (ref 150–400)
RBC: 4.71 MIL/uL (ref 3.87–5.11)
RDW: 13.9 % (ref 11.5–15.5)
WBC: 5.5 10*3/uL (ref 4.0–10.5)
nRBC: 0 % (ref 0.0–0.2)

## 2021-11-10 LAB — HCG, QUANTITATIVE, PREGNANCY: hCG, Beta Chain, Quant, S: <1 m[IU]/mL (ref <5)

## 2021-11-10 LAB — URINALYSIS, ROUTINE W REFLEX MICROSCOPIC
Bilirubin Urine: NEGATIVE
Glucose, UA: NEGATIVE mg/dL
Hgb urine dipstick: NEGATIVE
Ketones, ur: 20 mg/dL — AB
Nitrite: NEGATIVE
Protein, ur: NEGATIVE mg/dL
Specific Gravity, Urine: 1.013 (ref 1.005–1.030)
pH: 7 (ref 5.0–8.0)

## 2021-11-10 LAB — URINE DRUG SCREEN, QUALITATIVE (ARMC ONLY)
Amphetamines, Ur Screen: NOT DETECTED
Barbiturates, Ur Screen: NOT DETECTED
Benzodiazepine, Ur Scrn: NOT DETECTED
Cannabinoid 50 Ng, Ur ~~LOC~~: NOT DETECTED
Cocaine Metabolite,Ur ~~LOC~~: POSITIVE — AB
MDMA (Ecstasy)Ur Screen: NOT DETECTED
Methadone Scn, Ur: NOT DETECTED
Opiate, Ur Screen: POSITIVE — AB
Phencyclidine (PCP) Ur S: NOT DETECTED
Tricyclic, Ur Screen: NOT DETECTED

## 2021-11-10 LAB — LACTIC ACID, PLASMA
Lactic Acid, Venous: 4.4 mmol/L (ref 0.5–1.9)
Lactic Acid, Venous: 0.7 mmol/L (ref 0.5–1.9)

## 2021-11-10 LAB — HIV ANTIBODY (ROUTINE TESTING W REFLEX): HIV Screen 4th Generation wRfx: NONREACTIVE

## 2021-11-10 LAB — CHLAMYDIA/NGC RT PCR (ARMC ONLY)
Chlamydia Tr: NOT DETECTED
N gonorrhoeae: DETECTED — AB

## 2021-11-10 LAB — SALICYLATE LEVEL: Salicylate Lvl: <7 mg/dL — ABNORMAL LOW (ref 7.0–30.0)

## 2021-11-10 LAB — CK: Total CK: 45 U/L (ref 38–234)

## 2021-11-10 LAB — PROCALCITONIN: Procalcitonin: <0.1 ng/mL

## 2021-11-10 LAB — TROPONIN I (HIGH SENSITIVITY)
Troponin I (High Sensitivity): 3 ng/L (ref <18)
Troponin I (High Sensitivity): 3 ng/L (ref <18)

## 2021-11-10 LAB — ACETAMINOPHEN LEVEL: Acetaminophen (Tylenol), Serum: <10 ug/mL — ABNORMAL LOW (ref 10–30)

## 2021-11-10 LAB — SARS CORONAVIRUS 2 BY RT PCR: SARS Coronavirus 2 by RT PCR: NEGATIVE

## 2021-11-10 LAB — SEDIMENTATION RATE: Sed Rate: 39 mm/hr — ABNORMAL HIGH (ref 0–20)

## 2021-11-10 LAB — ETHANOL: Alcohol, Ethyl (B): <10 mg/dL (ref <10)

## 2021-11-10 LAB — MAGNESIUM: Magnesium: 1.3 mg/dL — ABNORMAL LOW (ref 1.7–2.4)

## 2021-11-10 LAB — C-REACTIVE PROTEIN: CRP: 0.6 mg/dL (ref <1.0)

## 2021-11-10 MED ORDER — POTASSIUM CHLORIDE 10 MEQ/100ML IV SOLN
10.0000 meq | Freq: Once | INTRAVENOUS | Status: DC
  Filled 2021-11-10 (×2): qty 100

## 2021-11-10 MED ORDER — POTASSIUM CHLORIDE IN NACL 20-0.9 MEQ/L-% IV SOLN
INTRAVENOUS | Status: DC
  Filled 2021-11-10 (×3): qty 1000

## 2021-11-10 MED ORDER — POTASSIUM CHLORIDE 10 MEQ/100ML IV SOLN
10.0000 meq | Freq: Once | INTRAVENOUS | Status: AC
  Administered 2021-11-10: 10 meq via INTRAVENOUS

## 2021-11-10 MED ORDER — SODIUM CHLORIDE 0.9 % IV BOLUS (SEPSIS)
250.0000 mL | Freq: Once | INTRAVENOUS | Status: AC
  Administered 2021-11-10: 250 mL via INTRAVENOUS

## 2021-11-10 MED ORDER — SODIUM CHLORIDE 0.9 % IV BOLUS (SEPSIS)
1000.0000 mL | Freq: Once | INTRAVENOUS | Status: AC
  Administered 2021-11-10: 1000 mL via INTRAVENOUS

## 2021-11-10 MED ORDER — ONDANSETRON 4 MG PO TBDP
4.0000 mg | ORAL_TABLET | Freq: Four times a day (QID) | ORAL | Status: DC | PRN
  Administered 2021-11-10: 4 mg via ORAL
  Filled 2021-11-10: qty 1

## 2021-11-10 MED ORDER — OXYCODONE HCL 5 MG PO TABS
5.0000 mg | ORAL_TABLET | ORAL | Status: DC | PRN
  Administered 2021-11-10 – 2021-11-11 (×4): 5 mg via ORAL
  Filled 2021-11-10 (×4): qty 1

## 2021-11-10 MED ORDER — MORPHINE SULFATE (PF) 4 MG/ML IV SOLN
INTRAVENOUS | Status: AC
  Administered 2021-11-10: 4 mg via INTRAMUSCULAR
  Filled 2021-11-10: qty 1

## 2021-11-10 MED ORDER — ONDANSETRON HCL 4 MG/2ML IJ SOLN
4.0000 mg | Freq: Once | INTRAMUSCULAR | Status: AC
  Administered 2021-11-10: 4 mg via INTRAVENOUS
  Filled 2021-11-10: qty 2

## 2021-11-10 MED ORDER — POTASSIUM CHLORIDE 10 MEQ/100ML IV SOLN
10.0000 meq | Freq: Once | INTRAVENOUS | Status: AC
  Filled 2021-11-10: qty 100

## 2021-11-10 MED ORDER — VANCOMYCIN HCL IN DEXTROSE 1-5 GM/200ML-% IV SOLN
1000.0000 mg | Freq: Once | INTRAVENOUS | Status: AC
  Administered 2021-11-10: 1000 mg via INTRAVENOUS
  Filled 2021-11-10: qty 200

## 2021-11-10 MED ORDER — METRONIDAZOLE 500 MG/100ML IV SOLN
500.0000 mg | Freq: Once | INTRAVENOUS | Status: AC
  Administered 2021-11-10: 500 mg via INTRAVENOUS
  Filled 2021-11-10: qty 100

## 2021-11-10 MED ORDER — VANCOMYCIN HCL 750 MG/150ML IV SOLN
750.0000 mg | Freq: Once | INTRAVENOUS | Status: AC
  Administered 2021-11-10: 750 mg via INTRAVENOUS
  Filled 2021-11-10: qty 150

## 2021-11-10 MED ORDER — POTASSIUM CHLORIDE 10 MEQ/100ML IV SOLN
INTRAVENOUS | Status: AC
  Administered 2021-11-10: 10 meq via INTRAVENOUS
  Filled 2021-11-10: qty 100

## 2021-11-10 MED ORDER — SODIUM CHLORIDE 0.9 % IV SOLN
2.0000 g | Freq: Three times a day (TID) | INTRAVENOUS | Status: DC
  Administered 2021-11-10: 2 g via INTRAVENOUS
  Filled 2021-11-10: qty 12.5

## 2021-11-10 MED ORDER — MORPHINE SULFATE (PF) 4 MG/ML IV SOLN: 4.0000 mg | Freq: Once | INTRAVENOUS | Status: AC

## 2021-11-10 MED ORDER — SODIUM CHLORIDE 0.9 % IV BOLUS: 1000.0000 mL | Freq: Once | INTRAVENOUS | Status: DC

## 2021-11-10 MED ORDER — HYDROXYZINE HCL 25 MG PO TABS
50.0000 mg | ORAL_TABLET | Freq: Three times a day (TID) | ORAL | Status: DC | PRN
  Administered 2021-11-10 – 2021-11-11 (×2): 50 mg via ORAL
  Filled 2021-11-10 (×2): qty 2

## 2021-11-10 MED ORDER — AZITHROMYCIN 500 MG PO TABS
1000.0000 mg | ORAL_TABLET | Freq: Once | ORAL | Status: AC
  Administered 2021-11-10: 1000 mg via ORAL
  Filled 2021-11-10: qty 2

## 2021-11-10 MED ORDER — SODIUM CHLORIDE 0.9 % IV SOLN
2.0000 g | Freq: Once | INTRAVENOUS | Status: AC
  Administered 2021-11-10: 2 g via INTRAVENOUS
  Filled 2021-11-10: qty 12.5

## 2021-11-10 MED ORDER — MAGNESIUM SULFATE 4 GM/100ML IV SOLN
4.0000 g | Freq: Once | INTRAVENOUS | Status: AC
  Administered 2021-11-10: 4 g via INTRAVENOUS
  Filled 2021-11-10: qty 100

## 2021-11-10 MED ORDER — SODIUM CHLORIDE 0.9 % IV SOLN
1.0000 g | INTRAVENOUS | Status: DC
  Administered 2021-11-10: 1 g via INTRAVENOUS
  Filled 2021-11-10: qty 10

## 2021-11-10 MED ORDER — VANCOMYCIN HCL IN DEXTROSE 1-5 GM/200ML-% IV SOLN
1000.0000 mg | Freq: Two times a day (BID) | INTRAVENOUS | Status: DC
  Administered 2021-11-11: 1000 mg via INTRAVENOUS
  Filled 2021-11-10: qty 200

## 2021-11-10 NOTE — ED Notes (Signed)
Officer remains at bedside

## 2021-11-10 NOTE — ED Notes (Signed)
Pt refused to go to MRI at this time because too anxious.

## 2021-11-10 NOTE — ED Notes (Signed)
Overdue potassium will be given once available from pharmacy. Pharmacy was notified that ED pyxis is out.

## 2021-11-10 NOTE — ED Notes (Signed)
Provided water per request.  

## 2021-11-10 NOTE — Assessment & Plan Note (Signed)
Repleted in the ED

## 2021-11-10 NOTE — ED Notes (Signed)
Pt demanding IV ativan. States cannot stay still, is having panic attack, and that both arms and legs are numb, shaking and painful. Told her will inform provider.

## 2021-11-10 NOTE — ED Notes (Signed)
Pt has been thrashing in bed (with 1 arm and 1 leg cuffed to bed) for past hour. Pt claims is having panic attack and needs anti anxiety meds to calm her. Provider messaged.

## 2021-11-10 NOTE — ED Notes (Signed)
Dr. Wouk at bedside 

## 2021-11-10 NOTE — ED Notes (Signed)
Provided ginger ale per request.

## 2021-11-10 NOTE — ED Notes (Signed)
Received bedside report from night RN. Pt resting in bed, prison guard in room. Bear hugger in place, temp reading from rectal probe is now above 98. HR now in 50s (was in 40s last night). Pt was  unresponsive upon arrival and is now arousable, asking for gingerale. Pt has 3rd of 4 potassium chloride hanging and last vanc dose hanging. Pt in NAD. Hx Ivdrug abuse, claims last use was 2 days PTA.

## 2021-11-10 NOTE — ED Notes (Signed)
Pt transported to CT with this RN and jail officer

## 2021-11-10 NOTE — Assessment & Plan Note (Addendum)
Possibly related to substance withdrawal, given patient is now in jail Patient had alternating periods of agitation and unresponsiveness Received morphine in order to obtain CAT scan Neurologic checks, IV hydration Keep n.p.o. with fall and aspiration precautions

## 2021-11-10 NOTE — Assessment & Plan Note (Signed)
UDS positive for cocaine and opiates Prior history of IVDU

## 2021-11-10 NOTE — ED Notes (Signed)
Per Dr Si Raider, remove foley and rectal probe and bear hugger.

## 2021-11-10 NOTE — ED Notes (Signed)
Pt asleep.

## 2021-11-10 NOTE — H&P (Signed)
History and Physical    Patient: Susan Terry IRS:[id number] DOB: 1975-11-21 DOA: 11/10/2021 DOS: the patient was seen and examined on 11/10/2021 PCP: Pcp, No  Patient coming from:  jail  Chief Complaint:  Chief Complaint  Patient presents with   Altered Mental Status    HPI: Susan Terry is a 46 y.o. female with medical history significant for Polysubstance abuse, recurrent lumbar discitis, hepatitis C antibody positive, nicotine dependence, anxiety, who presents to the ED From jail with altered mental status after she was found unresponsive.  EMS reports that patient was bradycardic and nonresponsive to first administered dose of Narcan.  On arrival however she was intermittently very agitated flailing all of her extremities but then would fall back into unresponsiveness. ED course and data review: Vitals notable for hypothermia of 95 and bradycardia of 58.  Labs notable for positive UDS for cocaine and opiates, elevated lactic acid of 4.4 with normal WBC.  Potassium 2.4.  EtOH, acetaminophen and salicylate levels were undetectable. ABG remarkable only for PO2 of 72. EKG, personally viewed and interpreted showing sinus at 61 with prolonged QT of 539.  Chest x-ray nonacute, head CT nonacute.  CT abdomen and pelvis showed a catheter like structure within the rectum which was subsequently found to be the temperature probe. Patient was placed in a Bair hugger for hypothermia, treated with an IV fluid bolus and empirically started on Maxipime and Flagyl and vancomycin for sepsis of unknown source.  Hospitalist consulted for admission.     Past Medical History:  Diagnosis Date   Drug abuse (HCC)    Osteomyelitis (HCC)    Past Surgical History:  Procedure Laterality Date   BACK SURGERY     INCISION AND DRAINAGE FOREARM / WRIST DEEP Right    LUMBAR LAMINECTOMY FOR EPIDURAL ABSCESS Left 08/16/2021   Procedure: LUMBAR LAMINECTOMY FOR EPIDURAL ABSCESS;  Surgeon: Venetia Night, MD;  Location: [facility] ORS;  Service: Neurosurgery;  Laterality: Left;   Social History:  reports that she has quit smoking. Her smoking use included cigarettes. She smoked an average of .5 packs per day. She has never used smokeless tobacco. She reports current drug use. Drugs: Methamphetamines and IV. She reports that she does not drink alcohol.  No Known Allergies  Family History  Problem Relation Age of Onset   Cancer Mother        cancer throughout family    Prior to Admission medications   Medication Sig Start Date End Date Taking? Authorizing Provider  acetaminophen (TYLENOL) 325 MG tablet Take 2 tablets (650 mg total) by mouth every 6 (six) hours as needed for mild pain (or Fever >/= 101). 09/17/21   Esaw Grandchild A, DO  hydrOXYzine (VISTARIL) 50 MG capsule Take 1 capsule (50 mg total) by mouth 3 (three) times daily as needed. 09/18/21   Esaw Grandchild A, DO  oxyCODONE (OXY IR/ROXICODONE) 5 MG immediate release tablet Take 1 tablet (5 mg total) by mouth every 4 (four) hours as needed for moderate pain. 09/17/21   Pennie Banter, DO    Physical Exam: Vitals:   11/10/21 0200 11/10/21 0249 11/10/21 0344 11/10/21 0403  BP: 125/75 118/77 113/61 112/60  Pulse: (!) 54 (!) 46 (!) 48 (!) 56  Resp: 19 18 (!) 22 (!) 24  Temp: (!) 95 F (35 C) (!) 97 F (36.1 C) (!) 96.8 F (36 C) (!) 96.7 F (35.9 C)  TempSrc: Rectal Rectal Rectal Rectal  SpO2: 100% 98% 99% 98%  Physical Exam Vitals and nursing note reviewed.  Constitutional:      General: She is not in acute distress. HENT:     Head: Normocephalic and atraumatic.  Cardiovascular:     Rate and Rhythm: Normal rate and regular rhythm.     Heart sounds: Normal heart sounds.  Pulmonary:     Effort: Pulmonary effort is normal.     Breath sounds: Normal breath sounds.  Abdominal:     Palpations: Abdomen is soft.     Tenderness: There is no abdominal tenderness.  Neurological:     General: No focal deficit present.      Mental Status: She is lethargic.     Labs on Admission: I have personally reviewed following labs and imaging studies  CBC: Recent Labs  Lab 11/10/21 0102  WBC 5.5  NEUTROABS 2.2  HGB 11.7*  HCT 37.2  MCV 79.0*  PLT 967   Basic Metabolic Panel: Recent Labs  Lab 11/10/21 0102  NA 138  K 2.4*  CL 104  CO2 21*  GLUCOSE 123*  BUN 11  CREATININE 0.78  CALCIUM 9.2   GFR: CrCl cannot be calculated (Unknown ideal weight.). Liver Function Tests: Recent Labs  Lab 11/10/21 0102  AST 35  ALT 24  ALKPHOS 97  BILITOT 0.6  PROT 8.2*  ALBUMIN 3.8   No results for input(s): "LIPASE", "AMYLASE" in the last 168 hours. No results for input(s): "AMMONIA" in the last 168 hours. Coagulation Profile: No results for input(s): "INR", "PROTIME" in the last 168 hours. Cardiac Enzymes: Recent Labs  Lab 11/10/21 0102  CKTOTAL 45   BNP (last 3 results) No results for input(s): "PROBNP" in the last 8760 hours. HbA1C: No results for input(s): "HGBA1C" in the last 72 hours. CBG: No results for input(s): "GLUCAP" in the last 168 hours. Lipid Profile: No results for input(s): "CHOL", "HDL", "LDLCALC", "TRIG", "CHOLHDL", "LDLDIRECT" in the last 72 hours. Thyroid Function Tests: No results for input(s): "TSH", "T4TOTAL", "FREET4", "T3FREE", "THYROIDAB" in the last 72 hours. Anemia Panel: No results for input(s): "VITAMINB12", "FOLATE", "FERRITIN", "TIBC", "IRON", "RETICCTPCT" in the last 72 hours. Urine analysis:    Component Value Date/Time   COLORURINE YELLOW (A) 11/10/2021 0212   APPEARANCEUR CLOUDY (A) 11/10/2021 0212   LABSPEC 1.013 11/10/2021 0212   PHURINE 7.0 11/10/2021 0212   GLUCOSEU NEGATIVE 11/10/2021 0212   HGBUR NEGATIVE 11/10/2021 0212   BILIRUBINUR NEGATIVE 11/10/2021 0212   KETONESUR 20 (A) 11/10/2021 0212   PROTEINUR NEGATIVE 11/10/2021 0212   NITRITE NEGATIVE 11/10/2021 0212   LEUKOCYTESUR TRACE (A) 11/10/2021 0212    Radiological Exams on  Admission: CT Abdomen Pelvis Wo Contrast  Result Date: 11/10/2021 CLINICAL DATA:  Evaluate foreign body EXAM: CT ABDOMEN AND PELVIS WITHOUT CONTRAST TECHNIQUE: Multidetector CT imaging of the abdomen and pelvis was performed following the standard protocol without IV contrast. RADIATION DOSE REDUCTION: This exam was performed according to the departmental dose-optimization program which includes automated exposure control, adjustment of the mA and/or kV according to patient size and/or use of iterative reconstruction technique. COMPARISON:  None Available. FINDINGS: Lower chest: No acute findings Hepatobiliary: No focal hepatic abnormality. Gallbladder unremarkable. Pancreas: No focal abnormality or ductal dilatation. Spleen: No focal abnormality.  Normal size. Adrenals/Urinary Tract: Foley catheter within the bladder which is decompressed. No renal or adrenal mass. No stones or hydronephrosis. Stomach/Bowel: Catheter like structure seen within the rectum. Recommend clinical correlation. No additional foreign body seen within the bowel. Moderate stool burden. No bowel obstruction. Vascular/Lymphatic:  No evidence of aneurysm or adenopathy. Reproductive: Uterus and adnexa unremarkable.  No mass. Other: No free fluid or free air. Musculoskeletal: No acute bony abnormality. Postoperative and degenerative changes in the lumbar spine. IMPRESSION: Catheter like structure seen within the rectum. Recommend clinical correlation. Otherwise no radiopaque foreign body seen. No acute findings in the abdomen or pelvis. Electronically Signed   By: Charlett Nose M.D.   On: 11/10/2021 02:46   CT Head Wo Contrast  Result Date: 11/10/2021 CLINICAL DATA:  Found unresponsive EXAM: CT HEAD WITHOUT CONTRAST TECHNIQUE: Contiguous axial images were obtained from the base of the skull through the vertex without intravenous contrast. RADIATION DOSE REDUCTION: This exam was performed according to the departmental dose-optimization  program which includes automated exposure control, adjustment of the mA and/or kV according to patient size and/or use of iterative reconstruction technique. COMPARISON:  None Available. FINDINGS: Brain: No evidence of acute infarction, hemorrhage, hydrocephalus, extra-axial collection or mass lesion/mass effect. Vascular: No hyperdense vessel or unexpected calcification. Skull: Normal. Negative for fracture or focal lesion. Sinuses/Orbits: No acute finding. Other: None. IMPRESSION: No acute intracranial abnormality noted. Electronically Signed   By: Alcide Clever M.D.   On: 11/10/2021 02:43   DG Chest 1 View  Result Date: 11/10/2021 CLINICAL DATA:  unresponsive; poss fb EXAM: CHEST  1 VIEW COMPARISON:  09/14/2021 FINDINGS: Heart and mediastinal contours are within normal limits. No focal opacities or effusions. No acute bony abnormality. No radiopaque foreign body. IMPRESSION: No active cardiopulmonary disease. Electronically Signed   By: Charlett Nose M.D.   On: 11/10/2021 01:38     Data Reviewed: Relevant notes from primary care and specialist visits, past discharge summaries as available in EHR, including Care Everywhere. Prior diagnostic testing as pertinent to current admission diagnoses Updated medications and problem lists for reconciliation ED course, including vitals, labs, imaging, treatment and response to treatment Triage notes, nursing and pharmacy notes and ED provider's notes Notable results as noted in HPI   Assessment and Plan: * Acute metabolic encephalopathy Possibly related to substance withdrawal, given patient is now in jail Patient had alternating periods of agitation and unresponsiveness Received morphine in order to obtain CAT scan Neurologic checks, IV hydration Keep n.p.o. with fall and aspiration precautions  Lactic acidosis, SIRS, possible sepsis History of recurrent discitis Possible sepsis criteria include hypothermia and lactic acidosis Received cefepime and  Flagyl in the ED as well as IV fluids We will treat empirically as sepsis Follow procalcitonin  Hypothermia Possibly environmental Continue Bair hugger  Hypokalemia Repleted in the ED  Hepatitis C antibody positive in blood Follows with ID  Polysubstance dependence including opioid type drug, continuous use (HCC) UDS positive for cocaine and opiates Prior history of IVDU        DVT prophylaxis: Lovenox  Consults: none  Advance Care Planning:   Code Status: Prior   Family Communication: none  Disposition Plan: Back to previous home environment  Severity of Illness: The appropriate patient status for this patient is OBSERVATION. Observation status is judged to be reasonable and necessary in order to provide the required intensity of service to ensure the patient's safety. The patient's presenting symptoms, physical exam findings, and initial radiographic and laboratory data in the context of their medical condition is felt to place them at decreased risk for further clinical deterioration. Furthermore, it is anticipated that the patient will be medically stable for discharge from the hospital within 2 midnights of admission.   Author: Andris Baumann, MD 11/10/2021 4:14  AM  For on call review [email or web address].

## 2021-11-10 NOTE — ED Notes (Signed)
Ordered Mgso4 to come from pharmacy. Pt continues to sleep.

## 2021-11-10 NOTE — Assessment & Plan Note (Signed)
History of recurrent discitis Possible sepsis criteria include hypothermia and lactic acidosis Received cefepime and Flagyl in the ED as well as IV fluids We will treat empirically as sepsis Follow procalcitonin

## 2021-11-10 NOTE — ED Triage Notes (Signed)
Pt comes from jail via EMS after she was found unresponsive. Pt has known drug use. EMS reports pt was brady and nonresponsive on arrival. Narcan 1 mg administered enroute with no response.

## 2021-11-10 NOTE — Sepsis Progress Note (Signed)
[facility] following for Sepsis Protocol 

## 2021-11-10 NOTE — ED Notes (Signed)
Removed rectal probe.

## 2021-11-10 NOTE — Progress Notes (Signed)
Pharmacy Antibiotic Note  Susan Terry is a 46 y.o. female w/ PMH of bacteremia, history recurrent spinal infections, most recently mrsa bacteremia admitted on 11/10/2021 with sepsis.  Pharmacy has been consulted for vancomycin and cefepime dosing. She received a total of 1750 mg IV vancomycin in the ED  Plan:  1) start cefepime 2 grams IV every 8 hours  2) based on data from a recent admission start vancomycin 1000 mg IV every 12 hours Goal AUC 400-600 Expected AUC: 564.5 SCr used: 0.78 mg/dL Ke 0.0637, T1/2 10.9h     Temp (24hrs), Avg:97.4 F (36.3 C), Min:95 F (35 C), Max:98.7 F (37.1 C)  Recent Labs  Lab 11/10/21 0102 11/10/21 0515  WBC 5.5  --   CREATININE 0.78  --   LATICACIDVEN 4.4* 0.7    CrCl cannot be calculated (Unknown ideal weight.).    No Known Allergies  Antimicrobials this admission: 10/15 vancomycin >>  10/15 cefepime >>  Microbiology results: 10/15 BCx: pending  Thank you for allowing pharmacy to be a part of this patient's care.  Dallie Piles 11/10/2021 9:17 AM

## 2021-11-10 NOTE — ED Notes (Signed)
Pt resting quietly in bed.

## 2021-11-10 NOTE — ED Provider Notes (Signed)
[facility] Provider Note    Event Date/Time   First MD Initiated Contact with Patient 11/10/21 0049     (approximate)   History   Unresponsive   HPI  Level V caveat: Limited by unresponsive state  Susan Terry is a 46 y.o. female brought to the ED via EMS from jail with a chief complaint of unresponsive.  Patient with a history of polysubstance abuse who was found unresponsive in her cell.  She was on the floor but guards states the inmates usually sleep on the floor.  Arrives to the ED moving all extremities, flailing alternating with bouts of unresponsiveness, eyes rolled back into her head.  No history of seizure disorder.  Per report, patient had a previous scan at the jail which demonstrated possible foreign body.  No foreign body was noted on repeat scan.     Past Medical History   Past Medical History:  Diagnosis Date   Drug abuse (HCC)    Osteomyelitis [facility](HCC)      Active Problem List   Patient Active Problem List   Diagnosis Date Noted   Anxiety 09/18/2021   Hepatitis C antibody positive in blood 09/15/2021   Anemia 09/15/2021   Lumbar discitis, recurrent 09/14/2021   Polysubstance dependence including opioid type drug, continuous use (HCC)    Back pain 08/06/2021   Hypokalemia 08/06/2021   Bacteremia due to methicillin resistant Staphylococcus aureus 08/06/2021   Vagina, candidiasis    Leukocytosis    IVDU (intravenous drug user) 04/01/2020   Heroin abuse (HCC) 04/01/2020   Neck pain 04/01/2020   Cervical discitis 03/31/2020   Tobacco abuse 07/20/2018   Abscess of right forearm    MRSA cellulitis 04/06/2017     Past Surgical History   Past Surgical History:  Procedure Laterality Date   BACK SURGERY     INCISION AND DRAINAGE FOREARM / WRIST DEEP Right    LUMBAR LAMINECTOMY FOR EPIDURAL ABSCESS Left 08/16/2021   Procedure: LUMBAR LAMINECTOMY FOR EPIDURAL ABSCESS;  Surgeon: Venetia NightYarbrough, Chester, MD;  Location: [facility] ORS;   Service: Neurosurgery;  Laterality: Left;     Home Medications   Prior to Admission medications   Medication Sig Start Date End Date Taking? Authorizing Provider  acetaminophen (TYLENOL) 325 MG tablet Take 2 tablets (650 mg total) by mouth every 6 (six) hours as needed for mild pain (or Fever >/= 101). 09/17/21   Esaw GrandchildGriffith, Kelly A, DO  hydrOXYzine (VISTARIL) 50 MG capsule Take 1 capsule (50 mg total) by mouth 3 (three) times daily as needed. 09/18/21   Esaw GrandchildGriffith, Kelly A, DO  oxyCODONE (OXY IR/ROXICODONE) 5 MG immediate release tablet Take 1 tablet (5 mg total) by mouth every 4 (four) hours as needed for moderate pain. 09/17/21   Pennie BanterGriffith, Kelly A, DO     Allergies  Patient has no known allergies.   Family History   Family History  Problem Relation Age of Onset   Cancer Mother        cancer throughout family     Physical Exam  Triage Vital Signs: ED Triage Vitals  Enc Vitals Group     BP      Pulse      Resp      Temp      Temp src      SpO2      Weight      Height      Head Circumference      Peak Flow  Pain Score      Pain Loc      Pain Edu?      Excl. in GC?     Updated Vital Signs: BP 113/61   Pulse (!) 48   Temp (!) 96.8 F (36 C) (Rectal)   Resp (!) 22   LMP  (LMP Unknown)   SpO2 99%    General: Unresponsive, moderate distress.  CV:  Mild bradycardia.  Good peripheral perfusion.  Resp:  Normal effort.  CTA B. Abd:  No distention.  Other:  Alternating unresponsive and flailing all extremities.  Does not follow basic commands.  Diaphoretic.  Edentulous.   ED Results / Procedures / Treatments  Labs (all labs ordered are listed, but only abnormal results are displayed) Labs Reviewed  CBC WITH DIFFERENTIAL/PLATELET - Abnormal; Notable for the following components:      Result Value   Hemoglobin 11.7 (*)    MCV 79.0 (*)    MCH 24.8 (*)    All other components within normal limits  COMPREHENSIVE METABOLIC PANEL - Abnormal; Notable for the  following components:   Potassium 2.4 (*)    CO2 21 (*)    Glucose, Bld 123 (*)    Total Protein 8.2 (*)    All other components within normal limits  ACETAMINOPHEN LEVEL - Abnormal; Notable for the following components:   Acetaminophen (Tylenol), Serum <10 (*)    All other components within normal limits  SALICYLATE LEVEL - Abnormal; Notable for the following components:   Salicylate Lvl <7.0 (*)    All other components within normal limits  LACTIC ACID, PLASMA - Abnormal; Notable for the following components:   Lactic Acid, Venous 4.4 (*)    All other components within normal limits  URINALYSIS, ROUTINE W REFLEX MICROSCOPIC - Abnormal; Notable for the following components:   Color, Urine YELLOW (*)    APPearance CLOUDY (*)    Ketones, ur 20 (*)    Leukocytes,Ua TRACE (*)    Bacteria, UA RARE (*)    All other components within normal limits  URINE DRUG SCREEN, QUALITATIVE ([facility] ONLY) - Abnormal; Notable for the following components:   Cocaine Metabolite,Ur [place] POSITIVE (*)    Opiate, Ur Screen POSITIVE (*)    All other components within normal limits  CULTURE, BLOOD (SINGLE)  ETHANOL  CK  LACTIC ACID, PLASMA  BLOOD GAS, ARTERIAL  TROPONIN I (HIGH SENSITIVITY)  TROPONIN I (HIGH SENSITIVITY)     EKG  ED ECG REPORT I, Hajira Verhagen J, the attending physician, personally viewed and interpreted this ECG.   Date: 11/10/2021  EKG Time: 0055  Rate: 61  Rhythm: normal sinus rhythm  Axis: Normal  Intervals: QTc 539  ST&T Change: Nonspecific    RADIOLOGY I have independently visualized and interpreted patient's CT and chest x-ray as well as noted the radiology interpretation:  Chest x-ray: No acute cardiopulmonary process  CT head: No ICH  CT abdomen/pelvis: No FB; temp probe in rectum  Official radiology report(s): CT Abdomen Pelvis Wo Contrast  Result Date: 11/10/2021 CLINICAL DATA:  Evaluate foreign body EXAM: CT ABDOMEN AND PELVIS WITHOUT CONTRAST TECHNIQUE:  Multidetector CT imaging of the abdomen and pelvis was performed following the standard protocol without IV contrast. RADIATION DOSE REDUCTION: This exam was performed according to the departmental dose-optimization program which includes automated exposure control, adjustment of the mA and/or kV according to patient size and/or use of iterative reconstruction technique. COMPARISON:  None Available. FINDINGS: Lower chest: No acute findings Hepatobiliary: No  focal hepatic abnormality. Gallbladder unremarkable. Pancreas: No focal abnormality or ductal dilatation. Spleen: No focal abnormality.  Normal size. Adrenals/Urinary Tract: Foley catheter within the bladder which is decompressed. No renal or adrenal mass. No stones or hydronephrosis. Stomach/Bowel: Catheter like structure seen within the rectum. Recommend clinical correlation. No additional foreign body seen within the bowel. Moderate stool burden. No bowel obstruction. Vascular/Lymphatic: No evidence of aneurysm or adenopathy. Reproductive: Uterus and adnexa unremarkable.  No mass. Other: No free fluid or free air. Musculoskeletal: No acute bony abnormality. Postoperative and degenerative changes in the lumbar spine. IMPRESSION: Catheter like structure seen within the rectum. Recommend clinical correlation. Otherwise no radiopaque foreign body seen. No acute findings in the abdomen or pelvis. Electronically Signed   By: Charlett Nose M.D.   On: 11/10/2021 02:46   CT Head Wo Contrast  Result Date: 11/10/2021 CLINICAL DATA:  Found unresponsive EXAM: CT HEAD WITHOUT CONTRAST TECHNIQUE: Contiguous axial images were obtained from the base of the skull through the vertex without intravenous contrast. RADIATION DOSE REDUCTION: This exam was performed according to the departmental dose-optimization program which includes automated exposure control, adjustment of the mA and/or kV according to patient size and/or use of iterative reconstruction technique. COMPARISON:   None Available. FINDINGS: Brain: No evidence of acute infarction, hemorrhage, hydrocephalus, extra-axial collection or mass lesion/mass effect. Vascular: No hyperdense vessel or unexpected calcification. Skull: Normal. Negative for fracture or focal lesion. Sinuses/Orbits: No acute finding. Other: None. IMPRESSION: No acute intracranial abnormality noted. Electronically Signed   By: Alcide Clever M.D.   On: 11/10/2021 02:43   DG Chest 1 View  Result Date: 11/10/2021 CLINICAL DATA:  unresponsive; poss fb EXAM: CHEST  1 VIEW COMPARISON:  09/14/2021 FINDINGS: Heart and mediastinal contours are within normal limits. No focal opacities or effusions. No acute bony abnormality. No radiopaque foreign body. IMPRESSION: No active cardiopulmonary disease. Electronically Signed   By: Charlett Nose M.D.   On: 11/10/2021 01:38     PROCEDURES:  Critical Care performed: Yes, see critical care procedure note(s)  CRITICAL CARE Performed by: Irean Hong   Total critical care time: 45 minutes  Critical care time was exclusive of separately billable procedures and treating other patients.  Critical care was necessary to treat or prevent imminent or life-threatening deterioration.  Critical care was time spent personally by me on the following activities: development of treatment plan with patient and/or surrogate as well as nursing, discussions with consultants, evaluation of patient's response to treatment, examination of patient, obtaining history from patient or surrogate, ordering and performing treatments and interventions, ordering and review of laboratory studies, ordering and review of radiographic studies, pulse oximetry and re-evaluation of patient's condition.   Marland Kitchen1-3 Lead EKG Interpretation  Performed by: Irean Hong, MD Authorized by: Irean Hong, MD     Interpretation: abnormal     ECG rate:  54   ECG rate assessment: bradycardic     Rhythm: sinus bradycardia     Ectopy: none      Conduction: normal   Comments:     Patient placed on cardiac monitor to evaluate for arrhythmias    MEDICATIONS ORDERED IN ED: Medications  sodium chloride 0.9 % bolus 1,000 mL (1,000 mLs Intravenous New Bag/Given 11/10/21 0340)    And  sodium chloride 0.9 % bolus 1,000 mL (1,000 mLs Intravenous New Bag/Given 11/10/21 0256)    And  sodium chloride 0.9 % bolus 250 mL (has no administration in time range)  metroNIDAZOLE (FLAGYL) IVPB 500  mg (has no administration in time range)  vancomycin (VANCOCIN) IVPB 1000 mg/200 mL premix (has no administration in time range)  potassium chloride 10 mEq in 100 mL IVPB (has no administration in time range)  potassium chloride 10 mEq in 100 mL IVPB (has no administration in time range)  potassium chloride 10 mEq in 100 mL IVPB (has no administration in time range)  potassium chloride 10 mEq in 100 mL IVPB (10 mEq Intravenous New Bag/Given 11/10/21 0340)  vancomycin (VANCOREADY) IVPB 750 mg/150 mL (has no administration in time range)  morphine (PF) 4 MG/ML injection 4 mg (4 mg Intramuscular Given 11/10/21 0208)  ceFEPIme (MAXIPIME) 2 g in sodium chloride 0.9 % 100 mL IVPB (0 g Intravenous Stopped 11/10/21 0344)  ondansetron (ZOFRAN) injection 4 mg (4 mg Intravenous Given 11/10/21 0319)     IMPRESSION / MDM / ASSESSMENT AND PLAN / ED COURSE  I reviewed the triage vital signs and the nursing notes.                             46 year old female brought to the ED unresponsive from jail; history of polysubstance abuse. Differential diagnosis includes, but is not limited to, alcohol, illicit or prescription medications, or other toxic ingestion; intracranial pathology such as stroke or intracerebral hemorrhage; fever or infectious causes including sepsis; hypoxemia and/or hypercarbia; uremia; trauma; endocrine related disorders such as diabetes, hypoglycemia, and thyroid-related diseases; hypertensive encephalopathy; etc. I have personally reviewed patient's  records and note a hospitalization 09/14/2021 for lumbar discitis secondary to IV drug use.  Patient's presentation is most consistent with acute presentation with potential threat to life or bodily function.  The patient is on the cardiac monitor to evaluate for evidence of arrhythmia and/or significant heart rate changes.  We will obtain toxicological lab work/urine, CT head, abdomen/pelvis to evaluate for foreign body, i.e. ingested drugs.  Initiate IV fluid resuscitation.  Anticipate hospitalization.  Of note, guard from jail is with patient who requires forensic handcuffs on her wrist clasped to the bed.  Clinical Course as of 11/10/21 0346  Nancy Fetter Nov 10, 2021  0124 Patient is a difficult stick; laboratory present to draw blood.  She was able to wake up to tell the nurse that she did not take any substances. [JS]  0201 Rectal temperature 93 F; will apply Retail [employer].  Remains difficult IV stick; will consult IV team.  Will administer Morphine IM due to bouts of agitation so that patient may proceed to CT scan. [JS]  0210 WBC unremarkable 5.5, potassium 2.4, lactic acid 4.4; CK, troponin, toxicological levels unremarkable.  Will order rounds of IV potassium, initiate ED code sepsis orders. [JS]  9147 CT head is negative for ICH.  CT abdomen/pelvis shows catheter like structure in patient's rectum; will perform rectal exam.  UDS positive for cocaine and opiates (urine was taken prior to administration of IM morphine).  Have consulted hospitalist services for evaluation and admission. [JS]    Clinical Course User Index [JS] Paulette Blanch, MD     FINAL CLINICAL IMPRESSION(S) / ED DIAGNOSES   Final diagnoses:  Unresponsive  Hypokalemia  Dehydration  Cocaine abuse ([place])  Opiate overdose, accidental or unintentional, initial encounter [facility])     Rx / DC Orders   ED Discharge Orders     None        Note:  This document was prepared using Dragon voice recognition software and may  include unintentional  dictation errors.   Irean Hong, MD 11/10/21 [phone]

## 2021-11-10 NOTE — Progress Notes (Signed)
CODE SEPSIS - PHARMACY COMMUNICATION  **Broad Spectrum Antibiotics should be administered within 1 hour of Sepsis diagnosis**  Time Code Sepsis Called/Page Received:  10/15 @ 0212   Antibiotics Ordered: Cefepime, Vanc, metronidazole   Time of 1st antibiotic administration: Cefepime 2 gm IV X 1 on 10/15 @ 0300   Additional action taken by pharmacy:   If necessary, Name of Provider/Nurse Contacted:     Narada Uzzle D ,PharmD Clinical Pharmacist  11/10/2021  3:53 AM

## 2021-11-10 NOTE — ED Notes (Signed)
Pyxis out of IV potassium. Messaged pharmacy and corrected inventory count. Pt still needs 4th bag.

## 2021-11-10 NOTE — ED Notes (Signed)
Pt was hypothermic upon arrival so IV fluids were warmed and [employer] device placed. Rectal temp monitoring initiated as well

## 2021-11-10 NOTE — Assessment & Plan Note (Signed)
Follows with ID

## 2021-11-10 NOTE — Progress Notes (Addendum)
Interim progress note not for billing  67f hx ivdu, hx bacteremia, history recurrent spinal infections, most recently mrsa bacteremia, presenting from jail. Reports increased drug use the days prior to incarceration, was incarcerated beginning on Thursday. Denies drug [place] in prison. Injects fentanyl and cocaine. Reports feeling very hot and light headed, then not sure what happened. Arrived here encephalopathic and hypothermic. Received narcan en route. Currently feeling much improved, says low back hurts, more than usual but also chronic pain there. On exam no point tenderness on back, has superficial infection dorsum of right hand. AAOx3. Ddx includes drug intoxication, bacteremia/sepsis. Lactic acidosis has resolved. Remains hypokalemic so will replete that. Will remove foley. Nursing also to remove temp probe from anus which was seen on CT. Will continue fluids. Will update her mother. Will check ID labs and repeat blood cultures. Unfortunately only one set of  blood cultures was obtained on admission and this was obtained after patient received cefepime/flagyl. Given hx of osteo and report of worsening low back pain will check mri of lumbar spine. If cultures negative, no infection on mri, and remains clinically stable tomorrow, anticipate possible d/c then.  Addendum: gonorrhea positive, will give ceftriaxone IM

## 2021-11-10 NOTE — Assessment & Plan Note (Signed)
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

## 2021-11-10 NOTE — ED Notes (Signed)
IV team at bedside 

## 2021-11-10 NOTE — Progress Notes (Signed)
PHARMACY -  BRIEF ANTIBIOTIC NOTE   Pharmacy has received consult(s) for Vancomycin, Cefepime  from an ED provider.  The patient's profile has been reviewed for ht/wt/allergies/indication/available labs.    One time order(s) placed for Cefepime 2 gm IV X 1 and Vancomycin 1750 mg IV X 1.   Further antibiotics/pharmacy consults should be ordered by admitting physician if indicated.                       Thank you, Delron Comer D 11/10/2021  2:54 AM

## 2021-11-11 ENCOUNTER — Inpatient Hospital Stay: Payer: Self-pay

## 2021-11-11 ENCOUNTER — Encounter: Payer: Self-pay | Admitting: Internal Medicine

## 2021-11-11 ENCOUNTER — Other Ambulatory Visit: Payer: Self-pay

## 2021-11-11 DIAGNOSIS — A549 Gonococcal infection, unspecified: Secondary | ICD-10-CM

## 2021-11-11 LAB — BASIC METABOLIC PANEL
Anion gap: 2 — ABNORMAL LOW (ref 5–15)
BUN: 5 mg/dL — ABNORMAL LOW (ref 6–20)
CO2: 26 mmol/L (ref 22–32)
Calcium: 8.2 mg/dL — ABNORMAL LOW (ref 8.9–10.3)
Chloride: 114 mmol/L — ABNORMAL HIGH (ref 98–111)
Creatinine, Ser: 0.65 mg/dL (ref 0.44–1.00)
GFR, Estimated: >60 mL/min (ref >60)
Glucose, Bld: 115 mg/dL — ABNORMAL HIGH (ref 70–99)
Potassium: 3.3 mmol/L — ABNORMAL LOW (ref 3.5–5.1)
Sodium: 142 mmol/L (ref 135–145)

## 2021-11-11 LAB — CBC
HCT: 28.8 % — ABNORMAL LOW (ref 36.0–46.0)
Hemoglobin: 9 g/dL — ABNORMAL LOW (ref 12.0–15.0)
MCH: 25.4 pg — ABNORMAL LOW (ref 26.0–34.0)
MCHC: 31.3 g/dL (ref 30.0–36.0)
MCV: 81.1 fL (ref 80.0–100.0)
Platelets: 218 10*3/uL (ref 150–400)
RBC: 3.55 MIL/uL — ABNORMAL LOW (ref 3.87–5.11)
RDW: 14.6 % (ref 11.5–15.5)
WBC: 4.5 10*3/uL (ref 4.0–10.5)
nRBC: 0 % (ref 0.0–0.2)

## 2021-11-11 LAB — MAGNESIUM: Magnesium: 2 mg/dL (ref 1.7–2.4)

## 2021-11-11 LAB — RPR: RPR Ser Ql: NONREACTIVE

## 2021-11-11 LAB — MRSA NEXT GEN BY PCR, NASAL: MRSA by PCR Next Gen: DETECTED — AB

## 2021-11-11 MED ORDER — GADOBUTROL 1 MMOL/ML IV SOLN
7.0000 mL | Freq: Once | INTRAVENOUS | Status: AC | PRN
  Administered 2021-11-11: 7 mL via INTRAVENOUS

## 2021-11-11 NOTE — Discharge Summary (Signed)
Susan Terry EHM:[id number] DOB: 1975-03-26 DOA: 11/10/2021  PCP: Pcp, No  Admit date: 11/10/2021 Discharge date: 11/11/2021  Time spent: 35 minutes  Recommendations for Outpatient Follow-up:  Pcp f/u     Discharge Diagnoses:  Principal Problem:   Acute metabolic encephalopathy Active Problems:   Lactic acidosis, SIRS, possible sepsis   Hypothermia   Hypokalemia   Hepatitis C antibody positive in blood   Polysubstance dependence including opioid type drug, continuous use (HCC)   Tobacco abuse   IVDU (intravenous drug user)   AMS (altered mental status)   Gonorrhea   Discharge Condition: stable  Diet recommendation: regular  Filed Weights   11/10/21 0900  Weight: 68 kg    History of present illness:  From admission h and p Susan Terry is a 46 y.o. female with medical history significant for Polysubstance abuse, recurrent lumbar discitis, hepatitis C antibody positive, nicotine dependence, anxiety, who presents to the ED From jail with altered mental status after she was found unresponsive.  EMS reports that patient was bradycardic and nonresponsive to first administered dose of Narcan.  On arrival however she was intermittently very agitated flailing all of her extremities but then would fall back into unresponsiveness.  Hospital Course:  Patient presented encephalopathic and hypothermic with elevated lactic acid after being found unresponsive in her jail cell. She reports "overdoing it" with fentanyl and cocaine prior to her incarceration on 10/12 but denies drug use while incarcerated. She was given narcan by EMS. She was agitated while here initially, this promptly resolved and since then she says she feels normally. She did not display signs/symptoms of opioid withdrawal. Given history recurrent bacteremia/osteomyelitis and ongoing IVDU concern for bacteremia. Unfortunately only one set of blood cultures was obtained and these were obtained after start of  antibiotics (vanc/cefepime/flagyl; cefepime was later transitioned to ceftriaxone). Those blood cultures and repeat set are no growth. Patient did report low back pain that she says is chronic. That's the site of her most recent case of osteo so MRI was performed which did not show signs active infection. Testing did reveal gonorrhea which we treated with ceftriaxone. No pelvic pain and no signs of PID on CT. RPR is pending at time of discharge. Given prompt resolution of symptoms and no laboratory or imaging evidence of infection think safe to discharge.   Procedures: none   Consultations: none  Discharge Exam: Vitals:   11/11/21 0600 11/11/21 0830  BP: (!) 119/51 121/69  Pulse: 60 84  Resp: 19 16  Temp:  98.6 F (37 C)  SpO2: 97% 98%    General: NAD Cardiovascular: RRR Respiratory: CTAB  Discharge Instructions   Discharge Instructions     Diet - low sodium heart healthy   Complete by: As directed    Increase activity slowly   Complete by: As directed       Allergies as of 11/11/2021   No Known Allergies      Medication List     STOP taking these medications    oxyCODONE 5 MG immediate release tablet Commonly known as: Oxy IR/ROXICODONE       TAKE these medications    acetaminophen 325 MG tablet Commonly known as: TYLENOL Take 2 tablets (650 mg total) by mouth every 6 (six) hours as needed for mild pain (or Fever >/= 101).   hydrOXYzine 50 MG capsule Commonly known as: Vistaril Take 1 capsule (50 mg total) by mouth 3 (three) times daily as needed.  No Known Allergies    The results of significant diagnostics from this hospitalization (including imaging, microbiology, ancillary and laboratory) are listed below for reference.    Significant Diagnostic Studies: MR Lumbar Spine W Wo Contrast  Result Date: 11/11/2021 CLINICAL DATA:  46 year old female found unresponsive. History of drug use. Lumbar discitis osteomyelitis at L4-L5 and L5-S1 in  July. Status post L5-S1 laminectomy. EXAM: MRI LUMBAR SPINE WITHOUT AND WITH CONTRAST TECHNIQUE: Multiplanar and multiecho pulse sequences of the lumbar spine were obtained without and with intravenous contrast. CONTRAST:  77mL GADAVIST GADOBUTROL 1 MMOL/ML IV SOLN COMPARISON:  Lumbar MRI 09/14/2021 and earlier. CT Abdomen and Pelvis 11/10/2021. FINDINGS: Segmentation: Normal, the same numbering system on the August MRI and earlier. Alignment: Stable straightening and mild reversal of lumbar lordosis with underlying mild dextroconvex lumbar scoliosis. No new spondylolisthesis. Vertebrae: Postoperative L2-L3 fusion hardware artifact. Underlying generalized mild decreased T1 marrow signal throughout the visible spine and pelvis. Regressed but not fully resolved L4 and L5 vertebral body marrow edema, residual mostly at the former. Fluid and enhancement in the mostly obliterated L4-L5 disc space has also regressed but not fully resolved. Faint abnormal STIR signal and enhancement in the L5-S1 disc space appears stable, with only subtle right far lateral endplate erosions at that level by CT yesterday. No convincing S1 marrow edema. No other acute osseous abnormality identified. Intact visible sacrum and SI joints. Conus medullaris and cauda equina: Conus extends to the T12-L1 level. No lower spinal cord or conus signal abnormality. No abnormal intradural enhancement or dural thickening. Cauda equina nerve roots appear normal. Paraspinal and other soft tissues: No paraspinal fluid collection. No significant paraspinal phlegmon now. Negative visible other abdominal viscera. Disc levels: Visible lower thoracic levels through the chronically fused L2-L3 level appear stable with capacious spinal canal and no stenosis. L3-L4 disc degeneration, disc bulging, and mild to moderate facet hypertrophy is stable with no significant stenosis. Nearly obliterated L4-L5 disc space with stable circumferential endplate spurring and mild to  moderate posterior element hypertrophy. No significant spinal stenosis. Stable mild lateral recess and foraminal stenosis. Better preserved L5-S1 disc space with postoperative changes to the left lamina. Circumferential disc osteophyte complex and mild to moderate residual facet hypertrophy greater on the right appears stable from last month. Right side facet joint fluid has increased, but without suspicious marrow edema or enhancement there. No spinal stenosis. No lateral recess stenosis. Moderate bilateral L5 foraminal stenosis is stable. IMPRESSION: 1. Evidence of regressed lumbar discitis osteomyelitis at L4-L5 - possibly also L5-S1 - since July. Residual marrow edema primarily at L4. No convincing new lumbar spine infection or inflammation. No paraspinal abscess or phlegmon. 2. No significant lumbar spinal stenosis, and no progressive neural impingement since the August MRI. Multifactorial mild L4-L5 lateral recess and foraminal stenosis, and moderate L5-S1 foraminal stenosis. Electronically Signed   By: Odessa Fleming M.D.   On: 11/11/2021 05:09   CT Abdomen Pelvis Wo Contrast  Result Date: 11/10/2021 CLINICAL DATA:  Evaluate foreign body EXAM: CT ABDOMEN AND PELVIS WITHOUT CONTRAST TECHNIQUE: Multidetector CT imaging of the abdomen and pelvis was performed following the standard protocol without IV contrast. RADIATION DOSE REDUCTION: This exam was performed according to the departmental dose-optimization program which includes automated exposure control, adjustment of the mA and/or kV according to patient size and/or use of iterative reconstruction technique. COMPARISON:  None Available. FINDINGS: Lower chest: No acute findings Hepatobiliary: No focal hepatic abnormality. Gallbladder unremarkable. Pancreas: No focal abnormality or ductal dilatation. Spleen: No focal  abnormality.  Normal size. Adrenals/Urinary Tract: Foley catheter within the bladder which is decompressed. No renal or adrenal mass. No stones or  hydronephrosis. Stomach/Bowel: Catheter like structure seen within the rectum. Recommend clinical correlation. No additional foreign body seen within the bowel. Moderate stool burden. No bowel obstruction. Vascular/Lymphatic: No evidence of aneurysm or adenopathy. Reproductive: Uterus and adnexa unremarkable.  No mass. Other: No free fluid or free air. Musculoskeletal: No acute bony abnormality. Postoperative and degenerative changes in the lumbar spine. IMPRESSION: Catheter like structure seen within the rectum. Recommend clinical correlation. Otherwise no radiopaque foreign body seen. No acute findings in the abdomen or pelvis. Electronically Signed   By: Rolm Baptise M.D.   On: 11/10/2021 02:46   CT Head Wo Contrast  Result Date: 11/10/2021 CLINICAL DATA:  Found unresponsive EXAM: CT HEAD WITHOUT CONTRAST TECHNIQUE: Contiguous axial images were obtained from the base of the skull through the vertex without intravenous contrast. RADIATION DOSE REDUCTION: This exam was performed according to the departmental dose-optimization program which includes automated exposure control, adjustment of the mA and/or kV according to patient size and/or use of iterative reconstruction technique. COMPARISON:  None Available. FINDINGS: Brain: No evidence of acute infarction, hemorrhage, hydrocephalus, extra-axial collection or mass lesion/mass effect. Vascular: No hyperdense vessel or unexpected calcification. Skull: Normal. Negative for fracture or focal lesion. Sinuses/Orbits: No acute finding. Other: None. IMPRESSION: No acute intracranial abnormality noted. Electronically Signed   By: Inez Catalina M.D.   On: 11/10/2021 02:43   DG Chest 1 View  Result Date: 11/10/2021 CLINICAL DATA:  unresponsive; poss fb EXAM: CHEST  1 VIEW COMPARISON:  09/14/2021 FINDINGS: Heart and mediastinal contours are within normal limits. No focal opacities or effusions. No acute bony abnormality. No radiopaque foreign body. IMPRESSION: No  active cardiopulmonary disease. Electronically Signed   By: Rolm Baptise M.D.   On: 11/10/2021 01:38    Microbiology: Recent Results (from the past 240 hour(s))  Culture, blood (single)     Status: None (Preliminary result)   Collection Time: 11/10/21  5:15 AM   Specimen: BLOOD  Result Value Ref Range Status   Specimen Description BLOOD BLOOD RIGHT ARM  Final   Special Requests   Final    BOTTLES DRAWN AEROBIC AND ANAEROBIC Blood Culture results may not be optimal due to an excessive volume of blood received in culture bottles   Culture   Final    NO GROWTH 1 DAY Performed at [facility], [address], [place], [place] [address]    Report Status PENDING  Incomplete  Culture, blood (Routine X 2) w Reflex to ID Panel     Status: None (Preliminary result)   Collection Time: 11/10/21  9:02 AM   Specimen: BLOOD RIGHT ARM  Result Value Ref Range Status   Specimen Description BLOOD RIGHT ARM  Final   Special Requests   Final    BOTTLES DRAWN AEROBIC AND ANAEROBIC Blood Culture results may not be optimal due to an excessive volume of blood received in culture bottles   Culture   Final    NO GROWTH < 24 HOURS Performed at [facility], [address], [place], [place] [address]    Report Status PENDING  Incomplete  SARS Coronavirus 2 by RT PCR (hospital order, performed in [facility] hospital lab) *cepheid single result test* Anterior Nasal Swab     Status: None   Collection Time: 11/10/21  9:13 AM   Specimen: Anterior Nasal Swab  Result Value Ref Range  Status   SARS Coronavirus 2 by RT PCR NEGATIVE NEGATIVE Final    Comment: (NOTE) SARS-CoV-2 target nucleic acids are NOT DETECTED.  The SARS-CoV-2 RNA is generally detectable in upper and lower respiratory specimens during the acute phase of infection. The lowest concentration of SARS-CoV-2 viral copies this assay can detect is 250 copies / mL. A negative result does not preclude SARS-CoV-2 infection and should  not be used as the sole basis for treatment or other patient management decisions.  A negative result may occur with improper specimen collection / handling, submission of specimen other than nasopharyngeal swab, presence of viral mutation(s) within the areas targeted by this assay, and inadequate number of viral copies (<250 copies / mL). A negative result must be combined with clinical observations, patient history, and epidemiological information.  Fact Sheet for Patients:   RoadLapTop.co.za  Fact Sheet for Healthcare Providers: http://kim-miller.com/  This test is not yet approved or  cleared by the [place] FDA and has been authorized for detection and/or diagnosis of SARS-CoV-2 by FDA under an Emergency Use Authorization (EUA).  This EUA will remain in effect (meaning this test can be used) for the duration of the COVID-19 declaration under Section 564(b)(1) of the Act, 21 U.S.C. section 360bbb-3(b)(1), unless the authorization is terminated or revoked sooner.  Performed at [facility], [address] [place], [place] [address]   Chlamydia/NGC rt PCR [facility] only)     Status: Abnormal   Collection Time: 11/10/21  9:30 AM  Result Value Ref Range Status   Specimen source GC/Chlam URINE, RANDOM  Final   Chlamydia Tr NOT DETECTED NOT DETECTED Final   N gonorrhoeae DETECTED (A) NOT DETECTED Final    Comment: (NOTE) This CT/NG assay has not been evaluated in patients with a history of  hysterectomy. Performed at [facility], [address] [place], [place] [address]      Labs: Basic Metabolic Panel: Recent Labs  Lab 11/10/21 0102 11/10/21 0913 11/11/21 0407  NA 138  --  142  K 2.4*  --  3.3*  CL 104  --  114*  CO2 21*  --  26  GLUCOSE 123*  --  115*  BUN 11  --  5*  CREATININE 0.78  --  0.65  CALCIUM 9.2  --  8.2*  MG  --  1.3* 2.0   Liver Function Tests: Recent Labs  Lab 11/10/21 0102   AST 35  ALT 24  ALKPHOS 97  BILITOT 0.6  PROT 8.2*  ALBUMIN 3.8   No results for input(s): "LIPASE", "AMYLASE" in the last 168 hours. No results for input(s): "AMMONIA" in the last 168 hours. CBC: Recent Labs  Lab 11/10/21 0102 11/11/21 0407  WBC 5.5 4.5  NEUTROABS 2.2  --   HGB 11.7* 9.0*  HCT 37.2 28.8*  MCV 79.0* 81.1  PLT 274 218   Cardiac Enzymes: Recent Labs  Lab 11/10/21 0102  CKTOTAL 45   BNP: BNP (last 3 results) No results for input(s): "BNP" in the last 8760 hours.  ProBNP (last 3 results) No results for input(s): "PROBNP" in the last 8760 hours.  CBG: No results for input(s): "GLUCAP" in the last 168 hours.     Signed:  Silvano Bilis MD.  [facility] 11/11/2021, 9:05 AM

## 2021-11-13 LAB — BLOOD GAS, ARTERIAL
Acid-Base Excess: 1.6 mmol/L (ref 0.0–2.0)
Bicarbonate: 25.8 mmol/L (ref 20.0–28.0)
O2 Saturation: 96.4 %
Patient temperature: 37
pCO2 arterial: 38 mmHg (ref 32–48)
pH, Arterial: 7.44 (ref 7.35–7.45)
pO2, Arterial: 72 mmHg — ABNORMAL LOW (ref 83–108)

## 2021-11-15 LAB — CULTURE, BLOOD (ROUTINE X 2): Culture: NO GROWTH

## 2021-11-15 LAB — CULTURE, BLOOD (SINGLE): Culture: NO GROWTH
